# Patient Record
Sex: Male | Born: 1965
Health system: Southern US, Community
[De-identification: ages and names within clinical notes are randomized; demographics above are authoritative.]

## PROBLEM LIST (undated history)

## (undated) DIAGNOSIS — I10 Essential (primary) hypertension: Secondary | ICD-10-CM

## (undated) DIAGNOSIS — G8929 Other chronic pain: Secondary | ICD-10-CM

## (undated) DIAGNOSIS — N200 Calculus of kidney: Secondary | ICD-10-CM

## (undated) DIAGNOSIS — M109 Gout, unspecified: Secondary | ICD-10-CM

## (undated) HISTORY — DX: Essential (primary) hypertension: I10

## (undated) HISTORY — PX: TONSILLECTOMY AND ADENOIDECTOMY: SUR1326

## (undated) HISTORY — DX: Other chronic pain: G89.29

---

## 2010-06-15 ENCOUNTER — Emergency Department (HOSPITAL_COMMUNITY)
Admission: EM | Admit: 2010-06-15 | Discharge: 2010-06-15 | Disposition: A | Discharge status: Home / Self Care | Payer: Self-pay | Attending: Emergency Medicine | Admitting: Emergency Medicine

## 2010-06-15 DIAGNOSIS — J069 Acute upper respiratory infection, unspecified: Secondary | ICD-10-CM | POA: Insufficient documentation

## 2010-06-15 DIAGNOSIS — R059 Cough, unspecified: Secondary | ICD-10-CM | POA: Insufficient documentation

## 2010-06-15 DIAGNOSIS — R110 Nausea: Secondary | ICD-10-CM | POA: Insufficient documentation

## 2010-06-15 DIAGNOSIS — R05 Cough: Secondary | ICD-10-CM | POA: Insufficient documentation

## 2010-08-19 ENCOUNTER — Emergency Department (HOSPITAL_COMMUNITY): Payer: Self-pay

## 2010-08-19 ENCOUNTER — Emergency Department (HOSPITAL_COMMUNITY)
Admission: EM | Admit: 2010-08-19 | Discharge: 2010-08-19 | Disposition: A | Discharge status: Home / Self Care | Payer: Self-pay | Attending: Emergency Medicine | Admitting: Emergency Medicine

## 2010-08-19 DIAGNOSIS — R079 Chest pain, unspecified: Secondary | ICD-10-CM | POA: Insufficient documentation

## 2010-08-19 DIAGNOSIS — J40 Bronchitis, not specified as acute or chronic: Secondary | ICD-10-CM | POA: Insufficient documentation

## 2010-08-19 DIAGNOSIS — F411 Generalized anxiety disorder: Secondary | ICD-10-CM | POA: Insufficient documentation

## 2010-08-19 DIAGNOSIS — M109 Gout, unspecified: Secondary | ICD-10-CM | POA: Insufficient documentation

## 2010-08-19 DIAGNOSIS — M546 Pain in thoracic spine: Secondary | ICD-10-CM | POA: Insufficient documentation

## 2010-08-19 LAB — CBC
HCT: 45 % (ref 39.0–52.0)
Hemoglobin: 15.4 g/dL (ref 13.0–17.0)
MCH: 29.8 pg (ref 26.0–34.0)
RBC: 5.17 MIL/uL (ref 4.22–5.81)

## 2010-08-19 LAB — DIFFERENTIAL
Basophils Relative: 0 % (ref 0–1)
Lymphocytes Relative: 29 % (ref 12–46)
Lymphs Abs: 2.2 10*3/uL (ref 0.7–4.0)
Monocytes Relative: 6 % (ref 3–12)
Neutro Abs: 4.7 10*3/uL (ref 1.7–7.7)
Neutrophils Relative %: 63 % (ref 43–77)

## 2010-08-19 LAB — COMPREHENSIVE METABOLIC PANEL
ALT: 37 U/L (ref 0–53)
Alkaline Phosphatase: 74 U/L (ref 39–117)
BUN: 16 mg/dL (ref 6–23)
CO2: 28 mEq/L (ref 19–32)
Calcium: 9.5 mg/dL (ref 8.4–10.5)
GFR calc Af Amer: >60 mL/min (ref >60)
GFR calc non Af Amer: >60 mL/min (ref >60)
Glucose, Bld: 117 mg/dL — ABNORMAL HIGH (ref 70–99)
Sodium: 136 mEq/L (ref 135–145)

## 2010-08-19 LAB — CK TOTAL AND CKMB (NOT AT ARMC): Relative Index: INVALID (ref 0.0–2.5)

## 2013-10-09 ENCOUNTER — Ambulatory Visit (INDEPENDENT_AMBULATORY_CARE_PROVIDER_SITE_OTHER): Payer: BC Managed Care – PPO | Admitting: Licensed Clinical Social Worker

## 2013-10-09 DIAGNOSIS — F331 Major depressive disorder, recurrent, moderate: Secondary | ICD-10-CM

## 2013-10-09 DIAGNOSIS — F411 Generalized anxiety disorder: Secondary | ICD-10-CM

## 2013-10-16 ENCOUNTER — Ambulatory Visit (INDEPENDENT_AMBULATORY_CARE_PROVIDER_SITE_OTHER): Payer: BC Managed Care – PPO | Admitting: Licensed Clinical Social Worker

## 2013-10-16 DIAGNOSIS — F411 Generalized anxiety disorder: Secondary | ICD-10-CM

## 2013-10-16 DIAGNOSIS — F331 Major depressive disorder, recurrent, moderate: Secondary | ICD-10-CM

## 2013-11-06 ENCOUNTER — Ambulatory Visit (INDEPENDENT_AMBULATORY_CARE_PROVIDER_SITE_OTHER): Payer: BC Managed Care – PPO | Admitting: Licensed Clinical Social Worker

## 2013-11-06 DIAGNOSIS — F411 Generalized anxiety disorder: Secondary | ICD-10-CM

## 2013-11-06 DIAGNOSIS — F331 Major depressive disorder, recurrent, moderate: Secondary | ICD-10-CM

## 2013-11-17 ENCOUNTER — Ambulatory Visit: Payer: BC Managed Care – PPO | Admitting: Family

## 2013-11-20 ENCOUNTER — Ambulatory Visit: Payer: BC Managed Care – PPO | Admitting: Licensed Clinical Social Worker

## 2014-11-15 ENCOUNTER — Encounter: Payer: Self-pay | Admitting: Family Medicine

## 2014-11-15 NOTE — Progress Notes (Signed)
Office Note 11/15/2014  CC: No chief complaint on file.  HPI:  William Vincent is a 49 y.o.  male who is here to establish care. Patient's most recent primary MD:  Old records were not reviewed prior to or during today's visit.  No past medical history on file.  No past surgical history on file.  No family history on file.  Social History   Social History   Marital Status: Single    Spouse Name: N/A   Number of Children: N/A   Years of Education: N/A   Occupational History   Not on file.   Social History Main Topics   Smoking status: Not on file   Smokeless tobacco: Not on file   Alcohol Use: Not on file   Drug Use: Not on file   Sexual Activity: Not on file   Other Topics Concern   Not on file   Social History Narrative   No narrative on file    No outpatient encounter prescriptions on file as of 11/15/2014.   No facility-administered encounter medications on file as of 11/15/2014.    Allergies not on file  ROS  PE; There were no vitals taken for this visit.  Pertinent labs:    ASSESSMENT AND PLAN:   No problem-specific assessment & plan notes found for this encounter.   No Follow-up on file.

## 2015-04-15 ENCOUNTER — Encounter (HOSPITAL_COMMUNITY): Payer: Self-pay | Admitting: Emergency Medicine

## 2015-04-15 ENCOUNTER — Emergency Department (HOSPITAL_COMMUNITY)
Admission: EM | Admit: 2015-04-15 | Discharge: 2015-04-15 | Disposition: A | Discharge status: Home / Self Care | Payer: Worker's Compensation | Attending: Emergency Medicine | Admitting: Emergency Medicine

## 2015-04-15 DIAGNOSIS — M79604 Pain in right leg: Secondary | ICD-10-CM | POA: Diagnosis present

## 2015-04-15 DIAGNOSIS — M5416 Radiculopathy, lumbar region: Secondary | ICD-10-CM | POA: Insufficient documentation

## 2015-04-15 DIAGNOSIS — Z87442 Personal history of urinary calculi: Secondary | ICD-10-CM | POA: Diagnosis not present

## 2015-04-15 HISTORY — DX: Calculus of kidney: N20.0

## 2015-04-15 HISTORY — DX: Gout, unspecified: M10.9

## 2015-04-15 MED ORDER — KETOROLAC TROMETHAMINE 60 MG/2ML IM SOLN
60.0000 mg | Freq: Once | INTRAMUSCULAR | Status: AC
  Administered 2015-04-15: 60 mg via INTRAMUSCULAR
  Filled 2015-04-15: qty 2

## 2015-04-15 MED ORDER — MELOXICAM 7.5 MG PO TABS: 7.5000 mg | ORAL_TABLET | Freq: Every day | ORAL | Status: DC

## 2015-04-15 MED ORDER — PREDNISONE 10 MG (21) PO TBPK
10.0000 mg | ORAL_TABLET | Freq: Every day | ORAL | Status: DC
Start: 2015-04-15 — End: 2015-04-22

## 2015-04-15 MED ORDER — METHOCARBAMOL 500 MG PO TABS: 500.0000 mg | ORAL_TABLET | Freq: Three times a day (TID) | ORAL | Status: DC | PRN

## 2015-04-15 NOTE — ED Provider Notes (Signed)
CSN: [id number]     Arrival date & time 04/15/15  1452 History  By signing my name below, I, Octavia Heir, attest that this documentation has been prepared under the direction and in the presence of Linn Goetze, PA-C. Electronically Signed: Octavia Heir, ED Scribe. 04/15/2015. 4:45 PM.    Chief Complaint  Patient presents with  . Leg Pain      The history is provided by the patient. No language interpreter was used.   HPI Comments: William Vincent is a 50 y.o. male who presents to the Emergency Department complaining of a constant, gradual worsening, moderate, tingling right leg pain with associated right sided back soreness onset this afternoon. 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 The tingling is located around the right hip into the right anterior thigh only.  Pt ambulates with a limp and reports tingling when he walks. He has not taken any medication to alleviate the pain. Denies numbness, weakness, abdominal pain, bowel/bladder incontinence, fevers, chills, and hx of back problems.  Past Medical History  Diagnosis Date  . Gout   . Kidney stones    Past Surgical History  Procedure Laterality Date  . Tonsillectomy and adenoidectomy     No family history on file. Social History  Substance Use Topics  . Smoking status: Never Smoker   . Smokeless tobacco: Not on file  . Alcohol Use: No    Review of Systems  Constitutional: Negative for fever and chills.  Gastrointestinal: Negative for abdominal pain.  Musculoskeletal: Positive for back pain.  Neurological: Negative for weakness and numbness.  All other systems reviewed and are negative.     Allergies  Review of patient's allergies indicates no known allergies.  Home Medications   Prior to Admission medications   Not on File   Triage vitals: BP 134/104 mmHg  Pulse 90  Temp(Src) 98.1 F  (36.7 C) (Oral)  Resp 18  SpO2 99% Physical Exam  Constitutional: He appears well-developed and well-nourished. No distress.  HENT:  Head: Normocephalic and atraumatic.  Neck: Neck supple.  Pulmonary/Chest: Effort normal.  Abdominal: Soft. He exhibits no distension. There is no tenderness. There is no rebound and no guarding.  Musculoskeletal:       Back:  Spine nontender, no crepitus, or stepoffs. Lower extremities:  Strength 5/5, sensation intact, distal pulses intact. Tenderness of right lower lumbar area.  No skin changes.    Neurological: He is alert.  Skin: He is not diaphoretic.  Nursing note and vitals reviewed.   ED Course  Procedures  DIAGNOSTIC STUDIES: Oxygen Saturation is 99% on RA, normal by my interpretation.  COORDINATION OF CARE:  4:44 PM Discussed treatment plan with pt at bedside and pt agreed to plan.  Labs Review Labs Reviewed - No data to display  Imaging Review No results found. I have personally reviewed and evaluated these images and lab results as part of my medical decision-making.   EKG Interpretation None      MDM   Final diagnoses:  Lumbar radiculopathy, acute    Afebrile, nontoxic patient with right lower back pain and tingling around right hip into right anterior thigh.  No weakness.  No bony tenderness of spine.  Suspect muscle strain vs pinched nerve, possible disc bulge.  Doubt significant nerve root impingement, acute cord syndrome, fracture.   D/C home with mobic, robaxin, prednisone,  close PCP follow up.   Discussed result, findings, treatment, and follow up  with patient.  Pt given return precautions.  Pt verbalizes understanding and agrees with plan.        I personally performed the services described in this documentation, which was scribed in my presence. The recorded information has been reviewed and is accurate.   Trixie Dredge, PA-C 04/15/15 2000  Arby Barrette, MD 04/22/15 [phone]

## 2015-04-15 NOTE — ED Notes (Signed)
Pt states he was lifting something heavy at work and now has R sided upper leg pain. Alert and oriented.

## 2015-04-15 NOTE — Progress Notes (Signed)
EDCM consulted to assist with primary care resources.  EDCM spoke to patient at bedside.  Patient reports he has [employer] and presented [facility] with his insurance card.  EDCM provided patient with list of pcps who accept [employer] insurance within a ten mile radius of patient's zip code.  Patient thankful for services.  No further EDCM needs at this time.

## 2015-04-15 NOTE — Discharge Instructions (Signed)
Read the information below.  Use the prescribed medication as directed.  Please discuss all new medications with your pharmacist.  You may return to the Emergency Department at any time for worsening condition or any new symptoms that concern you.     If you develop fevers, loss of control of bowel or bladder, weakness or numbness in your legs, or are unable to walk, return to the ER for a recheck.   Please follow up with your primary care provider as soon as possible for a follow up appointment.      Lumbosacral Radiculopathy Lumbosacral radiculopathy is a condition that involves the spinal nerves and nerve roots in the low back and bottom of the spine. The condition develops when these nerves and nerve roots move out of place or become inflamed and cause symptoms. CAUSES This condition may be caused by:  Pressure from a disk that bulges out of place (herniated disk). A disk is a plate of cartilage that separates bones in the spine.  Disk degeneration.  A narrowing of the bones of the lower back (spinal stenosis).  A tumor.  An infection.  An injury that places sudden pressure on the disks that cushion the bones of your lower spine. RISK FACTORS This condition is more likely to develop in:  Males aged 30-50 years.  Females aged 50-60 years.  People who lift improperly.  People who are overweight or live a sedentary lifestyle.  People who smoke.  People who perform repetitive activities that strain the spine. SYMPTOMS Symptoms of this condition include:  Pain that goes down from the back into the legs (sciatica). This is the most common symptom. The pain may be worse with sitting, coughing, or sneezing.  Pain and numbness in the arms and legs.  Muscle weakness.  Tingling.  Loss of bladder control or bowel control. DIAGNOSIS This condition is diagnosed with a physical exam and medical history. If the pain is lasting, you may have tests, such as:  MRI  scan.  X-ray.  CT scan.  Myelogram.  Nerve conduction study. TREATMENT This condition is often treated with:  Hot packs and ice applied to affected areas.  Stretches to improve flexibility.  Exercises to strengthen back muscles.  Physical therapy.  Pain medicine.  A steroid injection in the spine. In some cases, no treatment is needed. If the condition is long-lasting (chronic), or if symptoms are severe, treatment may involve surgery or lifestyle changes, such as following a weight loss plan. HOME CARE INSTRUCTIONS Medicines  Take medicines only as directed by your health care provider.  Do not drive or operate heavy machinery while taking pain medicine. Injury Care  Apply a heat pack to the injured area as directed by your health care provider.  Apply ice to the affected area:  Put ice in a plastic bag.  Place a towel between your skin and the bag.  Leave the ice on for 20-30 minutes, every 2 hours while you are awake or as needed. Or, leave the ice on for as long as directed by your health care provider. Other Instructions  If you were shown how to do any exercises or stretches, do them as directed by your health care provider.  If your health care provider prescribed a diet or exercise program, follow it as directed.  Keep all follow-up visits as directed by your health care provider. This is important. SEEK MEDICAL CARE IF:  Your pain does not improve over time even when taking pain medicines.  SEEK IMMEDIATE MEDICAL CARE IF:  Your develop severe pain.  Your pain suddenly gets worse.  You develop increasing weakness in your legs.  You lose the ability to control your bladder or bowel.  You have difficulty walking or balancing.  You have a fever.   This information is not intended to replace advice given to you by your health care provider. Make sure you discuss any questions you have with your health care provider.   Document Released: 02/23/2005  Document Revised: 07/10/2014 Document Reviewed: 02/19/2014 Elsevier Interactive Patient Education [employer]

## 2015-04-16 ENCOUNTER — Ambulatory Visit (INDEPENDENT_AMBULATORY_CARE_PROVIDER_SITE_OTHER): Payer: BLUE CROSS/BLUE SHIELD | Admitting: Family Medicine

## 2015-04-16 ENCOUNTER — Encounter: Payer: Self-pay | Admitting: Family Medicine

## 2015-04-16 VITALS — BP 174/95 | HR 89 | Wt 210.0 lb

## 2015-04-16 DIAGNOSIS — M5416 Radiculopathy, lumbar region: Secondary | ICD-10-CM

## 2015-04-16 DIAGNOSIS — R030 Elevated blood-pressure reading, without diagnosis of hypertension: Secondary | ICD-10-CM | POA: Diagnosis not present

## 2015-04-16 DIAGNOSIS — IMO0001 Reserved for inherently not codable concepts without codable children: Secondary | ICD-10-CM

## 2015-04-16 MED ORDER — HYDROCODONE-ACETAMINOPHEN 5-325 MG PO TABS: 1.0000 | ORAL_TABLET | Freq: Four times a day (QID) | ORAL | Status: DC | PRN

## 2015-04-16 NOTE — Patient Instructions (Signed)
Thank you for coming in today. Come back or go to the emergency room if you notice new weakness new numbness problems walking or bowel or bladder problems. Return sooner if needed.  Return on Friday.   Get fasting labs.   Sciatica Sciatica is pain, weakness, numbness, or tingling along the path of the sciatic nerve. The nerve starts in the lower back and runs down the back of each leg. The nerve controls the muscles in the lower leg and in the back of the knee, while also providing sensation to the back of the thigh, lower leg, and the sole of your foot. Sciatica is a symptom of another medical condition. For instance, nerve damage or certain conditions, such as a herniated disk or bone spur on the spine, pinch or put pressure on the sciatic nerve. This causes the pain, weakness, or other sensations normally associated with sciatica. Generally, sciatica only affects one side of the body. CAUSES   Herniated or slipped disc.  Degenerative disk disease.  A pain disorder involving the narrow muscle in the buttocks (piriformis syndrome).  Pelvic injury or fracture.  Pregnancy.  Tumor (rare). SYMPTOMS  Symptoms can vary from mild to very severe. The symptoms usually travel from the low back to the buttocks and down the back of the leg. Symptoms can include:  Mild tingling or dull aches in the lower back, leg, or hip.  Numbness in the back of the calf or sole of the foot.  Burning sensations in the lower back, leg, or hip.  Sharp pains in the lower back, leg, or hip.  Leg weakness.  Severe back pain inhibiting movement. These symptoms may get worse with coughing, sneezing, laughing, or prolonged sitting or standing. Also, being overweight may worsen symptoms. DIAGNOSIS  Your caregiver will perform a physical exam to look for common symptoms of sciatica. He or she may ask you to do certain movements or activities that would trigger sciatic nerve pain. Other tests may be performed to  find the cause of the sciatica. These may include:  Blood tests.  X-rays.  Imaging tests, such as an MRI or CT scan. TREATMENT  Treatment is directed at the cause of the sciatic pain. Sometimes, treatment is not necessary and the pain and discomfort goes away on its own. If treatment is needed, your caregiver may suggest:  Over-the-counter medicines to relieve pain.  Prescription medicines, such as anti-inflammatory medicine, muscle relaxants, or narcotics.  Applying heat or ice to the painful area.  Steroid injections to lessen pain, irritation, and inflammation around the nerve.  Reducing activity during periods of pain.  Exercising and stretching to strengthen your abdomen and improve flexibility of your spine. Your caregiver may suggest losing weight if the extra weight makes the back pain worse.  Physical therapy.  Surgery to eliminate what is pressing or pinching the nerve, such as a bone spur or part of a herniated disk. HOME CARE INSTRUCTIONS   Only take over-the-counter or prescription medicines for pain or discomfort as directed by your caregiver.  Apply ice to the affected area for 20 minutes, 3-4 times a day for the first 48-72 hours. Then try heat in the same way.  Exercise, stretch, or perform your usual activities if these do not aggravate your pain.  Attend physical therapy sessions as directed by your caregiver.  Keep all follow-up appointments as directed by your caregiver.  Do not wear high heels or shoes that do not provide proper support.  Check your mattress  to see if it is too soft. A firm mattress may lessen your pain and discomfort. SEEK IMMEDIATE MEDICAL CARE IF:   You lose control of your bowel or bladder (incontinence).  You have increasing weakness in the lower back, pelvis, buttocks, or legs.  You have redness or swelling of your back.  You have a burning sensation when you urinate.  You have pain that gets worse when you lie down or  awakens you at night.  Your pain is worse than you have experienced in the past.  Your pain is lasting longer than 4 weeks.  You are suddenly losing weight without reason. MAKE SURE YOU:  Understand these instructions.  Will watch your condition.  Will get help right away if you are not doing well or get worse.   This information is not intended to replace advice given to you by your health care provider. Make sure you discuss any questions you have with your health care provider.   Document Released: 02/17/2001 Document Revised: 11/14/2014 Document Reviewed: 07/05/2011 Elsevier Interactive Patient Education [employer]

## 2015-04-17 NOTE — Assessment & Plan Note (Signed)
Patient has symptoms consistent with right-sided lumbar radiculopathy. His exam is abnormal slightly with a change in reflexes and some clonus. He may have a higher lumbar compression that would explain his symptoms. He seems to be doing well and his symptoms have not worsened acutely. Plan for careful discussion of red flag signs or symptoms, continued steroid management, and hydrocodone. Follow-up in 3 days. If not improving or if worsening would consider getting an MRI at that time.

## 2015-04-17 NOTE — Progress Notes (Signed)
       William Vincent is a 50 y.o. male who presents to [facility]: Primary Care today for establish care and discuss right-sided back pain and leg pain. Patient notes a 2 or 3 day history of back pain radiating to the right anterior thigh. He notes some paresthesias into the right foot. He denies significant weakness bowel bladder dysfunction fevers chills nausea vomiting or diarrhea. He was seen in the emergency department on February 6 where he was prescribed meloxicam, Robaxin, and prednisone. This has helped only a little. 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 He notes the pain started after he lifted a heavy object but denies any specific injury.   Past Medical History  Diagnosis Date  . Gout   . Kidney stones   . Hypertension    Past Surgical History  Procedure Laterality Date  . Tonsillectomy and adenoidectomy     Social History  Substance Use Topics  . Smoking status: Never Smoker   . Smokeless tobacco: Not on file  . Alcohol Use: No   family history is not on file.  ROS as above Medications: Current Outpatient Prescriptions  Medication Sig Dispense Refill  . meloxicam (MOBIC) 7.5 MG tablet Take 1 tablet (7.5 mg total) by mouth daily. 14 tablet 0  . methocarbamol (ROBAXIN) 500 MG tablet Take 1-2 tablets (500-1,000 mg total) by mouth every 8 (eight) hours as needed for muscle spasms (and pain). 20 tablet 0  . predniSONE (STERAPRED UNI-PAK 21 TAB) 10 MG (21) TBPK tablet Take 1 tablet (10 mg total) by mouth daily. Day 1: take 6 tabs.  Day 2: 5 tabs  Day 3: 4 tabs  Day 4: 3 tabs  Day 5: 2 tabs  Day 6: 1 tab 21 tablet 0  . HYDROcodone-acetaminophen (NORCO/VICODIN) 5-325 MG tablet Take 1 tablet by mouth every 6 (six) hours as needed. 15 tablet 0   No current facility-administered medications for this visit.   No Known Allergies   Exam:  BP 174/95 mmHg  Pulse 89  Wt 210 lb (95.255  kg) Gen: Well NAD HEENT: EOMI,  MMM Lungs: Normal work of breathing. CTABL Heart: RRR no MRG Abd: NABS, Soft. Nondistended, Nontender Exts: Brisk capillary refill, warm and well perfused.  Back is nontender to spinal midline. Mildly tender to palpation right SI joint. Lower extremity sensation is intact throughout. Knee reflexes are equal and normal bilaterally. Ankle reflexes are increased with a few beats of clonus on the right compared to the left. Strength is preserved and intact throughout. Patient can stand and his toes heels squat get on and off exam table and has a normal gait. Positive right-sided slumped test.  No results found for this or any previous visit (from the past 24 hour(s)). No results found.   Please see individual assessment and plan sections.

## 2015-04-17 NOTE — Assessment & Plan Note (Signed)
Blood pressure is elevated likely due to pain and meloxicam and prednisone. Plan to obtain routine fasting labs and recheck in a few days.

## 2015-04-18 ENCOUNTER — Encounter: Payer: Self-pay | Admitting: Family Medicine

## 2015-04-18 ENCOUNTER — Ambulatory Visit (INDEPENDENT_AMBULATORY_CARE_PROVIDER_SITE_OTHER): Payer: BLUE CROSS/BLUE SHIELD | Admitting: Family Medicine

## 2015-04-18 VITALS — BP 147/87 | HR 80 | Wt 216.0 lb

## 2015-04-18 DIAGNOSIS — M5416 Radiculopathy, lumbar region: Secondary | ICD-10-CM | POA: Diagnosis not present

## 2015-04-18 LAB — COMPREHENSIVE METABOLIC PANEL
ALBUMIN: 3.7 g/dL (ref 3.6–5.1)
ALT: 10 U/L (ref 9–46)
AST: 10 U/L (ref 10–40)
Alkaline Phosphatase: 53 U/L (ref 40–115)
BILIRUBIN TOTAL: 0.4 mg/dL (ref 0.2–1.2)
BUN: 16 mg/dL (ref 7–25)
CHLORIDE: 101 mmol/L (ref 98–110)
CO2: 24 mmol/L (ref 20–31)
CREATININE: 1.02 mg/dL (ref 0.60–1.35)
Calcium: 8.8 mg/dL (ref 8.6–10.3)
Glucose, Bld: 83 mg/dL (ref 65–99)
Potassium: 4.5 mmol/L (ref 3.5–5.3)
SODIUM: 138 mmol/L (ref 135–146)
Total Protein: 6.4 g/dL (ref 6.1–8.1)

## 2015-04-18 LAB — CBC
HCT: 44.4 % (ref 39.0–52.0)
HEMOGLOBIN: 14.5 g/dL (ref 13.0–17.0)
MCH: 28.8 pg (ref 26.0–34.0)
MCHC: 32.7 g/dL (ref 30.0–36.0)
MCV: 88.3 fL (ref 78.0–100.0)
MPV: 9.7 fL (ref 8.6–12.4)
PLATELETS: 293 10*3/uL (ref 150–400)
RBC: 5.03 MIL/uL (ref 4.22–5.81)
RDW: 14.9 % (ref 11.5–15.5)
WBC: 11.4 10*3/uL — AB (ref 4.0–10.5)

## 2015-04-18 LAB — TSH: TSH: 2.44 m[IU]/L (ref 0.40–4.50)

## 2015-04-18 LAB — LIPID PANEL
Cholesterol: 164 mg/dL (ref 125–200)
HDL: 57 mg/dL (ref >=40)
LDL CALC: 88 mg/dL (ref <130)
Total CHOL/HDL Ratio: 2.9 Ratio (ref <=5.0)
Triglycerides: 95 mg/dL (ref <150)
VLDL: 19 mg/dL (ref <30)

## 2015-04-18 MED ORDER — LORAZEPAM 0.5 MG PO TABS
ORAL_TABLET | ORAL | Status: DC
Start: 2015-04-18 — End: 2015-04-24

## 2015-04-18 NOTE — Progress Notes (Addendum)
       William Vincent is a 50 y.o. male who presents to [name] today for low back pain.  Patient seen 2 days ago for lumbago radiating to the R anterior thigh with R sided clonus thought to be due to R sided lumbar radiculopathy. Since then, patient has had worsening pain despite taking anti-inflammatories and pain medication. He noted bilateral cyanosis of his feet this AM which prompted coming in. He denies bowel and bladder issues. Denies numbness and paresthesias.    Past Medical History  Diagnosis Date  . Gout   . Kidney stones   . Hypertension    Past Surgical History  Procedure Laterality Date  . Tonsillectomy and adenoidectomy     Social History  Substance Use Topics  . Smoking status: Never Smoker   . Smokeless tobacco: Not on file  . Alcohol Use: No   family history is not on file.  ROS as above Medications: Current Outpatient Prescriptions  Medication Sig Dispense Refill  . HYDROcodone-acetaminophen (NORCO/VICODIN) 5-325 MG tablet Take 1 tablet by mouth every 6 (six) hours as needed. 15 tablet 0  . meloxicam (MOBIC) 7.5 MG tablet Take 1 tablet (7.5 mg total) by mouth daily. 14 tablet 0  . methocarbamol (ROBAXIN) 500 MG tablet Take 1-2 tablets (500-1,000 mg total) by mouth every 8 (eight) hours as needed for muscle spasms (and pain). 20 tablet 0  . predniSONE (STERAPRED UNI-PAK 21 TAB) 10 MG (21) TBPK tablet Take 1 tablet (10 mg total) by mouth daily. Day 1: take 6 tabs.  Day 2: 5 tabs  Day 3: 4 tabs  Day 4: 3 tabs  Day 5: 2 tabs  Day 6: 1 tab 21 tablet 0   No current facility-administered medications for this visit.   No Known Allergies   Exam:  BP 147/87 mmHg  Pulse 80  Wt 216 lb (97.977 kg) Gen: Uncomfortable appearing gentleman in NAD HEENT: EOMI,  MMM Lungs: Normal work of breathing. CTABL Heart: RRR no MRG Abd: NABS, Soft. Nondistended, Nontender Exts:  Brisk capillary refill, warm and well perfused. PT and DP pulses 2+ bilaterally.   MSK No visual or palpable deformity of back. Nontender.   Full ROM of LE Strength 5/5 bilaterally with thigh flexion and extension, abduction/adduction. Plantarflexion and standing on heels while standing 5/5 strength  Reflexes equal and normal bilateral knees and ankles. Negative for clonus bilaterally though some R foot twitching.  Antalgic gait favoring the left side.   No results found for this or any previous visit (from the past 24 hour(s)). No results found.   Please see individual assessment and plan sections.    LUMBAR MRI NORMAL

## 2015-04-18 NOTE — Patient Instructions (Addendum)
Thank you for coming in today. You were seen today for continued back pain and weakness. We will order an MRI to look at your lower back. Please come back after the MRI.  We will call with results.

## 2015-04-18 NOTE — Assessment & Plan Note (Addendum)
Acute, worsening. Patient is doing worse since being seen two days ago. Next step is lumbar MRI to evaluate for spinal cord or alternative pathology causing the current problems. Follow up after MRI.

## 2015-04-19 ENCOUNTER — Ambulatory Visit: Payer: Self-pay | Admitting: Family Medicine

## 2015-04-19 LAB — HEMOGLOBIN A1C
HEMOGLOBIN A1C: 5.4 % (ref <5.7)
MEAN PLASMA GLUCOSE: 108 mg/dL (ref <117)

## 2015-04-19 LAB — VITAMIN D 25 HYDROXY (VIT D DEFICIENCY, FRACTURES): VIT D 25 HYDROXY: 15 ng/mL — AB (ref 30–100)

## 2015-04-19 NOTE — Addendum Note (Signed)
Addended by: Rodolph Bong on: 04/19/2015 12:35 PM   Modules accepted: Kipp Brood

## 2015-04-19 NOTE — Progress Notes (Signed)
Quick Note:  Vitamin D deficiency noted. Take 2000 units of vitamin D daily over-the-counter.   ______ 

## 2015-04-22 ENCOUNTER — Ambulatory Visit (INDEPENDENT_AMBULATORY_CARE_PROVIDER_SITE_OTHER): Payer: BLUE CROSS/BLUE SHIELD

## 2015-04-22 ENCOUNTER — Ambulatory Visit (INDEPENDENT_AMBULATORY_CARE_PROVIDER_SITE_OTHER): Payer: Self-pay | Admitting: Family Medicine

## 2015-04-22 ENCOUNTER — Encounter: Payer: Self-pay | Admitting: Family Medicine

## 2015-04-22 VITALS — BP 141/96 | HR 77 | Wt 214.0 lb

## 2015-04-22 DIAGNOSIS — M5030 Other cervical disc degeneration, unspecified cervical region: Secondary | ICD-10-CM

## 2015-04-22 DIAGNOSIS — M5416 Radiculopathy, lumbar region: Secondary | ICD-10-CM

## 2015-04-22 MED ORDER — BACLOFEN 10 MG PO TABS: 10.0000 mg | ORAL_TABLET | Freq: Three times a day (TID) | ORAL | Status: DC

## 2015-04-22 NOTE — Assessment & Plan Note (Signed)
Unclear etiology. Possible cervical thoracic spine cause. Patient initially had some clonus which may be due to myelopathy in the cervical spine. We'll obtain a cervical spine MRI. Additionally will refer to physical therapy. Continue regimen. Follow-up in a few days. Red flag signs or symptoms reviewed with patient who expresses understanding and agreement. Switched baclofen.

## 2015-04-22 NOTE — Progress Notes (Signed)
       William Vincent is a 50 y.o. male who presents to [name] Care today for follow-up back pain. Patient was seen last week with severe back pain. He had pain radiating to his right leg with weakness. In the interim he's had a lumbar MRI that was completely normal. His symptoms persisted and have worsened. He notes continued right leg weakness. He uses a cane to ambulate. He denies any bowel or bladder dysfunction. He continues the below medication. He notes that the Robaxin has not been helpful and would like to try different muscle relaxer.   Past Medical History  Diagnosis Date  . Gout   . Kidney stones   . Hypertension    Past Surgical History  Procedure Laterality Date  . Tonsillectomy and adenoidectomy     Social History  Substance Use Topics  . Smoking status: Never Smoker   . Smokeless tobacco: Not on file  . Alcohol Use: No   family history is not on file.  ROS as above Medications: Current Outpatient Prescriptions  Medication Sig Dispense Refill  . baclofen (LIORESAL) 10 MG tablet Take 1 tablet (10 mg total) by mouth 3 (three) times daily. 30 each 0  . HYDROcodone-acetaminophen (NORCO/VICODIN) 5-325 MG tablet Take 1 tablet by mouth every 6 (six) hours as needed. 15 tablet 0  . LORazepam (ATIVAN) 0.5 MG tablet 1-2 tabs 30-60 mins prior to mri 2 tablet 0  . meloxicam (MOBIC) 7.5 MG tablet Take 1 tablet (7.5 mg total) by mouth daily. 14 tablet 0   No current facility-administered medications for this visit.   No Known Allergies   Exam:  BP 141/96 mmHg  Pulse 77  Wt 214 lb (97.07 kg) Gen: Well NAD Back nontender to midline. Tender to palpation bilateral lumbar paraspinal muscles. Significantly limited back range of motion due to pain. Positive right-sided slump test negative left. Lower extremity reflexes are equal and normal bilaterally knees and ankles. No  clonus. Lower extremity strength is intact. Patient is able to stand on toes heels andsquat. He walks with a cane. Sensation is intact throughout. Neck nontender. Normal flexion and extension. Impaired lateral flexion bilaterally. Negative Spurling's test. Back pain exacerbated by neck motion. Upper extremity strength sensation reflexes are equal and normal bilaterally.  No results found for this or any previous visit (from the past 24 hour(s)). No results found.   Please see individual assessment and plan sections.

## 2015-04-22 NOTE — Patient Instructions (Signed)
Thank you for coming in today. Return later this week following MRI.  Take baclofen for muscle spasm as needed.

## 2015-04-23 NOTE — Progress Notes (Signed)
Quick Note:  MRI cspine looks ok ______

## 2015-04-24 ENCOUNTER — Ambulatory Visit (INDEPENDENT_AMBULATORY_CARE_PROVIDER_SITE_OTHER): Payer: BLUE CROSS/BLUE SHIELD | Admitting: Rehabilitative and Restorative Service Providers"

## 2015-04-24 ENCOUNTER — Ambulatory Visit (INDEPENDENT_AMBULATORY_CARE_PROVIDER_SITE_OTHER): Payer: BLUE CROSS/BLUE SHIELD | Admitting: Family Medicine

## 2015-04-24 ENCOUNTER — Encounter: Payer: Self-pay | Admitting: Family Medicine

## 2015-04-24 ENCOUNTER — Ambulatory Visit (INDEPENDENT_AMBULATORY_CARE_PROVIDER_SITE_OTHER): Payer: BLUE CROSS/BLUE SHIELD

## 2015-04-24 ENCOUNTER — Encounter: Payer: Self-pay | Admitting: Rehabilitative and Restorative Service Providers"

## 2015-04-24 VITALS — BP 138/92 | HR 72 | Wt 214.0 lb

## 2015-04-24 DIAGNOSIS — M2560 Stiffness of unspecified joint, not elsewhere classified: Secondary | ICD-10-CM

## 2015-04-24 DIAGNOSIS — M79604 Pain in right leg: Secondary | ICD-10-CM

## 2015-04-24 DIAGNOSIS — Z7409 Other reduced mobility: Secondary | ICD-10-CM

## 2015-04-24 DIAGNOSIS — M25551 Pain in right hip: Secondary | ICD-10-CM | POA: Diagnosis not present

## 2015-04-24 DIAGNOSIS — R293 Abnormal posture: Secondary | ICD-10-CM

## 2015-04-24 DIAGNOSIS — M623 Immobility syndrome (paraplegic): Secondary | ICD-10-CM | POA: Diagnosis not present

## 2015-04-24 DIAGNOSIS — M5416 Radiculopathy, lumbar region: Secondary | ICD-10-CM | POA: Diagnosis not present

## 2015-04-24 DIAGNOSIS — M25559 Pain in unspecified hip: Secondary | ICD-10-CM | POA: Insufficient documentation

## 2015-04-24 DIAGNOSIS — R531 Weakness: Secondary | ICD-10-CM

## 2015-04-24 MED ORDER — HYDROCODONE-ACETAMINOPHEN 10-325 MG PO TABS: 1.0000 | ORAL_TABLET | Freq: Three times a day (TID) | ORAL | Status: DC | PRN

## 2015-04-24 MED ORDER — LORAZEPAM 0.5 MG PO TABS: ORAL_TABLET | ORAL | Status: DC

## 2015-04-24 NOTE — Patient Instructions (Signed)
Take norco for severe pain.  Get hip MRI.  Return following MRI with image CD.  Call or go to the ER if you develop a large red swollen joint with extreme pain or oozing puss.

## 2015-04-24 NOTE — Progress Notes (Signed)
William Vincent is a 50 y.o. male who presents to [name]: Primary Care today for follow-up leg pain. Patient has had back pain and right leg radiating pain for the last several days. He has been worked up so far with this with a lumbar MRI at [facility] that was normal and a cervical MRI that was also relatively normal. His symptoms persist and are severe. He notes severe right low back pain and right groin pain. The pain radiates down the anterior medial thigh down the anterior and medial lower leg and into the plantar foot. He notes numbness of both feet especially when he sits. He denies any new weakness or bowel bladder dysfunction. He has run out of Norco for which he takes for pain. No fevers or chills vomiting or diarrhea.   Past Medical History  Diagnosis Date  . Gout   . Kidney stones   . Hypertension    Past Surgical History  Procedure Laterality Date  . Tonsillectomy and adenoidectomy     Social History  Substance Use Topics  . Smoking status: Never Smoker   . Smokeless tobacco: Not on file  . Alcohol Use: No   family history is not on file.  ROS as above Medications: Current Outpatient Prescriptions  Medication Sig Dispense Refill  . baclofen (LIORESAL) 10 MG tablet Take 1 tablet (10 mg total) by mouth 3 (three) times daily. 30 each 0  . LORazepam (ATIVAN) 0.5 MG tablet 1-2 tabs 30-60 mins prior to mri 2 tablet 0  . HYDROcodone-acetaminophen (NORCO) 10-325 MG tablet Take 1 tablet by mouth every 8 (eight) hours as needed. 40 tablet 0   No current facility-administered medications for this visit.   No Known Allergies   Exam:  BP 138/92 mmHg  Pulse 72  Wt 214 lb (97.07 kg) Gen: Well NAD Back: Nontender to spinal midline. Tender palpation right SI joint and right lumbar paraspinals. Decreased motion due to pain especially in flexion and extension. Hip tender to palpation  anterior groin without any obvious herniation. Pain with motion of the hip including flexion and extension. Capillary refill and sensation intact lower extremities. Patient has an antalgic gait but can stand on his toes heels and get on and off exam table. He interlaced with a cane. No clonus on today's exam.      Procedure: Real-time Ultrasound Guided Attempted diagnostic and therapeutic injection of right femoral acetabular joint Device: GE Logiq E  Images permanently stored and available for review in the ultrasound unit. Verbal informed consent obtained. Discussed risks and benefits of procedure. Warned about infection bleeding damage to structures skin hypopigmentation and fat atrophy among others. Patient expresses understanding and agreement Time-out conducted.  Noted no overlying erythema, induration, or other signs of local infection.  Skin prepped in a sterile fashion.  Local anesthesia: Topical Ethyl chloride.  With sterile technique and under real time ultrasound guidance:A spinal needle was advanced to the femoral acetabular joint. Patient experienced a significant amount of pain with exam was unable to tolerate the injection. The procedure was discontinued at this time.    Advised to call if fevers/chills, erythema, induration, drainage, or persistent bleeding.  Images permanently stored and available for review in the ultrasound unit.  Impression: Discontinued ultrasound guided injection due to pain.    No results found for this or any previous visit (from the past 24 hour(s)). [name]  04/22/2015  CLINICAL DATA:  Neck pain and  bilateral shoulder pain and hand pain. EXAM: MRI CERVICAL SPINE WITHOUT CONTRAST TECHNIQUE: Multiplanar, multisequence MR imaging of the cervical spine was performed. No intravenous contrast was administered. COMPARISON:  None. FINDINGS: The visualized intracranial structures and paraspinal soft tissues are normal. The  cervical spinal cord is normal with no mass lesion or myelopathy or spinal cord compression. There is no spinal or foraminal stenosis.  No facet arthritis. Craniocervical junction through C2-3:  Normal. C3-4:  Minimal desiccation of the disc.  Otherwise normal. C4-5:  Normal. C5-6:  Tiny broad-based disc bulge with no neural impingement. C6-7:  Tiny central disc bulge with no neural impingement. C7-T1:  Tiny central disc bulge with no neural impingement. T1-2:  Normal. IMPRESSION: Minimal degenerative disc disease at several levels with no disc protrusions or neural impingement. Electronically Signed   By: Francene Boyers M.D.   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 Osseous mineralization normal. No acute fracture, dislocation or bone destruction. Pelvis unremarkable. IMPRESSION: Normal exam. Electronically Signed   By: Ulyses Southward M.D.   On: 04/24/2015 10:49     Please see individual assessment and plan sections.

## 2015-04-24 NOTE — Assessment & Plan Note (Signed)
X-ray normal. MRI pending. Unclear etiology of patient's hip groin and lower extremity pain. It does not seem to be lumbar radiculopathy because patient has normal lumbar MRI at [facility]. He clearly has pain in his pelvis and I suspect he may have a hip or hip muscle rotator problem to explain his pain. He may have compression of the sciatic nerve in the hip or pelvis. Follow-up after MRI. Prescribed Norco for pain control.

## 2015-04-24 NOTE — Therapy (Signed)
[facility] [place] [address] [place], [name], [address] Phone: [phone]   Fax:  [phone]  Physical Therapy Evaluation  Patient Details  Name: William Vincent MRN: [id number] Date of Birth: December 28, 1965 Referring Provider: Dr. [name]  Encounter Date: 04/24/2015      PT End of Session - 04/24/15 0809    Visit Number 1   Number of Visits 12   Date for PT Re-Evaluation 06/05/15   PT Start Time 0809   PT Stop Time 0902   PT Time Calculation (min) 53 min   Activity Tolerance Patient limited by pain      Past Medical History  Diagnosis Date  . Gout   . Kidney stones   . Hypertension     Past Surgical History  Procedure Laterality Date  . Tonsillectomy and adenoidectomy      There were no vitals filed for this visit.  Visit Diagnosis:  Lumbar radiculopathy, acute - Plan: PT plan of care cert/re-cert  Leg pain, right - Plan: PT plan of care cert/re-cert  Stiffness due to immobility - Plan: PT plan of care cert/re-cert  Abnormal posture - Plan: PT plan of care cert/re-cert  Decreased strength, endurance, and mobility - Plan: PT plan of care cert/re-cert      Subjective Assessment - 04/24/15 0811    Subjective Patient reports that he was picking up boxes weighing ~100 lb 04/15/15 when he felt pain back and into groin. Seen by MD in ED and was treated with medicine. He was then seen by Dr. [name] 04/16/15. He did MRI on back and neck which was negative. Pain continues and seems worse in the past few days with throbbing down the back of his leg.    Pertinent History Denies any medical problems or prior back injuries or problems   How long can you sit comfortably? not at all   How long can you stand comfortably? 5-10 min    How long can you walk comfortably? not at all   Diagnostic tests MRI   Patient Stated Goals Get back into shape - get rid of pain; get back and legs right   Currently in Pain? Yes   Pain Score 7     Pain Location Back   Pain Orientation Right   Pain Descriptors / Indicators Sharp   Pain Type Acute pain   Pain Radiating Towards posterior hip/buttocks; calf; knee    Pain Onset 1 to 4 weeks ago   Pain Frequency Constant   Aggravating Factors  prolonged sitting; standing; walking; leaning; propping in bed; reaching; lifting; bending   Pain Relieving Factors meds help "a little bit" clams it down for a while            [facility] PT Assessment - 04/24/15 0001    Assessment   Medical Diagnosis Rt lumbar radiculopathy    Referring Provider Dr. [name]   Onset Date/Surgical Date 04/15/15   Hand Dominance Right   Next MD Visit 04/24/15   Prior Therapy none   Precautions   Precautions None   Balance Screen   Has the patient fallen in the past 6 months No   Has the patient had a decrease in activity level because of a fear of falling?  No   Is the patient reluctant to leave their home because of a fear of falling?  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    Leisure yard work; Psychologist, forensic for kids; Catering manager in soft couch    Observation/Other Assessments   Focus on Therapeutic Outcomes (FOTO)  100% limitation    Sensation   Additional Comments tingling into the Rt foot and toes - sitting on toilet both feet will feel numb    Posture/Postural Control   Posture Comments stands with trunk forward flexed; weight shifted to the Lt; head forward; shoulders rounded; UE's in IR at sides   AROM   Overall AROM Comments pain with all movements of bilat LE's and trunk Rt LE > Lt LE    Right/Left Hip --   limited 90d flex bilat with c/o of increased LBP Rt>Lt   Lumbar Flexion 40%   Lumbar Extension 0%   Lumbar - Right Side Bend 45%   Lumbar - Left Side Bend 40%   Lumbar - Right Rotation 5%   Lumbar - Left Rotation 5%   Strength   Overall Strength Comments unable to fully assess strength due to pain in the LB and into the Rt hip/LE functional against gravity    Flexibility   Hamstrings tight Rt 42 deg; Lt 48 deg   Palpation   Spinal mobility hypersensitive to spring testing- unable to assess   Palpation comment hypersensitity to light pressure lumbar to bilat hips and thighs posteriorly   Special Tests    Special Tests --  SLR (+) Rt with pain in the Rt LB and into the Rt post thigh   Ambulation/Gait   Gait Comments antalgic gait with SPC Lt UE with weight shifted to the Lt during standing and with ambulation. Rt LE is externally rotated and hips flexed/trunk flexed forward in standing                   [facility] Adult PT Treatment/Exercise - 04/24/15 0001    Neuro Re-ed    Neuro Re-ed Details  working on upright posture and alignment; working on lying prone - flat    Lumbar Exercises: Stretches   Standing Extension --  3 reps 2-3 sec    Cryotherapy   Number Minutes Cryotherapy 15 Minutes   Cryotherapy Location Lumbar Spine;Hip  Rt   Type of Cryotherapy Ice pack   Electrical Stimulation   Electrical Stimulation Location bilat L5 area; Rt hip    Electrical Stimulation Action IFC   Electrical Stimulation Parameters to tolerance   Electrical Stimulation Goals Pain                PT Education - 04/24/15 0844    Education provided Yes   Education Details nature of mysculoskeletal injuries; improtance of good posture and alignment; HEP; TENS info   Person(s) Educated Patient   Methods Explanation;Demonstration;Tactile cues;Verbal cues;Handout   Comprehension Verbalized understanding;Returned demonstration;Verbal cues required;Tactile cues required           PT Short Term Goals - 04/24/15 0913    PT SHORT TERM GOAL #1   Title Decrease pain by 50-70% allowing pt to particiate in exercise and ADL's 05/15/15   Time 3   Period Weeks   Status New   PT SHORT TERM GOAL #2  Title Improve posture and alignment with pt to demonstrate good upright posture and alignment 05/15/15   Time 3   Period Weeks   PT SHORT TERM GOAL #3   Title Ambulate with improve gait pattern without assistive device 05/15/15   Time 3   Period Weeks   Status New           PT Long Term Goals - 04/24/15 0915    PT LONG TERM GOAL #1   Title Improve trunk and LE ROM/mobility to [facility] 06/05/15   Time 6   Period Weeks   Status New   PT LONG TERM GOAL #2   Title 5/5 strength with min to no pain bilat LE's 06/05/15   Time 6   Period Weeks   Status New   PT LONG TERM GOAL #3   Title Pt to demonstrate good body mechanics for changing positions and for lifting techniques 06/04/15   Time 6   Period Weeks   Status New   PT LONG TERM GOAL #4   Title Patient I in HEP 06/04/15   Time 6   Period Weeks   Status New   PT LONG TERM GOAL #5   Title Improve FOTO to </= 53% limitation 06/04/15   Time 6   Period Weeks   Status New               Plan - 04/24/15 0908    Clinical Impression Statement Patient presents with acute LBP with Rt LE radiculal pain following lifting boxes 04/15/15. He has poor posture and alignment; extremely limited trunk and LE mobility/ROM; pain with any palpation; limited functional activity level with inability to rest, work or engage in normal functional activites. Pt witll benefit form PT to address problems identified.    Pt will benefit from skilled therapeutic intervention in order to improve on the following deficits Postural dysfunction;Improper body mechanics;Decreased range of motion;Decreased mobility;Abnormal gait;Pain;Decreased endurance;Decreased activity tolerance   Rehab Potential Good   PT Frequency 2x / week   PT Duration 6 weeks    PT Treatment/Interventions Patient/family education;ADLs/Self Care Home Management;Neuromuscular re-education;Therapeutic exercise;Therapeutic activities;Cryotherapy;Electrical Stimulation;Ultrasound;Manual techniques;Dry needling   PT Next Visit Plan further assessment of strength and ROM as tolerated; progress wtih mobility and core stability exercises; education re spine care    PT Home Exercise Plan spine care ed initiated; provided info for TENS unit; HEP   Consulted and Agree with Plan of Care Patient         Problem List Patient Active Problem List   Diagnosis Date Noted  . Acute right lumbar radiculopathy 04/16/2015  . Elevated blood pressure 04/16/2015    Harleigh Civello Rober Minion PT, MPH  04/24/2015, 9:21 AM  [facility] [address] [place], [name], [address] Phone: [phone]   Fax:  [phone]  Name: Nephi Savage MRN: [id number] Date of Birth: 01/19/1966

## 2015-04-24 NOTE — Patient Instructions (Signed)
Avoid being bent forward Avoid twisting Avoid sitting When sitting try to stay straight and not bent forward or leaning towards one side  Trunk Extension    Standing, place back of open hands on low back. Straighten spine then arch the back and move shoulders back. Hold 2-3 seconds Repeat _3-4___ times per session. Do __several__ times/day   TENS UNIT: This is helpful for muscle pain and spasm.   Search and Purchase a TENS 7000 2nd edition at www.tenspros.com. It should be less than $30.     TENS unit instructions: Do not shower or bathe with the unit on Turn the unit off before removing electrodes or batteries If the electrodes lose stickiness add a drop of water to the electrodes after they are disconnected from the unit and place on plastic sheet. If you continued to have difficulty, call the TENS unit company to purchase more electrodes. Do not apply lotion on the skin area prior to use. Make sure the skin is clean and dry as this will help prolong the life of the electrodes. After use, always check skin for unusual red areas, rash or other skin difficulties. If there are any skin problems, does not apply electrodes to the same area. Never remove the electrodes from the unit by pulling the wires. Do not use the TENS unit or electrodes other than as directed. Do not change electrode placement without consultating your therapist or physician. Keep 2 fingers with between each electrode.      Sleeping on Back  Place pillow under knees. A pillow with cervical support and a roll around waist are also helpful. Copyright  VHI. All rights reserved.  Sleeping on Side Place pillow between knees. Use cervical support under neck and a roll around waist as needed. Copyright  VHI. All rights reserved.   Sleeping on Stomach   If this is the only desirable sleeping position, place pillow under lower legs, and under stomach or chest as needed.  Posture - Sitting   Sit upright,  head facing forward. Try using a roll to support lower back. Keep shoulders relaxed, and avoid rounded back. Keep hips level with knees. Avoid crossing legs for long periods. Stand to Sit / Sit to Stand   To sit: Bend knees to lower self onto front edge of chair, then scoot back on seat. To stand: Reverse sequence by placing one foot forward, and scoot to front of seat. Use rocking motion to stand up.   Work Height and Reach  Ideal work height is no more than 2 to 4 inches below elbow level when standing, and at elbow level when sitting. Reaching should be limited to arm's length, with elbows slightly bent.  Bending  Bend at hips and knees, not back. Keep feet shoulder-width apart.    Posture - Standing   Good posture is important. Avoid slouching and forward head thrust. Maintain curve in low back and align ears over shoul- ders, hips over ankles.  Alternating Positions   Alternate tasks and change positions frequently to reduce fatigue and muscle tension. Take rest breaks. Computer Work   Position work to Art gallery manager. Use proper work and seat height. Keep shoulders back and down, wrists straight, and elbows at right angles. Use chair that provides full back support. Add footrest and lumbar roll as needed.  Getting Into / Out of Car  Lower self onto seat, scoot back, then bring in one leg at a time. Reverse sequence to get out.  Dressing  Lorenz Coaster  on back to pull socks or slacks over feet, or sit and bend leg while keeping back straight.    Housework - Sink  Place one foot on ledge of cabinet under sink when standing at sink for prolonged periods.   Pushing / Pulling  Pushing is preferable to pulling. Keep back in proper alignment, and use leg muscles to do the work.  Deep Squat   Squat and lift with both arms held against upper trunk. Tighten stomach muscles without holding breath. Use smooth movements to avoid jerking.  Avoid Twisting   Avoid twisting or bending  back. Pivot around using foot movements, and bend at knees if needed when reaching for articles.  Carrying Luggage   Distribute weight evenly on both sides. Use a cart whenever possible. Do not twist trunk. Move body as a unit.   Lifting Principles .Maintain proper posture and head alignment. .Slide object as close as possible before lifting. .Move obstacles out of the way. .Test before lifting; ask for help if too heavy. .Tighten stomach muscles without holding breath. .Use smooth movements; do not jerk. .Use legs to do the work, and pivot with feet. .Distribute the work load symmetrically and close to the center of trunk. .Push instead of pull whenever possible.   Ask For Help   Ask for help and delegate to others when possible. Coordinate your movements when lifting together, and maintain the low back curve.  Log Roll   Lying on back, bend left knee and place left arm across chest. Roll all in one movement to the right. Reverse to roll to the left. Always move as one unit. Housework - Sweeping  Use long-handled equipment to avoid stooping.   Housework - Wiping  Position yourself as close as possible to reach work surface. Avoid straining your back.  Laundry - Unloading Wash   To unload small items at bottom of washer, lift leg opposite to arm being used to reach.  Gardening - Raking  Move close to area to be raked. Use arm movements to do the work. Keep back straight and avoid twisting.     Cart  When reaching into cart with one arm, lift opposite leg to keep back straight.   Getting Into / Out of Bed  Lower self to lie down on one side by raising legs and lowering head at the same time. Use arms to assist moving without twisting. Bend both knees to roll onto back if desired. To sit up, start from lying on side, and use same move-ments in reverse. Housework - Vacuuming  Hold the vacuum with arm held at side. Step back and forth to move it, keeping head up.  Avoid twisting.   Laundry - Armed forces training and education officer so that bending and twisting can be avoided.   Laundry - Unloading Dryer  Squat down to reach into clothes dryer or use a reacher.  Gardening - Weeding / Psychiatric nurse or Kneel. Knee pads may be helpful.

## 2015-04-25 NOTE — Progress Notes (Signed)
Quick Note:  Xray pelvis does not show a lot of arthritis ______

## 2015-04-26 ENCOUNTER — Encounter: Payer: Self-pay | Admitting: Rehabilitative and Restorative Service Providers"

## 2015-04-26 ENCOUNTER — Telehealth: Payer: Self-pay | Admitting: Family Medicine

## 2015-04-26 NOTE — Telephone Encounter (Signed)
Hip MRI showed inflammation of the muscles around the hip joint this is probably the cause of the pain and is likely due to muscle strain.

## 2015-04-26 NOTE — Telephone Encounter (Signed)
Left detailed with results and recommendations.

## 2015-04-29 ENCOUNTER — Ambulatory Visit (INDEPENDENT_AMBULATORY_CARE_PROVIDER_SITE_OTHER): Payer: BLUE CROSS/BLUE SHIELD | Admitting: Family Medicine

## 2015-04-29 ENCOUNTER — Encounter: Payer: Self-pay | Admitting: Family Medicine

## 2015-04-29 ENCOUNTER — Other Ambulatory Visit: Payer: Self-pay

## 2015-04-29 VITALS — BP 139/89 | HR 73 | Wt 215.0 lb

## 2015-04-29 DIAGNOSIS — M25551 Pain in right hip: Secondary | ICD-10-CM | POA: Diagnosis not present

## 2015-04-29 DIAGNOSIS — M76821 Posterior tibial tendinitis, right leg: Secondary | ICD-10-CM

## 2015-04-29 DIAGNOSIS — M76829 Posterior tibial tendinitis, unspecified leg: Secondary | ICD-10-CM | POA: Insufficient documentation

## 2015-04-29 MED ORDER — PREDNISONE 10 MG (48) PO TBPK: ORAL_TABLET | Freq: Every day | ORAL | Status: DC

## 2015-04-29 NOTE — Assessment & Plan Note (Addendum)
Due to muscle strain. Plan for physical therapy Recheck in 2 weeks. Retry prednisone Dosepak

## 2015-04-29 NOTE — Patient Instructions (Signed)
Thank you for coming in today. Continue PT.  Take prednisone.  Return in 1-2 weeks.  Get the paperwork to me.

## 2015-04-29 NOTE — Assessment & Plan Note (Signed)
Foot and ankle pain consistent with posterior tibialis tendinitis. Treatment with physical therapy. Return in 2 weeks.

## 2015-04-29 NOTE — Progress Notes (Signed)
       William Vincent is a 50 y.o. male who presents to [name]: Primary Care today for follow-up hip pain. Patient has been seen several times the last week or 2 for back and hip pain. He's had a lumbar cervical and now hip MRI. The only MRI that has had any significant abnormalities was the recent right hip MRI which did show some inflammation of the sartorius muscle consistent with muscle strain. He continues to have right hip pain worse with activity better with rest. He additionally notes now medial ankle pain into the foot. This is been worse recently. His pain continues to be severe.   Past Medical History  Diagnosis Date  . Gout   . Kidney stones   . Hypertension    Past Surgical History  Procedure Laterality Date  . Tonsillectomy and adenoidectomy     Social History  Substance Use Topics  . Smoking status: Never Smoker   . Smokeless tobacco: Not on file  . Alcohol Use: No   family history is not on file.  ROS as above Medications: Current Outpatient Prescriptions  Medication Sig Dispense Refill  . baclofen (LIORESAL) 10 MG tablet Take 1 tablet (10 mg total) by mouth 3 (three) times daily. 30 each 0  . HYDROcodone-acetaminophen (NORCO) 10-325 MG tablet Take 1 tablet by mouth every 8 (eight) hours as needed. 40 tablet 0  . LORazepam (ATIVAN) 0.5 MG tablet 1-2 tabs 30-60 mins prior to mri 2 tablet 0  . predniSONE (STERAPRED UNI-PAK 48 TAB) 10 MG (48) TBPK tablet Take by mouth daily. 12 day dosepack po 48 tablet 0   No current facility-administered medications for this visit.   No Known Allergies   Exam:  BP 139/89 mmHg  Pulse 73  Wt 215 lb (97.523 kg) Gen: Well NAD HEENT: EOMI,  MMM Lungs: Normal work of breathing. CTABL Heart: RRR no MRG Abd: NABS, Soft. Nondistended, Nontender Exts: Brisk capillary refill, warm and well perfused.  Pulses intact distally into the right  foot. Right hip normal-appearing. No palpitation anterior thigh. Pain with resisted hip flexion. Hip motion mildly painful with rotation. Back tender palpation right low back including SI joint. Pain with extension and flexion. Antalgic gait. Ankle: Tender palpation overlying the course of the posterior tibialis tendon. No significant swelling. Pain with resisted foot inversion.  MRI C-spine and L-spine and right hip reviewed  No results found for this or any previous visit (from the past 24 hour(s)). No results found.   Please see individual assessment and plan sections.

## 2015-05-01 ENCOUNTER — Ambulatory Visit (INDEPENDENT_AMBULATORY_CARE_PROVIDER_SITE_OTHER): Payer: BLUE CROSS/BLUE SHIELD | Admitting: Physical Therapy

## 2015-05-01 DIAGNOSIS — M2560 Stiffness of unspecified joint, not elsewhere classified: Secondary | ICD-10-CM

## 2015-05-01 DIAGNOSIS — M5416 Radiculopathy, lumbar region: Secondary | ICD-10-CM

## 2015-05-01 DIAGNOSIS — R6889 Other general symptoms and signs: Secondary | ICD-10-CM

## 2015-05-01 DIAGNOSIS — M79604 Pain in right leg: Secondary | ICD-10-CM

## 2015-05-01 DIAGNOSIS — M623 Immobility syndrome (paraplegic): Secondary | ICD-10-CM | POA: Diagnosis not present

## 2015-05-01 DIAGNOSIS — R293 Abnormal posture: Secondary | ICD-10-CM | POA: Diagnosis not present

## 2015-05-01 DIAGNOSIS — Z7409 Other reduced mobility: Secondary | ICD-10-CM

## 2015-05-01 DIAGNOSIS — R531 Weakness: Secondary | ICD-10-CM

## 2015-05-01 NOTE — Therapy (Signed)
[facility] [place] [place], [place], [address] Phone: [phone]   Fax:  [phone]  Physical Therapy Treatment  Patient Details  Name: William Vincent MRN: [id number] Date of Birth: 1965/06/01 Referring Provider: Dr. Georgina Snell  Encounter Date: 05/01/2015      PT End of Session - 05/01/15 0901    Visit Number 2   Number of Visits 12   Date for PT Re-Evaluation 06/05/15   PT Start Time 9417  pt arrived late   PT Stop Time 0947   PT Time Calculation (min) 56 min   Activity Tolerance Patient limited by pain      Past Medical History  Diagnosis Date  . Gout   . Kidney stones   . Hypertension     Past Surgical History  Procedure Laterality Date  . Tonsillectomy and adenoidectomy      There were no vitals filed for this visit.  Visit Diagnosis:  Lumbar radiculopathy, acute  Leg pain, right  Stiffness due to immobility  Abnormal posture  Decreased strength, endurance, and mobility      Subjective Assessment - 05/01/15 0901    Subjective Pt reports he has felt worse since he was here a week. Using heating pad and medicine for pain relief.  Has had trouble sleeping as well.     Currently in Pain? Yes   Pain Score 7    Pain Location Back   Pain Orientation Right   Pain Descriptors / Indicators Sharp   Pain Radiating Towards posterior hip/ buttocks, calf, knee    Aggravating Factors  standing, walking, bending    Pain Relieving Factors heating pad, laying on side, medicine.             [facility] PT Assessment - 05/01/15 0001    Assessment   Medical Diagnosis Rt lumbar radiculopathy    Referring Provider Dr. Georgina Snell   Onset Date/Surgical Date 04/15/15   Hand Dominance Right   Next MD Visit 05/13/15           [facility] Adult PT Treatment/Exercise - 05/01/15 0001    Self-Care   Self-Care Other Self-Care Comments   Other Self-Care Comments  SPC raised to correct height; pt instructed in proper  adjustment.  Pt taught to engage core with transition movements.    Lumbar Exercises: Stretches   Passive Hamstring Stretch 30 seconds;4 reps   Passive Hamstring Stretch Limitations leg assisted by PTA, too painful with strap in supine.    Prone on Elbows Stretch 10 seconds;3 reps   Press Ups 4 reps  2-3 sec; very limited range   Quad Stretch 30 seconds;3 reps  RLE.  PTA assist due to pain with use of strap   Lumbar Exercises: Aerobic   Stationary Bike NuStep L1: 5 min with LE, unable to tolerate arms.  (SPM 0-18)    Lumbar Exercises: Supine   Ab Set 5 reps;5 seconds   Modalities   Modalities Electrical Stimulation;Moist Heat                PT Education - 05/01/15 1146    Education provided Yes   Education Details HEP- added POE and quad/hamstring stretches  with assistance of girlfriend.    Person(s) Educated Other (comment);Patient   Methods Explanation;Demonstration;Tactile cues;Verbal cues   Comprehension Verbalized understanding;Returned demonstration          PT Short Term Goals - 04/24/15 0913    PT SHORT TERM GOAL #1   Title Decrease pain by  50-70% allowing pt to particiate in exercise and ADL's 05/15/15   Time 3   Period Weeks   Status New   PT SHORT TERM GOAL #2   Title Improve posture and alignment with pt to demonstrate good upright posture and alignment 05/15/15   Time 3   Period Weeks   PT SHORT TERM GOAL #3   Title Ambulate with improve gait pattern without assistive device 05/15/15   Time 3   Period Weeks   Status New           PT Long Term Goals - 04/24/15 0915    PT LONG TERM GOAL #1   Title Improve trunk and LE ROM/mobility to [facility] 06/05/15   Time 6   Period Weeks   Status New   PT LONG TERM GOAL #2   Title 5/5 strength with min to no pain bilat LE's 06/05/15   Time 6   Period Weeks   Status New   PT LONG TERM GOAL #3   Title Pt to demonstrate good body mechanics for changing positions and for lifting techniques 06/04/15   Time 6    Period Weeks   Status New   PT LONG TERM GOAL #4   Title Patient I in HEP 06/04/15   Time 6   Period Weeks   Status New   PT LONG TERM GOAL #5   Title Improve FOTO to </= 53% limitation 06/04/15   Time 6   Period Weeks   Status New               Plan - 05/01/15 1138    Clinical Impression Statement Pt with limited exercise tolerance due to pain; continued limitation of trunk and LE. Pt required increased time for position changes between exercises. Pt's girlfriend was instructed how to help pt stretch hamstrings and quads and returned demonstration.  Pt verbalized understanding of working on improved posture to decrease pain.  Pt reported decreased pain in LB with use of estim/heat in hooklying position.  No goals met yet, only second visit.    Pt will benefit from skilled therapeutic intervention in order to improve on the following deficits Postural dysfunction;Improper body mechanics;Decreased range of motion;Decreased mobility;Abnormal gait;Pain;Decreased endurance;Decreased activity tolerance   Rehab Potential Good   PT Frequency 2x / week   PT Duration 6 weeks   PT Treatment/Interventions Patient/family education;ADLs/Self Care Home Management;Neuromuscular re-education;Therapeutic exercise;Therapeutic activities;Cryotherapy;Electrical Stimulation;Ultrasound;Manual techniques;Dry needling   PT Next Visit Plan further assessment of strength and ROM as tolerated; progress wtih mobility and core stability exercises; education re spine care    Consulted and Agree with Plan of Care Patient        Problem List Patient Active Problem List   Diagnosis Date Noted  . Tibialis posterior tendinitis 04/29/2015  . Right hip pain 04/24/2015  . Acute right lumbar radiculopathy 04/16/2015  . Elevated blood pressure 04/16/2015   Kerin Perna, PTA 05/01/2015 11:58 AM  [facility] [place], [place],  [address] Phone: [phone]   Fax:  [phone]  Name: William Vincent MRN: [id number] Date of Birth: 1965/11/22

## 2015-05-03 ENCOUNTER — Ambulatory Visit (INDEPENDENT_AMBULATORY_CARE_PROVIDER_SITE_OTHER): Payer: BLUE CROSS/BLUE SHIELD | Admitting: Rehabilitative and Restorative Service Providers"

## 2015-05-03 ENCOUNTER — Encounter: Payer: Self-pay | Admitting: Rehabilitative and Restorative Service Providers"

## 2015-05-03 DIAGNOSIS — M2560 Stiffness of unspecified joint, not elsewhere classified: Secondary | ICD-10-CM

## 2015-05-03 DIAGNOSIS — M623 Immobility syndrome (paraplegic): Secondary | ICD-10-CM | POA: Diagnosis not present

## 2015-05-03 DIAGNOSIS — Z7409 Other reduced mobility: Secondary | ICD-10-CM

## 2015-05-03 DIAGNOSIS — M5416 Radiculopathy, lumbar region: Secondary | ICD-10-CM

## 2015-05-03 DIAGNOSIS — R531 Weakness: Secondary | ICD-10-CM

## 2015-05-03 DIAGNOSIS — R293 Abnormal posture: Secondary | ICD-10-CM

## 2015-05-03 DIAGNOSIS — M79604 Pain in right leg: Secondary | ICD-10-CM | POA: Diagnosis not present

## 2015-05-03 NOTE — Therapy (Signed)
[facility] [place] [address] [place], [name], [address] Phone: [phone]   Fax:  [phone]  Physical Therapy Treatment  Patient Details  Name: William Vincent MRN: [id number] Date of Birth: 1965-04-27 Referring Provider: Dr. [name]  Encounter Date: 05/03/2015      PT End of Session - 05/03/15 0854    Visit Number 3   Number of Visits 12   Date for PT Re-Evaluation 06/05/15   PT Start Time 0852   PT Stop Time 0946   PT Time Calculation (min) 54 min   Activity Tolerance Patient limited by pain;Patient tolerated treatment well      Past Medical History  Diagnosis Date  . Gout   . Kidney stones   . Hypertension     Past Surgical History  Procedure Laterality Date  . Tonsillectomy and adenoidectomy      There were no vitals filed for this visit.  Visit Diagnosis:  Lumbar radiculopathy, acute  Leg pain, right  Stiffness due to immobility  Abnormal posture  Decreased strength, endurance, and mobility      Subjective Assessment - 05/03/15 0855    Subjective Continued pain in back and legs especially the right leg. MRI showed no disc problems "just pulled muscles"    Currently in Pain? Yes   Pain Score 7    Pain Location Back   Pain Orientation Right   Pain Descriptors / Indicators Sore;Aching;Tightness   Pain Type Acute pain   Pain Radiating Towards into the Rt hip and leg                          [facility] Adult PT Treatment/Exercise - 05/03/15 0001    Neuro Re-ed    Neuro Re-ed Details  working on upright posture and alignment; working on lying prone - flat    Lumbar Exercises: Stretches   Passive Hamstring Stretch 3 reps;30 seconds  opposite knee bent to decrease LBP    Passive Hamstring Stretch Limitations VC/TC for form especially on Rt    Prone on Elbows Stretch 30 seconds;60 seconds;1 rep   Press Ups --  10 reps 2 sec hold very limited range   Quad Stretch 30 seconds;2 reps   with strap - assist into position for stretch    Lumbar Exercises: Aerobic   Stationary Bike Nustep L3 x 5 LE's only    Lumbar Exercises: Supine   Ab Set --  3 part core 10 sec x 10    AB Set Limitations difficulty with all parts of core stabilization    Moist Heat Therapy   Number Minutes Moist Heat 20 Minutes   Moist Heat Location Lumbar Spine   Electrical Stimulation   Electrical Stimulation Location bilat L5; bilat hips    Electrical Stimulation Action IFC   Electrical Stimulation Parameters to tolerance   Electrical Stimulation Goals Pain;Tone   Manual Therapy   Manual therapy comments pt prone ankles supported on bolster   Joint Mobilization spinal mobs Grade 1/11 lumbar spine poorly tolerated    Soft tissue mobilization bilat lumbar paraspinals QL; lats; hip abductors Rt > Lt    Myofascial Release lumbar spine                 PT Education - 05/03/15 0935    Education provided Yes   Education Details HEP working on form for 3 part core    Person(s) Educated Patient   Methods Explanation;Tactile cues;Verbal cues  Comprehension Verbalized understanding;Returned demonstration;Verbal cues required;Tactile cues required;Need further instruction          PT Short Term Goals - 05/03/15 0942    PT SHORT TERM GOAL #1   Title Decrease pain by 50-70% allowing pt to particiate in exercise and ADL's 05/15/15   Time 3   Period Weeks   Status On-going   PT SHORT TERM GOAL #2   Title Improve posture and alignment with pt to demonstrate good upright posture and alignment 05/15/15   Time 3   Period Weeks   Status On-going   PT SHORT TERM GOAL #3   Title Ambulate with improve gait pattern without assistive device 05/15/15   Time 3   Period Weeks   Status On-going           PT Long Term Goals - 05/03/15 0943    PT LONG TERM GOAL #1   Title Improve trunk and LE ROM/mobility to [facility] 06/05/15   Time 6   Period Weeks   Status On-going   PT LONG TERM GOAL #2   Title  5/5 strength with min to no pain bilat LE's 06/05/15   Time 6   Period Weeks   Status On-going   PT LONG TERM GOAL #3   Title Pt to demonstrate good body mechanics for changing positions and for lifting techniques 06/04/15   Time 6   Period Weeks   Status On-going   PT LONG TERM GOAL #4   Title Patient I in HEP 06/04/15   Time 6   Period Weeks   Status On-going   PT LONG TERM GOAL #5   Title Improve FOTO to </= 53% limitation 06/04/15   Time 6   Period Weeks   Status On-going               Plan - 05/03/15 1610    Clinical Impression Statement Continued pain with difficulty with movement and gait and transitional movements. Sensitive to pressure with manual work through lumbar spine. Tolerated treatment well with demo of improved ability to perform exercise with less assistance.    Pt will benefit from skilled therapeutic intervention in order to improve on the following deficits Postural dysfunction;Improper body mechanics;Decreased range of motion;Decreased mobility;Abnormal gait;Pain;Decreased endurance;Decreased activity tolerance   Rehab Potential Good   PT Frequency 2x / week   PT Duration 6 weeks   PT Treatment/Interventions Patient/family education;ADLs/Self Care Home Management;Neuromuscular re-education;Therapeutic exercise;Therapeutic activities;Cryotherapy;Electrical Stimulation;Ultrasound;Manual techniques;Dry needling   PT Next Visit Plan further assessment of strength and ROM as tolerated; progress wtih mobility and core stability exercises; education re spine care    PT Home Exercise Plan continued education; HEP   Consulted and Agree with Plan of Care Patient        Problem List Patient Active Problem List   Diagnosis Date Noted  . Tibialis posterior tendinitis 04/29/2015  . Right hip pain 04/24/2015  . Acute right lumbar radiculopathy 04/16/2015  . Elevated blood pressure 04/16/2015    Mosie Angus Rober Minion PT, MPH  05/03/2015, 9:44 AM  [facility] [address] [place], [name], [address] Phone: [phone]   Fax:  [phone]  Name: William Vincent MRN: [id number] Date of Birth: Feb 03, 1966

## 2015-05-08 ENCOUNTER — Ambulatory Visit (INDEPENDENT_AMBULATORY_CARE_PROVIDER_SITE_OTHER): Payer: BLUE CROSS/BLUE SHIELD | Admitting: Physical Therapy

## 2015-05-08 ENCOUNTER — Telehealth: Payer: Self-pay | Admitting: Family Medicine

## 2015-05-08 DIAGNOSIS — R6889 Other general symptoms and signs: Secondary | ICD-10-CM

## 2015-05-08 DIAGNOSIS — R293 Abnormal posture: Secondary | ICD-10-CM | POA: Diagnosis not present

## 2015-05-08 DIAGNOSIS — M623 Immobility syndrome (paraplegic): Secondary | ICD-10-CM | POA: Diagnosis not present

## 2015-05-08 DIAGNOSIS — R531 Weakness: Secondary | ICD-10-CM

## 2015-05-08 DIAGNOSIS — M79604 Pain in right leg: Secondary | ICD-10-CM

## 2015-05-08 DIAGNOSIS — Z7409 Other reduced mobility: Secondary | ICD-10-CM

## 2015-05-08 DIAGNOSIS — M5416 Radiculopathy, lumbar region: Secondary | ICD-10-CM | POA: Diagnosis not present

## 2015-05-08 DIAGNOSIS — M2560 Stiffness of unspecified joint, not elsewhere classified: Secondary | ICD-10-CM

## 2015-05-08 NOTE — Telephone Encounter (Signed)
Patient went to PT and the therapist suggested that a rolling walker would be more beneficial for him than the cane he has been using.  Can you write a script and they will be by Friday morning to get it?  Thanks!!

## 2015-05-08 NOTE — Therapy (Signed)
[place], [place], [address] Phone: [phone]   Fax:  [phone]  Physical Therapy Treatment  Patient Details  Name: William Vincent MRN: [id number] Date of Birth: 1965/04/22 Referring Provider: Dr. Georgina Snell  Encounter Date: 05/08/2015      PT End of Session - 05/08/15 1658    Visit Number 4   Number of Visits 12   Date for PT Re-Evaluation 06/05/15   PT Start Time 1432   PT Stop Time 1525   PT Time Calculation (min) 53 min   Activity Tolerance Patient limited by pain      Past Medical History  Diagnosis Date  . Gout   . Kidney stones   . Hypertension     Past Surgical History  Procedure Laterality Date  . Tonsillectomy and adenoidectomy      There were no vitals filed for this visit.  Visit Diagnosis:  Lumbar radiculopathy, acute  Leg pain, right  Stiffness due to immobility  Abnormal posture  Decreased strength, endurance, and mobility      Subjective Assessment - 05/08/15 1654    Subjective Pt reports he feels no relief since last visit even though he has been doing the stretches and exercises.  Pt's girlfriend remarks his legs have been tremulous, especially after stretches she helps with.     Patient Stated Goals Get back into shape - get rid of pain; get back and legs right   Currently in Pain? Yes   Pain Score 8    Pain Location Back   Pain Orientation Upper;Mid;Lower   Pain Descriptors / Indicators Aching;Sharp;Shooting   Pain Radiating Towards into Rt hip and leg   Aggravating Factors  standing, walking, bending   Pain Relieving Factors heating pad, laying on side, medicine            [facility] PT Assessment - 05/08/15 0001    Assessment   Medical Diagnosis Rt lumbar radiculopathy    Referring Provider Dr. Georgina Snell   Onset Date/Surgical Date 04/15/15   Hand Dominance Right   Next MD Visit 05/13/15           [facility] Adult PT Treatment/Exercise - 05/08/15 0001    Ambulation/Gait   Ambulation/Gait Yes   Ambulation/Gait Assistance --  CGA   Ambulation Distance (Feet) 160 Feet   Assistive device Rolling walker   Gait Pattern Trunk flexed;Shuffle;Poor foot clearance - left;Poor foot clearance - right   Gait Comments VC for upright posture and to walk closer to RW.    Self-Care   Self-Care Other Self-Care Comments   Other Self-Care Comments  Pt and his girllfriend educated on use of RW with stairs - girlfriend returned demo of proper support with walking up stairs backwards with RW.  Educated on safty with stairs and how girlfriend can best provide assistance and guard from pt falling, and use of RW. Pt and pt's girlfriend verbalized understanding.    Lumbar Exercises: Stretches   Sports administrator 60 seconds;1 rep   Moist Heat Therapy   Number Minutes Moist Heat 15 Minutes   Moist Heat Location Lumbar Spine   Electrical Stimulation   Electrical Stimulation Location bilat L5; bilat hips    Electrical Stimulation Action IFC   Electrical Stimulation Parameters to tolerance    Electrical Stimulation Goals Pain;Tone   Manual Therapy   Manual Therapy Taping;Myofascial release;Soft tissue mobilization   Manual therapy comments pt prone ankles supported on bolster   Soft tissue mobilization to thoracic /  lumbar paraspinals (cross fiber and TPR)    Myofascial Release thoracic / lumbar paraspinals (decreased tolerance despite lessening pressure.    Kinesiotex Inhibit Muscle   Kinesiotix   Inhibit Muscle  to lumbar paraspinals to decrease sensitivity and pain.                   PT Short Term Goals - 05/03/15 0942    PT SHORT TERM GOAL #1   Title Decrease pain by 50-70% allowing pt to particiate in exercise and ADL's 05/15/15   Time 3   Period Weeks   Status On-going   PT SHORT TERM GOAL #2   Title Improve posture and alignment with pt to demonstrate good upright posture and alignment 05/15/15   Time 3   Period Weeks   Status On-going   PT SHORT  TERM GOAL #3   Title Ambulate with improve gait pattern without assistive device 05/15/15   Time 3   Period Weeks   Status On-going           PT Long Term Goals - 05/03/15 0943    PT LONG TERM GOAL #1   Title Improve trunk and LE ROM/mobility to [facility] 06/05/15   Time 6   Period Weeks   Status On-going   PT LONG TERM GOAL #2   Title 5/5 strength with min to no pain bilat LE's 06/05/15   Time 6   Period Weeks   Status On-going   PT LONG TERM GOAL #3   Title Pt to demonstrate good body mechanics for changing positions and for lifting techniques 06/04/15   Time 6   Period Weeks   Status On-going   PT LONG TERM GOAL #4   Title Patient I in HEP 06/04/15   Time 6   Period Weeks   Status On-going   PT LONG TERM GOAL #5   Title Improve FOTO to </= 53% limitation 06/04/15   Time 6   Period Weeks   Status On-going               Plan - 05/08/15 1659    Clinical Impression Statement Pt very point tender along entire length of spine with attempts at gentle manual therapy with pt in prone position.  Trial of rock tape applied to LB. No goals met yet. Pt demonstrated improved and safer gait pattern with use of RW.     Pt will benefit from skilled therapeutic intervention in order to improve on the following deficits Postural dysfunction;Improper body mechanics;Decreased range of motion;Decreased mobility;Abnormal gait;Pain;Decreased endurance;Decreased activity tolerance   Rehab Potential Good   PT Frequency 2x / week   PT Duration 6 weeks   PT Treatment/Interventions Patient/family education;ADLs/Self Care Home Management;Neuromuscular re-education;Therapeutic exercise;Therapeutic activities;Cryotherapy;Electrical Stimulation;Ultrasound;Manual techniques;Dry needling   PT Next Visit Plan Progress mobility and core stability strengthening as tolerated.  Assess response to rock tape.    Consulted and Agree with Plan of Care Patient        Problem List Patient Active Problem List    Diagnosis Date Noted  . Tibialis posterior tendinitis 04/29/2015  . Right hip pain 04/24/2015  . Acute right lumbar radiculopathy 04/16/2015  . Elevated blood pressure 04/16/2015    Kerin Perna, PTA 05/08/2015 5:11 PM  [place], [place], [address] Phone: [phone]   Fax:  [phone]  Name: Hazael Olveda MRN: [id number] Date of Birth: [birth year]

## 2015-05-09 NOTE — Telephone Encounter (Signed)
Done

## 2015-05-10 ENCOUNTER — Ambulatory Visit (INDEPENDENT_AMBULATORY_CARE_PROVIDER_SITE_OTHER): Payer: BLUE CROSS/BLUE SHIELD | Admitting: Physical Therapy

## 2015-05-10 DIAGNOSIS — M623 Immobility syndrome (paraplegic): Secondary | ICD-10-CM | POA: Diagnosis not present

## 2015-05-10 DIAGNOSIS — M79604 Pain in right leg: Secondary | ICD-10-CM

## 2015-05-10 DIAGNOSIS — M5416 Radiculopathy, lumbar region: Secondary | ICD-10-CM | POA: Diagnosis not present

## 2015-05-10 DIAGNOSIS — M2560 Stiffness of unspecified joint, not elsewhere classified: Secondary | ICD-10-CM

## 2015-05-10 DIAGNOSIS — Z7409 Other reduced mobility: Secondary | ICD-10-CM

## 2015-05-10 NOTE — Therapy (Signed)
[facility] [place] [address] [place], [place], [address] Phone: [phone]   Fax:  [phone]  Physical Therapy Treatment  Patient Details  Name: William MouldDannie Vincent MRN: [id number] Date of Birth: [birth year] Referring Provider: Dr. Denyse Amassorey  Encounter Date: 05/10/2015      PT End of Session - 05/10/15 1206    Visit Number 5   Number of Visits 12   Date for PT Re-Evaluation 06/05/15   PT Start Time 1104   PT Stop Time 1204   PT Time Calculation (min) 60 min   Activity Tolerance Patient limited by pain      Past Medical History  Diagnosis Date  . Gout   . Kidney stones   . Hypertension     Past Surgical History  Procedure Laterality Date  . Tonsillectomy and adenoidectomy      There were no vitals filed for this visit.  Visit Diagnosis:  Lumbar radiculopathy, acute  Stiffness due to immobility  Leg pain, right          [facility] PT Assessment - 05/10/15 0001    Assessment   Medical Diagnosis Rt lumbar radiculopathy    Referring Provider Dr. Denyse Amassorey   Onset Date/Surgical Date 04/15/15   Hand Dominance Right   Next MD Visit 05/13/15    AROM   Lumbar Flexion 40%   Lumbar Extension 0%   Lumbar - Right Side Bend 45%   Lumbar - Left Side Bend 40%   Lumbar - Right Rotation 5%   Lumbar - Left Rotation 5%                     [facility] Adult PT Treatment/Exercise - 05/10/15 0001    Ambulation/Gait   Ambulation/Gait Assistance --  CGA   Ambulation Distance (Feet) 100 Feet   Assistive device Rolling walker   Gait Pattern Decreased step length - right;Decreased step length - left  forward flexed head, elevated shoulders    Gait velocity decreased speed    Gait Comments VC for relaxed shoulders, upright posture and stepping within RW (not behind) much improved after cues.    Self-Care   Other Self-Care Comments  Educated pt and pt's girlfriend regarding safe use of RW with transfers; pt returned demo 2 x.    Lumbar Exercises: Aerobic   Stationary Bike NuStep L3: legs and arms x 5 min, (0-18 SPM)   Lumbar Exercises: Standing   Other Standing Lumbar Exercises Gastroc stretch standing x 20 sec x 2 reps each leg- tactile cues for improved form.    Other Standing Lumbar Exercises standing with upright posture in RW (challenging) x 4 reps    Lumbar Exercises: Supine   Other Supine Lumbar Exercises prolonged snow angel position - up to 110 deg, then 10 snow angels   Other Supine Lumbar Exercises chin tucks x 3 sec hold x 10 reps- with VC and tactile cues; shoulder press x 10 reps with multiple cues to contract muscle to tolerance, tactile cues.   SAQ with each leg x 8 reps (to decrease stiffness)                   PT Short Term Goals - 05/03/15 0942    PT SHORT TERM GOAL #1   Title Decrease pain by 50-70% allowing pt to particiate in exercise and ADL's 05/15/15   Time 3   Period Weeks   Status On-going   PT SHORT TERM GOAL #2   Title Improve  posture and alignment with pt to demonstrate good upright posture and alignment 05/15/15   Time 3   Period Weeks   Status On-going   PT SHORT TERM GOAL #3   Title Ambulate with improve gait pattern without assistive device 05/15/15   Time 3   Period Weeks   Status On-going           PT Long Term Goals - 05/03/15 0943    PT LONG TERM GOAL #1   Title Improve trunk and LE ROM/mobility to [facility] 06/05/15   Time 6   Period Weeks   Status On-going   PT LONG TERM GOAL #2   Title 5/5 strength with min to no pain bilat LE's 06/05/15   Time 6   Period Weeks   Status On-going   PT LONG TERM GOAL #3   Title Pt to demonstrate good body mechanics for changing positions and for lifting techniques 06/04/15   Time 6   Period Weeks   Status On-going   PT LONG TERM GOAL #4   Title Patient I in HEP 06/04/15   Time 6   Period Weeks   Status On-going   PT LONG TERM GOAL #5   Title Improve FOTO to </= 53% limitation 06/04/15   Time 6   Period Weeks   Status  On-going               Plan - 05/10/15 1307    Clinical Impression Statement Pt demonstrated improved posture with use of RW and VC.  Pt reported decrease in pain level in decompression position (hooklying) and further decrease with use and estim in same position.  Pt's lumbar mobility unchanged from eval.  Pt tolerated new exercises with minimal /no increase in pain. Pt didn't like the way Rock tape felt and removed it the same day as application.  Making very gradual progress.     Pt will benefit from skilled therapeutic intervention in order to improve on the following deficits Postural dysfunction;Improper body mechanics;Decreased range of motion;Decreased mobility;Abnormal gait;Pain;Decreased endurance;Decreased activity tolerance   Rehab Potential Good   PT Frequency 2x / week   PT Duration 6 weeks   PT Treatment/Interventions Patient/family education;ADLs/Self Care Home Management;Neuromuscular re-education;Therapeutic exercise;Therapeutic activities;Cryotherapy;Electrical Stimulation;Ultrasound;Manual techniques;Dry needling   PT Next Visit Plan Progress mobility and core stability strengthening as tolerated.  Await further instruction from MD.    Consulted and Agree with Plan of Care Patient        Problem List Patient Active Problem List   Diagnosis Date Noted  . Tibialis posterior tendinitis 04/29/2015  . Right hip pain 04/24/2015  . Acute right lumbar radiculopathy 04/16/2015  . Elevated blood pressure 04/16/2015   Mayer Camel, PTA 05/10/2015 1:20 PM  [facility] [address] [place], [name], [address] Phone: [phone]   Fax:  [phone]  Name: William Vincent MRN: [id number] Date of Birth: [birth year]

## 2015-05-13 ENCOUNTER — Other Ambulatory Visit: Payer: Self-pay | Admitting: Family Medicine

## 2015-05-13 ENCOUNTER — Ambulatory Visit (INDEPENDENT_AMBULATORY_CARE_PROVIDER_SITE_OTHER): Payer: BLUE CROSS/BLUE SHIELD | Admitting: Family Medicine

## 2015-05-13 VITALS — BP 154/97 | HR 74 | Wt 222.0 lb

## 2015-05-13 DIAGNOSIS — M25551 Pain in right hip: Secondary | ICD-10-CM

## 2015-05-13 DIAGNOSIS — Z113 Encounter for screening for infections with a predominantly sexual mode of transmission: Secondary | ICD-10-CM

## 2015-05-13 DIAGNOSIS — M5416 Radiculopathy, lumbar region: Secondary | ICD-10-CM | POA: Diagnosis not present

## 2015-05-13 MED ORDER — LORAZEPAM 0.5 MG PO TABS: ORAL_TABLET | ORAL | Status: DC

## 2015-05-13 MED ORDER — PREDNISONE 10 MG (48) PO TBPK: ORAL_TABLET | Freq: Every day | ORAL | Status: DC

## 2015-05-13 MED ORDER — GABAPENTIN 300 MG PO CAPS: ORAL_CAPSULE | ORAL | Status: DC

## 2015-05-13 MED ORDER — HYDROCODONE-ACETAMINOPHEN 10-325 MG PO TABS: 1.0000 | ORAL_TABLET | Freq: Three times a day (TID) | ORAL | Status: DC | PRN

## 2015-05-13 NOTE — Progress Notes (Signed)
       William Vincent is a 50 y.o. male who [name] today for follow-up back pain. Patient has had continuous low back pain radiating to both legs now for about a month. He's had an extensive workup including MRIs to his lumbar spine and cervical spine and right hip without any obvious cause. He's had a trial of steroid Dosepak which did not help much. He's had physical therapy which also helps only a little. He continues to be quite debilitated by the severe pain.   Past Medical History  Diagnosis Date  . Gout   . Kidney stones   . Hypertension    Past Surgical History  Procedure Laterality Date  . Tonsillectomy and adenoidectomy     Social History  Substance Use Topics  . Smoking status: Never Smoker   . Smokeless tobacco: Not on file  . Alcohol Use: No   family history is not on file.  ROS as above Medications: Current Outpatient Prescriptions  Medication Sig Dispense Refill  . HYDROcodone-acetaminophen (NORCO) 10-325 MG tablet Take 1 tablet by mouth every 8 (eight) hours as needed. 40 tablet 0  . gabapentin (NEURONTIN) 300 MG capsule One tab PO qHS for a week, then BID for a week, then TID. May double weekly to a max of 3,600mg /day 180 capsule 3  . LORazepam (ATIVAN) 0.5 MG tablet 1-2 tabs 30-60 mins prior to mri 2 tablet 0  . predniSONE (STERAPRED UNI-PAK 48 TAB) 10 MG (48) TBPK tablet Take by mouth daily. 12 day dosepack po 48 tablet 0   No current facility-administered medications for this visit.   No Known Allergies   Exam:  BP 154/97 mmHg  Pulse 74  Wt 222 lb (100.699 kg) Gen: Well NAD Back is nontender to spinal midline. Tender palpation bilateral lumbar paraspinals. Positive slump test bilaterally. Hips are diffusely tender to the anterior aspect the hips bilaterally with pain with rotation bilaterally. Patient has pain with hip flexion. Additionally  he has an antalgic gait. Sensation is intact bilateral lower extremities.  No results found for this or any previous visit (from the past 24 hour(s)). No results found.   50 year old male with continued hip back pain and some radicular component. The etiology at this time is very unclear to me. I'm not sure what is causing his continued pain. He does not have a discrete anatomical explanation for his pain with visualization of his lumbar spine cervical spine and right hip. At this point I think is reasonable to pursue a rheumatologic workup. Additionally we'll obtain MRI of the thoracic spine. I think is reasonable to repeat a prednisone course as well as trial of gabapentin for his radicular pain. If not better in 2 weeks at that point I think it be reasonable to proceed with a nerve conduction study. Recheck in 1-2 weeks.

## 2015-05-13 NOTE — Patient Instructions (Signed)
Thank you for coming in today. Get labs.  Get MRI.  Take gabapentin three times daily.  Start taking just at night and then advance to three times daily.  Return in 1-2 weeks.   Take prednsione again.

## 2015-05-14 LAB — COMPREHENSIVE METABOLIC PANEL
ALT: 18 U/L (ref 9–46)
AST: 11 U/L (ref 10–40)
Albumin: 4 g/dL (ref 3.6–5.1)
Alkaline Phosphatase: 62 U/L (ref 40–115)
BUN: 20 mg/dL (ref 7–25)
CHLORIDE: 100 mmol/L (ref 98–110)
CO2: 27 mmol/L (ref 20–31)
CREATININE: 0.94 mg/dL (ref 0.60–1.35)
Calcium: 8.9 mg/dL (ref 8.6–10.3)
Glucose, Bld: 96 mg/dL (ref 65–99)
POTASSIUM: 4.4 mmol/L (ref 3.5–5.3)
SODIUM: 139 mmol/L (ref 135–146)
TOTAL PROTEIN: 6.8 g/dL (ref 6.1–8.1)
Total Bilirubin: 0.5 mg/dL (ref 0.2–1.2)

## 2015-05-14 LAB — HIV ANTIBODY (ROUTINE TESTING W REFLEX): HIV: NONREACTIVE

## 2015-05-14 LAB — ANA: Anti Nuclear Antibody(ANA): NEGATIVE

## 2015-05-14 LAB — SEDIMENTATION RATE: SED RATE: 1 mm/h (ref 0–15)

## 2015-05-14 LAB — SJOGRENS SYNDROME-B EXTRACTABLE NUCLEAR ANTIBODY: SSB (La) (ENA) Antibody, IgG: <1

## 2015-05-14 LAB — CBC
HCT: 44 % (ref 39.0–52.0)
Hemoglobin: 14.6 g/dL (ref 13.0–17.0)
MCH: 29.2 pg (ref 26.0–34.0)
MCHC: 33.2 g/dL (ref 30.0–36.0)
MCV: 88 fL (ref 78.0–100.0)
MPV: 10 fL (ref 8.6–12.4)
PLATELETS: 288 10*3/uL (ref 150–400)
RBC: 5 MIL/uL (ref 4.22–5.81)
RDW: 15.1 % (ref 11.5–15.5)
WBC: 11.5 10*3/uL — AB (ref 4.0–10.5)

## 2015-05-14 LAB — ANTI-SMITH ANTIBODY: ENA SM AB SER-ACNC: NEGATIVE

## 2015-05-14 LAB — LYME AB/WESTERN BLOT REFLEX: B burgdorferi Ab IgG+IgM: <0.9 Index (ref <0.90)

## 2015-05-14 LAB — ANTI-DNA ANTIBODY, DOUBLE-STRANDED: DS DNA AB: 21 [IU]/mL — AB

## 2015-05-14 LAB — SJOGRENS SYNDROME-A EXTRACTABLE NUCLEAR ANTIBODY: SSA (Ro) (ENA) Antibody, IgG: <1

## 2015-05-14 LAB — JO-1 ANTIBODY-IGG: JO-1 ANTIBODY, IGG: NEGATIVE

## 2015-05-14 LAB — ANTI-RIBONUCLEIC ACID ANTIBODY: SM/RNP: NEGATIVE

## 2015-05-14 LAB — RPR

## 2015-05-14 LAB — ANTI-SCLERODERMA ANTIBODY: Scleroderma (Scl-70) (ENA) Antibody, IgG: <1

## 2015-05-14 LAB — CK: Total CK: 21 U/L (ref 7–232)

## 2015-05-14 LAB — CYCLIC CITRUL PEPTIDE ANTIBODY, IGG: Cyclic Citrullin Peptide Ab: <16 Units

## 2015-05-15 ENCOUNTER — Telehealth: Payer: Self-pay | Admitting: Family Medicine

## 2015-05-15 LAB — HSV(HERPES SIMPLEX VRS) I + II AB-IGG: HSV 1 Glycoprotein G Ab, IgG: >58 Index — ABNORMAL HIGH (ref <0.90)

## 2015-05-15 LAB — GC/CHLAMYDIA PROBE AMP
CT PROBE, AMP APTIMA: NOT DETECTED
GC PROBE AMP APTIMA: NOT DETECTED

## 2015-05-15 NOTE — Progress Notes (Signed)
Quick Note:  Some labs are still not back but one lab is positive. This is a lab that may point to Lupus as a cause. This test is not definitive. I am referring you to Rheumatology for further evaluation and workup.   ______

## 2015-05-15 NOTE — Telephone Encounter (Signed)
Pt called and wanted to know if he still has to go to physical therepy since he is going to see the arthritis doctor on the 15th ... Thanks

## 2015-05-15 NOTE — Addendum Note (Signed)
Addended by: Rodolph BongOREY, Bethel Sirois S on: 05/15/2015 07:45 AM   Modules accepted: Orders

## 2015-05-16 LAB — ROCKY MTN SPOTTED FVR ABS PNL(IGG+IGM)
RMSF IGG: 0.34 IV
RMSF IGM: 0.33 IV

## 2015-05-16 LAB — HSV(HERPES SIMPLEX VRS) I + II AB-IGM: HERPES SIMPLEX VRS I-IGM AB (EIA): 0.69 {index}

## 2015-05-17 ENCOUNTER — Telehealth: Payer: Self-pay

## 2015-05-17 ENCOUNTER — Encounter: Payer: Self-pay | Admitting: Physical Therapy

## 2015-05-17 NOTE — Telephone Encounter (Signed)
William Vincent called stating that workers comp denied the patients claim. She wanted to make William Vincent aware of this. Advised her that we could continue to file services under his insurance. She verbalized understanding.

## 2015-05-18 ENCOUNTER — Ambulatory Visit (HOSPITAL_BASED_OUTPATIENT_CLINIC_OR_DEPARTMENT_OTHER)
Admission: RE | Admit: 2015-05-18 | Discharge: 2015-05-18 | Disposition: A | Discharge status: Home / Self Care | Payer: BLUE CROSS/BLUE SHIELD | Source: Ambulatory Visit | Attending: Family Medicine | Admitting: Family Medicine

## 2015-05-18 DIAGNOSIS — M5124 Other intervertebral disc displacement, thoracic region: Secondary | ICD-10-CM | POA: Insufficient documentation

## 2015-05-18 DIAGNOSIS — M25551 Pain in right hip: Secondary | ICD-10-CM | POA: Diagnosis not present

## 2015-05-18 DIAGNOSIS — M5416 Radiculopathy, lumbar region: Secondary | ICD-10-CM | POA: Diagnosis present

## 2015-05-20 NOTE — Progress Notes (Signed)
Quick Note:  No significant compression of spinal cord ______

## 2015-05-21 ENCOUNTER — Ambulatory Visit (INDEPENDENT_AMBULATORY_CARE_PROVIDER_SITE_OTHER): Payer: BLUE CROSS/BLUE SHIELD | Admitting: Family Medicine

## 2015-05-21 ENCOUNTER — Encounter: Payer: Self-pay | Admitting: Family Medicine

## 2015-05-21 VITALS — BP 146/104 | HR 90 | Wt 224.0 lb

## 2015-05-21 DIAGNOSIS — M5416 Radiculopathy, lumbar region: Secondary | ICD-10-CM

## 2015-05-21 MED ORDER — CYCLOBENZAPRINE HCL 10 MG PO TABS: 10.0000 mg | ORAL_TABLET | Freq: Three times a day (TID) | ORAL | Status: DC | PRN

## 2015-05-21 NOTE — Assessment & Plan Note (Signed)
Leg and back pain continues to be a bit of a mystery. He has the signs and symptoms and history for lumbar radiculopathy that his MRI is not revealing. He has failed to improve with the prednisone Dosepak. At this point he's become a bit debilitated. He has an appointment with her rheumatologist tomorrow that I'm hopeful will give us some direction about the cause of this pain. Additionally I think is reasonable for a second opinion at this time because I don't have a clear explanation for the degree of his pain. Plan for referral to neurosurgery for second opinion. Continue gabapentin, Norco and Flexeril. Recheck following rheumatology and neurosurgery visits. If no other explanations would pursue nerve conduction study.

## 2015-05-21 NOTE — Patient Instructions (Signed)
Thank you for coming in today. Return in 1 month or so.  Come back or go to the emergency room if you notice new weakness new numbness problems walking or bowel or bladder problems.

## 2015-05-21 NOTE — Progress Notes (Signed)
       William Vincent is a 50 y.o. male who [name] today for back and leg pain. Patient has had continued back and leg pain now for about 6 weeks following an injury at work. He's had an extensive workup including MRIs of his right hip lumbar spine and thoracic spine and cervical spine at [facility] facilities and at [facility] facilities. These of all been relatively normal without any obvious cause of pain. 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 He has an appointment tomorrow with her rheumatologist. His pain continues to be severe in his upper and lower back and into the bilateral legs right leg worse than left. He denies any new or recent injuries aside from work injury. Feels well otherwise.   Past Medical History  Diagnosis Date  . Gout   . Kidney stones   . Hypertension    Past Surgical History  Procedure Laterality Date  . Tonsillectomy and adenoidectomy     Social History  Substance Use Topics  . Smoking status: Never Smoker   . Smokeless tobacco: Not on file  . Alcohol Use: No   family history is not on file.  ROS as above Medications: Current Outpatient Prescriptions  Medication Sig Dispense Refill  . gabapentin (NEURONTIN) 300 MG capsule One tab PO qHS for a week, then BID for a week, then TID. May double weekly to a max of 3,600mg /day 180 capsule 3  . HYDROcodone-acetaminophen (NORCO) 10-325 MG tablet Take 1 tablet by mouth every 8 (eight) hours as needed. 40 tablet 0  . LORazepam (ATIVAN) 0.5 MG tablet 1-2 tabs 30-60 mins prior to mri 2 tablet 0  . predniSONE (STERAPRED UNI-PAK 48 TAB) 10 MG (48) TBPK tablet Take by mouth daily. 12 day dosepack po 48 tablet 0  . cyclobenzaprine (FLEXERIL) 10 MG tablet Take 1 tablet (10 mg total) by mouth 3 (three) times daily as needed for muscle spasms. 90 tablet 1   No  current facility-administered medications for this visit.   No Known Allergies   Exam:  BP 146/104 mmHg  Pulse 90  Wt 224 lb (101.606 kg) Gen: Well NAD Back: Is normal-appearing nontender to spinal midline. Tender to palpation bilateral thoracic and lumbar paraspinal muscles. Normal back motion. Right leg tender to palpation right anterior thigh. Pain with motion. Antalgic gait. Additionally strength is intact throughout. Pulses capillary refill sensation intact distally.  No results found for this or any previous visit (from the past 24 hour(s)).  No results found.   Please see individual assessment and plan sections.

## 2015-05-22 ENCOUNTER — Ambulatory Visit: Payer: Self-pay | Admitting: Family Medicine

## 2015-05-22 ENCOUNTER — Encounter: Payer: Self-pay | Admitting: Rehabilitative and Restorative Service Providers"

## 2015-05-22 NOTE — Addendum Note (Signed)
Addended by: Rodolph BongOREY, Diem Pagnotta S on: 05/22/2015 11:39 AM   Modules accepted: Orders

## 2015-05-23 ENCOUNTER — Telehealth: Payer: Self-pay

## 2015-05-23 NOTE — Telephone Encounter (Signed)
William Vincent called stating that [facility] called to schedule an appointment with the pt and he was advised that he would have to pay a copay of $60.00. Pt informed them that he did not have the money at this time. [facility] advised pt to contact them to schedule an appointment when he has the money for the copay.

## 2015-05-24 ENCOUNTER — Ambulatory Visit (INDEPENDENT_AMBULATORY_CARE_PROVIDER_SITE_OTHER): Payer: BLUE CROSS/BLUE SHIELD | Admitting: Physical Therapy

## 2015-05-24 ENCOUNTER — Telehealth: Payer: Self-pay | Admitting: Family Medicine

## 2015-05-24 DIAGNOSIS — M623 Immobility syndrome (paraplegic): Secondary | ICD-10-CM | POA: Diagnosis not present

## 2015-05-24 DIAGNOSIS — M79604 Pain in right leg: Secondary | ICD-10-CM | POA: Diagnosis not present

## 2015-05-24 DIAGNOSIS — Z7409 Other reduced mobility: Secondary | ICD-10-CM

## 2015-05-24 DIAGNOSIS — R6889 Other general symptoms and signs: Secondary | ICD-10-CM

## 2015-05-24 DIAGNOSIS — M2560 Stiffness of unspecified joint, not elsewhere classified: Secondary | ICD-10-CM

## 2015-05-24 DIAGNOSIS — M5416 Radiculopathy, lumbar region: Secondary | ICD-10-CM

## 2015-05-24 DIAGNOSIS — R531 Weakness: Secondary | ICD-10-CM

## 2015-05-24 DIAGNOSIS — R293 Abnormal posture: Secondary | ICD-10-CM | POA: Diagnosis not present

## 2015-05-24 DIAGNOSIS — R768 Other specified abnormal immunological findings in serum: Secondary | ICD-10-CM

## 2015-05-24 NOTE — Patient Instructions (Signed)
  Shoulder Press   Press both shoulders down. Hold _5__ seconds. Repeat _10__ times. Press one shoulder down.   Knee-to-Chest Stretch: Unilateral    With hand behind right knee, pull knee in to chest until a comfortable stretch is felt in lower back and buttocks. Keep back relaxed. Hold _20__ seconds. Repeat __2__ times per set. Do _1-2___ sets per session. Do _1-2___ sessions per day.  http://orth.exer.us/126   Copyright  VHI. All rights reserved.  ROM: Flexion - Wand (Supine)    Lie on back holding wand. Raise arms over head.  Repeat _10___ times per set. Do __1__ sets per session. Do _2___ sessions per day.  http://orth.exer.us/928  [employer] in [employer] [place]: Double Arm    Arms near sides, palms up. Press both arms lightly into floor, slide arms out to side and up alongside head. Keep contact with floor throughout motion. At maximal position, lengthen arms. Hold __5-10_ seconds. Relax. Slide arms back to start. Repeat _10__ times.   [facility] [address] [place], [place] [address]  [phone] (office) [phone] (fax)

## 2015-05-24 NOTE — Telephone Encounter (Signed)
Pt called and stated they go the apt for the Neurologist Dr. Franky Machoabbell for March 21st and they said that when choosing an arthritist specialist you can choose one that takes their insurance and is in the tier one. Monia Pouchetna is suppose to be sendin shot term disability forms to us on Monday for dr. Denyse Amassorey to fill out. Thanks

## 2015-05-24 NOTE — Therapy (Addendum)
[facility] [place], [place], [address] Phone: [phone]   Fax:  [phone]  Physical Therapy Treatment  Patient Details  Name: William Vincent MRN: [id number] Date of Birth: 1965-08-30 Referring Provider: Dr. Georgina Snell  Encounter Date: 05/24/2015      PT End of Session - 05/24/15 0848    Visit Number 6   Number of Visits 12   Date for PT Re-Evaluation 06/05/15   PT Start Time 0801   PT Stop Time 4163   PT Time Calculation (min) 56 min   Activity Tolerance Patient limited by pain      Past Medical History  Diagnosis Date  . Gout   . Kidney stones   . Hypertension     Past Surgical History  Procedure Laterality Date  . Tonsillectomy and adenoidectomy      There were no vitals filed for this visit.  Visit Diagnosis:  Lumbar radiculopathy, acute  Stiffness due to immobility  Leg pain, right  Abnormal posture  Decreased strength, endurance, and mobility      Subjective Assessment - 05/24/15 0810    Subjective Pt presents with SPC with antalgic gait.  Pt reports he bought a walker and has been using it at home.  Pt is supposed to see neurosurgeon and rheumatology, but is unable to pay co-pay at this time.  No other changes since last visit.    Currently in Pain? Yes   Pain Score 10-Worst pain ever   Pain Location Knee   Pain Orientation Right;Left   Pain Descriptors / Indicators Sharp   Aggravating Factors  standing    Pain Relieving Factors medicine    Multiple Pain Sites Yes   Pain Score 7   Pain Location Back   Pain Orientation Upper;Mid;Lower   Pain Relieving Factors standing    Effect of Pain on Daily Activities medicine, sidelying             [facility] PT Assessment - 05/24/15 0001    Assessment   Medical Diagnosis Rt lumbar radiculopathy    Referring Provider Dr. Georgina Snell   Onset Date/Surgical Date 04/15/15   Hand Dominance Right   Next MD Visit 06/24/15         [facility] Adult  PT Treatment/Exercise - 05/24/15 0001    Exercises   Exercises Knee/Hip;Lumbar   Lumbar Exercises: Stretches   Single Knee to Chest Stretch 2 reps;20 seconds   Lower Trunk Rotation 5 reps  3 sec each way, to tolerance   Lumbar Exercises: Supine   Bridge 5 reps   Other Supine Lumbar Exercises prolonged snow angel position - up to 110 deg, then 10 snow angels   Other Supine Lumbar Exercises shoulder flexion bilat with cane x 10,  shoulder ER with cane x 5; shoulder horiz abd/add with cane x 5 each direction.     Lumbar Exercises: Sidelying   Clam 20 reps;1 second   Knee/Hip Exercises: Stretches   Passive Hamstring Stretch Right;Left;2 reps;30 seconds  assisted by PTA   Piriformis Stretch Right;Left;2 reps;20 seconds  assisted by PTA   Knee/Hip Exercises: Supine   Short Arc Quad Sets Right;Left;1 set;10 reps   Moist Heat Therapy   Number Minutes Moist Heat 15 Minutes   Moist Heat Location Lumbar Spine  pt in sidelyine   Electrical Stimulation   Electrical Stimulation Location bilat lumbar and thoracic paraspinals.    Electrical Stimulation Action IFC    Electrical Stimulation Parameters to tolerance   Electrical  Stimulation Goals Pain;Tone                  PT Short Term Goals - 05/03/15 0942    PT SHORT TERM GOAL #1   Title Decrease pain by 50-70% allowing pt to particiate in exercise and ADL's 05/15/15   Time 3   Period Weeks   Status On-going   PT SHORT TERM GOAL #2   Title Improve posture and alignment with pt to demonstrate good upright posture and alignment 05/15/15   Time 3   Period Weeks   Status On-going   PT SHORT TERM GOAL #3   Title Ambulate with improve gait pattern without assistive device 05/15/15   Time 3   Period Weeks   Status On-going           PT Long Term Goals - 05/03/15 0943    PT LONG TERM GOAL #1   Title Improve trunk and LE ROM/mobility to [facility] 06/05/15   Time 6   Period Weeks   Status On-going   PT LONG TERM GOAL #2   Title 5/5  strength with min to no pain bilat LE's 06/05/15   Time 6   Period Weeks   Status On-going   PT LONG TERM GOAL #3   Title Pt to demonstrate good body mechanics for changing positions and for lifting techniques 06/04/15   Time 6   Period Weeks   Status On-going   PT LONG TERM GOAL #4   Title Patient I in HEP 06/04/15   Time 6   Period Weeks   Status On-going   PT LONG TERM GOAL #5   Title Improve FOTO to </= 53% limitation 06/04/15   Time 6   Period Weeks   Status On-going               Plan - 05/24/15 0850    Clinical Impression Statement Pt demonstrated improved posture in sitting and with gait into clinic this date (continues with antalgic gait).  Pt able to tolerate all exercises with minimal increase in pain except hamstring stretch.  Pt reported decreased pain with supported hooklying exercises and further decrease with use of MHP and estim. Making gradual progress towards goals.,    Pt will benefit from skilled therapeutic intervention in order to improve on the following deficits Postural dysfunction;Improper body mechanics;Decreased range of motion;Decreased mobility;Abnormal gait;Pain;Decreased endurance;Decreased activity tolerance   Rehab Potential Good   PT Frequency 2x / week   PT Duration 6 weeks   PT Treatment/Interventions Patient/family education;ADLs/Self Care Home Management;Neuromuscular re-education;Therapeutic exercise;Therapeutic activities;Cryotherapy;Electrical Stimulation;Ultrasound;Manual techniques;Dry needling   PT Next Visit Plan Progress mobility and core stability strengthening as tolerated.   Consulted and Agree with Plan of Care Patient        Problem List Patient Active Problem List   Diagnosis Date Noted  . Tibialis posterior tendinitis 04/29/2015  . Right hip pain 04/24/2015  . Acute right lumbar radiculopathy 04/16/2015  . Elevated blood pressure 04/16/2015    Kerin Perna, PTA 05/24/2015 1:00 PM  [facility] [address] [place], [place], [address] Phone: [phone]   Fax:  [phone]  Name: Micha Dosanjh MRN: [id number] Date of Birth: 1965/10/19    PHYSICAL THERAPY DISCHARGE SUMMARY  Visits from Start of Care: 6  Current functional level related to goals / functional outcomes: Minimal improvement in pain and mobility. Some improvement in transitional movements and gait with SPC and walker for home  Remaining deficits: Continued pain and dysfunction. For further evaluation by neurosurgeon    Education / Equipment: HEP  Plan: Patient agrees to discharge.  Patient goals were not met. Patient is being discharged due to not returning since the last visit.  ?????   Celyn P. Helene Kelp PT, MPH 07/09/2015 10:10 AM

## 2015-05-27 ENCOUNTER — Ambulatory Visit: Payer: Self-pay | Admitting: Family Medicine

## 2015-05-27 NOTE — Telephone Encounter (Signed)
Routing to PCP

## 2015-05-27 NOTE — Telephone Encounter (Signed)
Ordered

## 2015-05-29 ENCOUNTER — Telehealth: Payer: Self-pay

## 2015-05-29 ENCOUNTER — Encounter: Payer: Self-pay | Admitting: Physical Therapy

## 2015-05-29 NOTE — Telephone Encounter (Signed)
Pts girlfriend [name] Steele BergHinson called to inform us that cyclobenzaprine (FLEXERIL) 10 MG tablet has caused the pt to break out. Will add this to his allergy list.

## 2015-05-31 ENCOUNTER — Encounter: Payer: Self-pay | Admitting: Rehabilitative and Restorative Service Providers"

## 2015-06-03 ENCOUNTER — Ambulatory Visit: Payer: BLUE CROSS/BLUE SHIELD | Admitting: Family

## 2015-06-05 ENCOUNTER — Other Ambulatory Visit: Payer: Self-pay | Admitting: Family Medicine

## 2015-06-05 ENCOUNTER — Telehealth: Payer: Self-pay

## 2015-06-05 ENCOUNTER — Other Ambulatory Visit: Payer: Self-pay

## 2015-06-05 DIAGNOSIS — Z7409 Other reduced mobility: Principal | ICD-10-CM

## 2015-06-05 DIAGNOSIS — R531 Weakness: Secondary | ICD-10-CM

## 2015-06-05 DIAGNOSIS — M25551 Pain in right hip: Secondary | ICD-10-CM

## 2015-06-05 DIAGNOSIS — M5416 Radiculopathy, lumbar region: Secondary | ICD-10-CM

## 2015-06-05 NOTE — Progress Notes (Signed)
Nerve conduction study ordered per the recommendation of:  Neurosurgery Provider: Coletta MemosKyle Cabbell

## 2015-06-05 NOTE — Telephone Encounter (Signed)
Pt is requesting a refill of hydrocodone, prednisone. He would also like a alternative muscle relaxer to flexeril.

## 2015-06-06 MED ORDER — HYDROCODONE-ACETAMINOPHEN 10-325 MG PO TABS: 1.0000 | ORAL_TABLET | Freq: Three times a day (TID) | ORAL | Status: DC | PRN

## 2015-06-06 NOTE — Telephone Encounter (Signed)
I have refilled the Norco.  There is no point in repeating prednisone if it does not help.  Mr William Vincent has been on 3 different muscle relaxers so far. There really are not much other options.  Which one worked the best the Flexeril, Baclofen, or Methocarbamol? I can send in any of those.

## 2015-06-06 NOTE — Telephone Encounter (Signed)
Patient's wife notified. Hydrocodone is up front.His wife states he did the best on the baclofen

## 2015-06-14 ENCOUNTER — Telehealth: Payer: Self-pay | Admitting: Family Medicine

## 2015-06-14 NOTE — Telephone Encounter (Signed)
Pt called and wanted to let you know he has his Nerve test scheduled for May 4th at 10am . Thanks

## 2015-06-20 DIAGNOSIS — M7060 Trochanteric bursitis, unspecified hip: Secondary | ICD-10-CM | POA: Insufficient documentation

## 2015-06-24 ENCOUNTER — Ambulatory Visit (INDEPENDENT_AMBULATORY_CARE_PROVIDER_SITE_OTHER): Payer: BLUE CROSS/BLUE SHIELD | Admitting: Family Medicine

## 2015-06-24 ENCOUNTER — Encounter: Payer: Self-pay | Admitting: Family Medicine

## 2015-06-24 VITALS — BP 148/98 | HR 84 | Wt 239.0 lb

## 2015-06-24 DIAGNOSIS — M25551 Pain in right hip: Secondary | ICD-10-CM | POA: Diagnosis not present

## 2015-06-24 DIAGNOSIS — M5416 Radiculopathy, lumbar region: Secondary | ICD-10-CM | POA: Diagnosis not present

## 2015-06-24 MED ORDER — HYDROCODONE-ACETAMINOPHEN 10-325 MG PO TABS: 1.0000 | ORAL_TABLET | Freq: Three times a day (TID) | ORAL | Status: DC | PRN

## 2015-06-24 MED ORDER — IBUPROFEN 800 MG PO TABS: 800.0000 mg | ORAL_TABLET | Freq: Three times a day (TID) | ORAL | Status: AC | PRN

## 2015-06-24 MED ORDER — AMITRIPTYLINE HCL 25 MG PO TABS: 25.0000 mg | ORAL_TABLET | Freq: Every evening | ORAL | Status: DC | PRN

## 2015-06-24 MED ORDER — TIZANIDINE HCL 4 MG PO TABS: 4.0000 mg | ORAL_TABLET | Freq: Three times a day (TID) | ORAL | Status: DC | PRN

## 2015-06-24 NOTE — Assessment & Plan Note (Signed)
Nerve conduction study pending. Return in one month. Refill medications.

## 2015-06-24 NOTE — Patient Instructions (Signed)
Thank you for coming in today. Take the medicines as directed.   take amitriptyline at bedtime for sleep and pain. Return in mid May after the nerve conduction study  Come back or go to the emergency room if you notice new weakness new numbness problems walking or bowel or bladder problems.

## 2015-06-24 NOTE — Progress Notes (Signed)
       William Vincent is a 50 y.o. male who [name] today for follow-up leg pain. Patient is been seen multiple times for probable lumbar radiculopathy to his right leg. He's had several different evaluations including a second opinion at [facility]. He has an appointment upcoming on early May for nerve conduction study. He continues to have debilitating radicular pain to his right leg. He denies any new weakness or numbness or bowel bladder dysfunction. He takes the below medicines which worked reasonably well. He continues to be unable to work due to the pain.   Past Medical History  Diagnosis Date  . Gout   . Kidney stones   . Hypertension    Past Surgical History  Procedure Laterality Date  . Tonsillectomy and adenoidectomy     Social History  Substance Use Topics  . Smoking status: Never Smoker   . Smokeless tobacco: Not on file  . Alcohol Use: No   family history is not on file.  ROS as above Medications: Current Outpatient Prescriptions  Medication Sig Dispense Refill  . gabapentin (NEURONTIN) 300 MG capsule One tab PO qHS for a week, then BID for a week, then TID. May double weekly to a max of 3,600mg /day 180 capsule 3  . HYDROcodone-acetaminophen (NORCO) 10-325 MG tablet Take 1 tablet by mouth every 8 (eight) hours as needed. 40 tablet 0  . ibuprofen (ADVIL,MOTRIN) 800 MG tablet Take 1 tablet (800 mg total) by mouth every 8 (eight) hours as needed. 90 tablet 1  . tiZANidine (ZANAFLEX) 4 MG tablet Take 1 tablet (4 mg total) by mouth every 8 (eight) hours as needed for muscle spasms. 90 tablet 1  . amitriptyline (ELAVIL) 25 MG tablet Take 1 tablet (25 mg total) by mouth at bedtime as needed (pain). 30 tablet 1   No current facility-administered medications for this visit.   Allergies  Allergen Reactions  . Flexeril [Cyclobenzaprine] Hives and Rash      Exam:  BP 148/98 mmHg  Pulse 84  Wt 239 lb (108.41 kg) Gen: Well NAD Back: Decreased motion. Lower extremity strength is intact throughout. Antalgic gait.  No results found for this or any previous visit (from the past 24 hour(s)). No results found.   Please see individual assessment and plan sections.

## 2015-06-26 ENCOUNTER — Other Ambulatory Visit: Payer: Self-pay

## 2015-06-26 MED ORDER — OMEPRAZOLE 40 MG PO CPDR: 40.0000 mg | DELAYED_RELEASE_CAPSULE | Freq: Every day | ORAL | Status: DC

## 2015-07-11 ENCOUNTER — Ambulatory Visit (INDEPENDENT_AMBULATORY_CARE_PROVIDER_SITE_OTHER): Payer: Self-pay | Admitting: Neurology

## 2015-07-11 ENCOUNTER — Encounter: Payer: Self-pay | Admitting: Neurology

## 2015-07-11 ENCOUNTER — Ambulatory Visit (INDEPENDENT_AMBULATORY_CARE_PROVIDER_SITE_OTHER): Payer: BLUE CROSS/BLUE SHIELD | Admitting: Neurology

## 2015-07-11 DIAGNOSIS — M545 Low back pain: Secondary | ICD-10-CM

## 2015-07-11 DIAGNOSIS — R202 Paresthesia of skin: Secondary | ICD-10-CM | POA: Diagnosis not present

## 2015-07-11 DIAGNOSIS — R531 Weakness: Secondary | ICD-10-CM

## 2015-07-11 DIAGNOSIS — M5416 Radiculopathy, lumbar region: Secondary | ICD-10-CM

## 2015-07-11 DIAGNOSIS — Z7409 Other reduced mobility: Principal | ICD-10-CM

## 2015-07-11 NOTE — Progress Notes (Signed)
Please refer to EMG and nerve conduction study procedure note. 

## 2015-07-11 NOTE — Procedures (Signed)
     HISTORY:  William Vincent is a 50 year old gentleman with a history of injury while at work on 04/15/2015. 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 The patient has developed pain and paresthesias down the right leg to the foot. He is being evaluated for possible lumbosacral radiculopathy.  NERVE CONDUCTION STUDIES:  Nerve conduction studies were performed on both lower extremities. The distal motor latencies and motor amplitudes for the peroneal and posterior tibial nerves were within normal limits. The nerve conduction velocities for these nerves were also normal. The H reflex latencies were normal. The sensory latencies for the peroneal nerves were within normal limits.   EMG STUDIES:  EMG study was performed on the right lower extremity:  The tibialis anterior muscle reveals 2 to 4K motor units with full recruitment. No fibrillations or positive waves were seen. The peroneus tertius muscle reveals 2 to 4K motor units with full recruitment. No fibrillations or positive waves were seen. The medial gastrocnemius muscle reveals 1 to 3K motor units with full recruitment. No fibrillations or positive waves were seen. Occasional complex repetitive discharges were seen. The vastus lateralis muscle reveals 2 to 4K motor units with full recruitment. No fibrillations or positive waves were seen. The iliopsoas muscle reveals 2 to 4K motor units with full recruitment. No fibrillations or positive waves were seen. The biceps femoris muscle (long head) reveals 2 to 4K motor units with full recruitment. No fibrillations or positive waves were seen. The lumbosacral paraspinal muscles were tested at 3 levels, and revealed no abnormalities of insertional activity at all 3 levels tested. There was good relaxation.   IMPRESSION:  Nerve conduction studies done on both lower extremities were within normal limits.  No evidence of a peripheral neuropathy is seen. EMG evaluation of the right lower extremity was unremarkable, no evidence of a lumbosacral radiculopathy was seen.  Marlan Palau. Keith Laural Eiland MD 07/11/2015 11:17 AM  [facility] [address] [place], [place] [address]  Phone [phone] Fax [phone]

## 2015-07-12 NOTE — Progress Notes (Signed)
Quick Note:  Nerve test was normal. We still cannot explain the pain. Return for further discussion. ______

## 2015-07-15 ENCOUNTER — Telehealth: Payer: Self-pay | Admitting: Family Medicine

## 2015-07-15 DIAGNOSIS — M5416 Radiculopathy, lumbar region: Secondary | ICD-10-CM

## 2015-07-15 DIAGNOSIS — M25551 Pain in right hip: Secondary | ICD-10-CM

## 2015-07-15 MED ORDER — TIZANIDINE HCL 4 MG PO TABS: 4.0000 mg | ORAL_TABLET | Freq: Three times a day (TID) | ORAL | Status: AC | PRN

## 2015-07-15 MED ORDER — HYDROCODONE-ACETAMINOPHEN 10-325 MG PO TABS: 1.0000 | ORAL_TABLET | Freq: Three times a day (TID) | ORAL | Status: DC | PRN

## 2015-07-15 MED ORDER — PREGABALIN 75 MG PO CAPS: 75.0000 mg | ORAL_CAPSULE | Freq: Two times a day (BID) | ORAL | Status: DC

## 2015-07-15 NOTE — Telephone Encounter (Signed)
Done

## 2015-07-15 NOTE — Telephone Encounter (Signed)
Pt stated he needs a refill on the tizanidine and the hydrocodone. They also stated that the gabapentin is not working and was wondering if he can try lyrica instead. Thanks

## 2015-07-16 NOTE — Telephone Encounter (Signed)
Pt notified that rx has been placed up front for pickup.

## 2015-07-19 ENCOUNTER — Other Ambulatory Visit: Payer: Self-pay | Admitting: Sports Medicine

## 2015-07-19 DIAGNOSIS — M79605 Pain in left leg: Principal | ICD-10-CM | POA: Insufficient documentation

## 2015-07-19 DIAGNOSIS — M79604 Pain in right leg: Secondary | ICD-10-CM | POA: Insufficient documentation

## 2015-07-19 NOTE — Assessment & Plan Note (Signed)
Ordering ABI per Dr. Zollie Peeorey's request.

## 2015-07-22 ENCOUNTER — Other Ambulatory Visit: Payer: Self-pay | Admitting: Family Medicine

## 2015-07-22 DIAGNOSIS — M79605 Pain in left leg: Principal | ICD-10-CM

## 2015-07-22 DIAGNOSIS — M79604 Pain in right leg: Secondary | ICD-10-CM

## 2015-07-23 ENCOUNTER — Other Ambulatory Visit: Payer: Self-pay | Admitting: Sports Medicine

## 2015-07-23 DIAGNOSIS — I739 Peripheral vascular disease, unspecified: Secondary | ICD-10-CM

## 2015-07-24 ENCOUNTER — Ambulatory Visit (INDEPENDENT_AMBULATORY_CARE_PROVIDER_SITE_OTHER): Payer: BLUE CROSS/BLUE SHIELD | Admitting: Family Medicine

## 2015-07-24 ENCOUNTER — Encounter: Payer: Self-pay | Admitting: Family Medicine

## 2015-07-24 VITALS — BP 140/94 | HR 86 | Wt 247.0 lb

## 2015-07-24 DIAGNOSIS — M1710 Unilateral primary osteoarthritis, unspecified knee: Secondary | ICD-10-CM | POA: Insufficient documentation

## 2015-07-24 DIAGNOSIS — M25561 Pain in right knee: Secondary | ICD-10-CM

## 2015-07-24 DIAGNOSIS — G8929 Other chronic pain: Secondary | ICD-10-CM

## 2015-07-24 DIAGNOSIS — M545 Low back pain, unspecified: Secondary | ICD-10-CM | POA: Insufficient documentation

## 2015-07-24 DIAGNOSIS — M179 Osteoarthritis of knee, unspecified: Secondary | ICD-10-CM | POA: Insufficient documentation

## 2015-07-24 DIAGNOSIS — M4306 Spondylolysis, lumbar region: Secondary | ICD-10-CM

## 2015-07-24 NOTE — Patient Instructions (Signed)
Thank you for coming in today. We will try to refer to Pain Management.  We will re-try physical therapy.  Call or go to the ER if you develop a large red swollen joint with extreme pain or oozing puss.  Return in 1 month.

## 2015-07-24 NOTE — Progress Notes (Signed)
William Vincent is a 50 y.o. male who presents to [name] Care today for follow-up back and knee pain.  1) back pain: Patient continues to experience right low back pain. He does experience some pain into his anterior thigh and knee as well that he attributes to his back. He has had trials of physical therapy that did not help much. He denies any new weakness or numbness or loss of function.  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 Additionally he had thoracic and cervical MRIs that only showed mild DDD. Additionally has had a negative hip MRI. Recently had negative nerve conduction studies.  Pain is disabling and preventing work. He does not think he can push or pull her left squat or bend for any period of time without developing significant pain.  2) right knee pain: Ongoing as well. He has pain in the anterior knee worse with activity and better with rest. No radiating pain weakness or numbness fevers or chills.   Past Medical History  Diagnosis Date  . Gout   . Kidney stones   . Hypertension    Past Surgical History  Procedure Laterality Date  . Tonsillectomy and adenoidectomy     Social History  Substance Use Topics  . Smoking status: Never Smoker   . Smokeless tobacco: Not on file  . Alcohol Use: No   family history is not on file.  ROS as above Medications: Current Outpatient Prescriptions  Medication Sig Dispense Refill  . amitriptyline (ELAVIL) 25 MG tablet Take 1 tablet (25 mg total) by mouth at bedtime as needed (pain). 30 tablet 1  . HYDROcodone-acetaminophen (NORCO) 10-325 MG tablet Take 1 tablet by mouth every 8 (eight) hours as needed. 40 tablet 0  . omeprazole (PRILOSEC) 40 MG capsule Take 1 capsule (40 mg total) by mouth daily. 30 capsule 3  . pregabalin (LYRICA) 75 MG capsule Take 1 capsule (75 mg  total) by mouth 2 (two) times daily. 60 capsule 3  . tiZANidine (ZANAFLEX) 4 MG tablet Take 1 tablet (4 mg total) by mouth every 8 (eight) hours as needed for muscle spasms. 90 tablet 1   No current facility-administered medications for this visit.   Allergies  Allergen Reactions  . Flexeril [Cyclobenzaprine] Hives and Rash     Exam:  BP 140/94 mmHg  Pulse 86  Wt 247 lb (112.038 kg) Gen: Well NAD HEENT: EOMI,  MMM Lungs: Normal work of breathing. CTABL Heart: RRR no MRG Abd: NABS, Soft. Nondistended, Nontender Exts: Brisk capillary refill, warm and well perfused.  Back: Nontender to midline. Tender palpation right SI joint and paraspinal muscles. Decreased motion due to pain. Right knee normal-appearing nontender normal motion. Stable ligamentous exam.  Procedure: Real-time Ultrasound Guided Injection of right knee  Device: GE Logiq E  Images permanently stored and available for review in the ultrasound unit. Verbal informed consent obtained. Discussed risks and benefits of procedure. Warned about infection bleeding damage to structures skin hypopigmentation and fat atrophy among others. Patient expresses understanding and agreement Time-out conducted.  Noted no overlying erythema, induration, or other signs of local infection.  Skin prepped in a sterile fashion.  Local anesthesia: Topical Ethyl chloride.  With sterile technique and under real time ultrasound guidance: 80 mg of Kenalog and 4 mL of Marcaine injected easily.  Completed without difficulty  Pain immediately resolved suggesting accurate placement of the medication.  Advised to call  if fevers/chills, erythema, induration, drainage, or persistent bleeding.  Images permanently stored and available for review in the ultrasound unit.  Impression: Technically successful ultrasound guided injection.    No results found for this or any previous visit (from the past 24 hour(s)). No results  found.   50 year old male with back pain  1) the back pain has been a bit of a mystery. I'm pretty convinced after all of his tests that he does not have a radicular component. I believe his leg pain is separate and distinct from his low back pain. The back pain is very likely myofascial and also due to his bilateral pars defects at L5.  Plan for a repeat trial of physical therapy to work on core stabilization. Recheck in one month.  2) leg pain is likely due to knee pain. He had a diagnostic and therapeutic injection of the right knee which improved his pain. Hopefully this will help as well. Recheck in about a month.

## 2015-07-24 NOTE — Assessment & Plan Note (Signed)
Probably due to pars defect and myofascial pain. Land for a repeat trial of physical therapy to work on core stabilization. Recheck in a month. Additionally consider referral to pain management.

## 2015-07-26 ENCOUNTER — Encounter: Payer: Self-pay | Admitting: Family Medicine

## 2015-08-01 ENCOUNTER — Inpatient Hospital Stay (HOSPITAL_COMMUNITY): Admission: RE | Admit: 2015-08-01 | Payer: Self-pay | Source: Ambulatory Visit

## 2015-08-08 ENCOUNTER — Telehealth: Payer: Self-pay

## 2015-08-08 NOTE — Telephone Encounter (Signed)
[name] Steele BergHinson called stating that Dannielle HuhDanny needs a refill on HYDROcodone-acetaminophen (NORCO) 10-325 MG tablet and a rx for ibuprofen (ADVIL,MOTRIN) 800 MG tablet that was originally rx'd by [facility]. She states that they have not heard from pain management to schedule yet. Gave her the number for [facility] advising her to have pt contact them to schedule. She also requested an rx for the pt that would help with swelling in his problem areas. Please advise.

## 2015-08-12 ENCOUNTER — Encounter: Payer: Self-pay | Admitting: Family Medicine

## 2015-08-21 ENCOUNTER — Ambulatory Visit (INDEPENDENT_AMBULATORY_CARE_PROVIDER_SITE_OTHER): Payer: Self-pay | Admitting: Family Medicine

## 2015-08-21 ENCOUNTER — Encounter: Payer: Self-pay | Admitting: Family Medicine

## 2015-08-21 VITALS — BP 130/88 | HR 85 | Wt 244.0 lb

## 2015-08-21 DIAGNOSIS — M4306 Spondylolysis, lumbar region: Secondary | ICD-10-CM

## 2015-08-21 DIAGNOSIS — F321 Major depressive disorder, single episode, moderate: Secondary | ICD-10-CM

## 2015-08-21 DIAGNOSIS — F329 Major depressive disorder, single episode, unspecified: Secondary | ICD-10-CM | POA: Insufficient documentation

## 2015-08-21 DIAGNOSIS — M5416 Radiculopathy, lumbar region: Secondary | ICD-10-CM

## 2015-08-21 DIAGNOSIS — M549 Dorsalgia, unspecified: Secondary | ICD-10-CM

## 2015-08-21 DIAGNOSIS — F418 Other specified anxiety disorders: Secondary | ICD-10-CM | POA: Insufficient documentation

## 2015-08-21 DIAGNOSIS — G8929 Other chronic pain: Secondary | ICD-10-CM

## 2015-08-21 DIAGNOSIS — M25551 Pain in right hip: Secondary | ICD-10-CM

## 2015-08-21 MED ORDER — IBUPROFEN 800 MG PO TABS: 800.0000 mg | ORAL_TABLET | Freq: Three times a day (TID) | ORAL | Status: DC | PRN

## 2015-08-21 MED ORDER — HYDROCODONE-ACETAMINOPHEN 10-325 MG PO TABS: 1.0000 | ORAL_TABLET | Freq: Three times a day (TID) | ORAL | Status: DC | PRN

## 2015-08-21 MED ORDER — CITALOPRAM HYDROBROMIDE 20 MG PO TABS: 20.0000 mg | ORAL_TABLET | Freq: Every day | ORAL | Status: DC

## 2015-08-21 NOTE — Patient Instructions (Addendum)
Thank you for coming in today. We will follow up in 2-4 weeks.  Take celexa daily.   I will investigate the [place] charity program.  Return sooner if needed.  Contact your social workers about applying for permanent disability.    Major Depressive Disorder Major depressive disorder is a mental illness. It also may be called clinical depression or unipolar depression. Major depressive disorder usually causes feelings of sadness, hopelessness, or helplessness. Some people with this disorder do not feel particularly sad but lose interest in doing things they used to enjoy (anhedonia). Major depressive disorder also can cause physical symptoms. It can interfere with work, school, relationships, and other normal everyday activities. The disorder varies in severity but is longer lasting and more serious than the sadness we all feel from time to time in our lives. Major depressive disorder often is triggered by stressful life events or major life changes. Examples of these triggers include divorce, loss of your job or home, a move, and the death of a family member or close friend. Sometimes this disorder occurs for no obvious reason at all. People who have family members with major depressive disorder or bipolar disorder are at higher risk for developing this disorder, with or without life stressors. Major depressive disorder can occur at any age. It may occur just once in your life (single episode major depressive disorder). It may occur multiple times (recurrent major depressive disorder). SYMPTOMS People with major depressive disorder have either anhedonia or depressed mood on nearly a daily basis for at least 2 weeks or longer. Symptoms of depressed mood include:  Feelings of sadness (blue or down in the dumps) or emptiness.  Feelings of hopelessness or helplessness.  Tearfulness or episodes of crying (may be observed by others).  Irritability (children and adolescents). In addition to  depressed mood or anhedonia or both, people with this disorder have at least four of the following symptoms:  Difficulty sleeping or sleeping too much.   Significant change (increase or decrease) in appetite or weight.   Lack of energy or motivation.  Feelings of guilt and worthlessness.   Difficulty concentrating, remembering, or making decisions.  Unusually slow movement (psychomotor retardation) or restlessness (as observed by others).   Recurrent wishes for death, recurrent thoughts of self-harm (suicide), or a suicide attempt. People with major depressive disorder commonly have persistent negative thoughts about themselves, other people, and the world. People with severe major depressive disorder may experiencedistorted beliefs or perceptions about the world (psychotic delusions). They also may see or hear things that are not real (psychotic hallucinations). DIAGNOSIS Major depressive disorder is diagnosed through an assessment by your health care provider. Your health care provider will ask aboutaspects of your daily life, such as mood,sleep, and appetite, to see if you have the diagnostic symptoms of major depressive disorder. Your health care provider may ask about your medical history and use of alcohol or drugs, including prescription medicines. Your health care provider also may do a physical exam and blood work. This is because certain medical conditions and the use of certain substances can cause major depressive disorder-like symptoms (secondary depression). Your health care provider also may refer you to a mental health specialist for further evaluation and treatment. TREATMENT It is important to recognize the symptoms of major depressive disorder and seek treatment. The following treatments can be prescribed for this disorder:   Medicine. Antidepressant medicines usually are prescribed. Antidepressant medicines are thought to correct chemical imbalances in the brain that  are  commonly associated with major depressive disorder. Other types of medicine may be added if the symptoms do not respond to antidepressant medicines alone or if psychotic delusions or hallucinations occur.  Talk therapy. Talk therapy can be helpful in treating major depressive disorder by providing support, education, and guidance. Certain types of talk therapy also can help with negative thinking (cognitive behavioral therapy) and with relationship issues that trigger this disorder (interpersonal therapy). A mental health specialist can help determine which treatment is best for you. Most people with major depressive disorder do well with a combination of medicine and talk therapy. Treatments involving electrical stimulation of the brain can be used in situations with extremely severe symptoms or when medicine and talk therapy do not work over time. These treatments include electroconvulsive therapy, transcranial magnetic stimulation, and vagal nerve stimulation.   This information is not intended to replace advice given to you by your health care provider. Make sure you discuss any questions you have with your health care provider.   Document Released: 06/20/2012 Document Revised: 03/16/2014 Document Reviewed: 06/20/2012 Elsevier Interactive Patient Education 2016 Elsevier Inc.   Chronic Back Pain  When back pain lasts longer than 3 months, it is called chronic back pain.People with chronic back pain often go through certain periods that are more intense (flare-ups).  CAUSES Chronic back pain can be caused by wear and tear (degeneration) on different structures in your back. These structures include:  The bones of your spine (vertebrae) and the joints surrounding your spinal cord and nerve roots (facets).  The strong, fibrous tissues that connect your vertebrae (ligaments). Degeneration of these structures may result in pressure on your nerves. This can lead to constant pain. HOME CARE  INSTRUCTIONS  Avoid bending, heavy lifting, prolonged sitting, and activities which make the problem worse.  Take brief periods of rest throughout the day to reduce your pain. Lying down or standing usually is better than sitting while you are resting.  Take over-the-counter or prescription medicines only as directed by your caregiver. SEEK IMMEDIATE MEDICAL CARE IF:   You have weakness or numbness in one of your legs or feet.  You have trouble controlling your bladder or bowels.  You have nausea, vomiting, abdominal pain, shortness of breath, or fainting.   This information is not intended to replace advice given to you by your health care provider. Make sure you discuss any questions you have with your health care provider.   Document Released: 04/02/2004 Document Revised: 05/18/2011 Document Reviewed: 08/13/2014 Elsevier Interactive Patient Education [employer]

## 2015-08-21 NOTE — Progress Notes (Signed)
       William Vincent is a 50 y.o. male who [facility] Medicine today for   1) Chronic Back pain: Patient has developed chronic back pain since February 2017. He was injured at work. The extensive workup revealed bilateral pars defects. He is failed trial of conservative measures and is unable to return to work. He uses Norco 10 mg 40 tablets per month and amitriptyline. This reasonably controls his pain. He denies any worsening or new pain. He feels stable. He notes that he has essentially been dismissed from his job and has lost his health insurance.  2) depression: Patient notes worsening irritability and mood. This is been worsening since he has been unable to work. He notes little interest and pleasure in doing things feeling down and depressed and hopeless and feeling tired and having little energy. Additionally he notes anxiety symptoms such as feeling nervous and anxious or on edge and difficulty with worrying.   Past Medical History  Diagnosis Date  . Gout   . Kidney stones   . Hypertension    Past Surgical History  Procedure Laterality Date  . Tonsillectomy and adenoidectomy     Social History  Substance Use Topics  . Smoking status: Never Smoker   . Smokeless tobacco: Not on file  . Alcohol Use: No   family history is not on file.  ROS as above:  Medications: Current Outpatient Prescriptions  Medication Sig Dispense Refill  . amitriptyline (ELAVIL) 25 MG tablet Take 1 tablet (25 mg total) by mouth at bedtime as needed (pain). 30 tablet 1  . HYDROcodone-acetaminophen (NORCO) 10-325 MG tablet Take 1 tablet by mouth every 8 (eight) hours as needed. 40 tablet 0  . omeprazole (PRILOSEC) 40 MG capsule Take 1 capsule (40 mg total) by mouth daily. 30 capsule 3  . citalopram (CELEXA) 20 MG tablet Take 1 tablet (20 mg total) by mouth daily. 30 tablet 0  .  HYDROcodone-acetaminophen (NORCO) 10-325 MG tablet Take 1 tablet by mouth every 8 (eight) hours as needed. Fill in 30 days 40 tablet 0  . HYDROcodone-acetaminophen (NORCO) 10-325 MG tablet Take 1 tablet by mouth every 8 (eight) hours as needed. Fill in 60 days 40 tablet 0  . ibuprofen (ADVIL,MOTRIN) 800 MG tablet Take 1 tablet (800 mg total) by mouth every 8 (eight) hours as needed. 90 tablet 3   No current facility-administered medications for this visit.   Allergies  Allergen Reactions  . Flexeril [Cyclobenzaprine] Hives and Rash     Exam:  BP 130/88 mmHg  Pulse 85  Wt 244 lb (110.678 kg) Gen: Well NAD HEENT: EOMI,  MMM  Exts: Brisk capillary refill, warm and well perfused.  Back nontender normal motion normal gait. Pain with extension. Psych: Alert and oriented normal speech thought process and affect. PHQ9 is 18 GAD 7 is 19  No results found for this or any previous visit (from the past 24 hour(s)). No results found.    Assessment and Plan: 50 y.o. male with  1) chronic back pain: Stable. Continue Norco. Three-month supply written. 2) anxiety and depression: Start Celexa. Recheck in 2-4 weeks.    Discussed warning signs or symptoms. Please see discharge instructions. Patient expresses understanding.

## 2015-09-17 ENCOUNTER — Encounter: Payer: Self-pay | Admitting: Family Medicine

## 2015-09-17 ENCOUNTER — Ambulatory Visit (INDEPENDENT_AMBULATORY_CARE_PROVIDER_SITE_OTHER): Payer: Self-pay | Admitting: Family Medicine

## 2015-09-17 VITALS — BP 165/112 | HR 88 | Wt 242.0 lb

## 2015-09-17 DIAGNOSIS — M549 Dorsalgia, unspecified: Secondary | ICD-10-CM

## 2015-09-17 DIAGNOSIS — M4306 Spondylolysis, lumbar region: Secondary | ICD-10-CM

## 2015-09-17 DIAGNOSIS — G8929 Other chronic pain: Secondary | ICD-10-CM

## 2015-09-17 DIAGNOSIS — R030 Elevated blood-pressure reading, without diagnosis of hypertension: Secondary | ICD-10-CM

## 2015-09-17 DIAGNOSIS — IMO0001 Reserved for inherently not codable concepts without codable children: Secondary | ICD-10-CM

## 2015-09-17 MED ORDER — METHOCARBAMOL 500 MG PO TABS: 500.0000 mg | ORAL_TABLET | Freq: Three times a day (TID) | ORAL | Status: DC

## 2015-09-17 NOTE — Patient Instructions (Signed)
Thank you for coming in today. Apply for [facility]ity.  Return in 1-3 months.

## 2015-09-17 NOTE — Progress Notes (Addendum)
Gershon CraneDannie Lafayette DragonCarr is a 50 y.o. male who presents [name] today for follow-up back pain.  He notes the back pain is persistent. He is unable to work because of persistent low back pain sometimes radiating to his bilateral calves. He notes that he is unable to afford physical therapy or the hydrocodone pain medication. The pain is debilitating. No new weakness or numbness.   Past Medical History  Diagnosis Date  . Gout   . Kidney stones   . Hypertension    Past Surgical History  Procedure Laterality Date  . Tonsillectomy and adenoidectomy     Social History  Substance Use Topics  . Smoking status: Never Smoker   . Smokeless tobacco: Not on file  . Alcohol Use: No   family history is not on file.  ROS as above:  Medications: Current Outpatient Prescriptions  Medication Sig Dispense Refill  . amitriptyline (ELAVIL) 25 MG tablet Take 1 tablet (25 mg total) by mouth at bedtime as needed (pain). 30 tablet 1  . citalopram (CELEXA) 20 MG tablet Take 1 tablet (20 mg total) by mouth daily. 30 tablet 0  . HYDROcodone-acetaminophen (NORCO) 10-325 MG tablet Take 1 tablet by mouth every 8 (eight) hours as needed. 40 tablet 0  . HYDROcodone-acetaminophen (NORCO) 10-325 MG tablet Take 1 tablet by mouth every 8 (eight) hours as needed. Fill in 30 days 40 tablet 0  . HYDROcodone-acetaminophen (NORCO) 10-325 MG tablet Take 1 tablet by mouth every 8 (eight) hours as needed. Fill in 60 days 40 tablet 0  . ibuprofen (ADVIL,MOTRIN) 800 MG tablet Take 1 tablet (800 mg total) by mouth every 8 (eight) hours as needed. 90 tablet 3  . methocarbamol (ROBAXIN) 500 MG tablet Take 1 tablet (500 mg total) by mouth 3 (three) times daily. 90 tablet 12  . omeprazole (PRILOSEC) 40 MG capsule Take 1 capsule (40 mg total) by mouth daily. 30 capsule 3   No current facility-administered  medications for this visit.   Allergies  Allergen Reactions  . Flexeril [Cyclobenzaprine] Hives and Rash     Exam:  BP 165/112 mmHg  Pulse 88  Wt 242 lb (109.77 kg) Gen: Well NAD Obese HEENT: EOMI,  MMM Lungs: Normal work of breathing. CTABL Heart: RRR no MRG Abd: NABS, Soft. Nondistended, Nontender Exts: Brisk capillary refill, warm and well perfused.  Back: Nontender to midline. Tender bilateral lumbar paraspinals. Pain with extension normal flexion. Antalgic gait using a cane present.  No results found for this or any previous visit (from the past 24 hour(s)). No results found.    Assessment and Plan: 50 y.o. male with   Chronic low back pain. I'm not optimistic about the patient making significant recovery. I would like to try physical therapy again as core strengthening can help his chronic back pain that is likely due to his pars defects on both sides. Certainly I think he should be applying for permanent disability. Additionally I recommend he apply for [place] charity program as he'll be able to get his medicines and doctor visits covered for free essentially.  Elevated blood pressure. This is the first time he had significantly elevated blood pressure. I think it's probably due to pain and ibuprofen. Recheck in one month.  Discussed warning signs or symptoms. Please see discharge instructions. Patient expresses understanding.     Summary of previous diagnosis and treatment:   Mr. Lafayette DragonCarr was able to work symptom-free his  entire life until February 2017 when he suffered significant pain while at work lifting a heavy object.  He was seen in the emergency department on the sixth and in my office on February 7. As part of the workup he had extensive imaging of his entire spinal cord from his lumbar to his thoracic to his cervical spine as well as imaging of his hip and knees. The only significant finding was a pars defect in his lumbar spine. As he failed to improve  with usual conservative measures he was referred to neurosurgery who did not see a surgical issue to fix and to rheumatology who did not see any rheumatologic condition to treat.   Since the initial injury in February Mr. Blahut has been in a significant amount of pain but has been compliant with treatment. He has attended physical therapy which has not helped. He has attended consultations with other doctors which have not helped and even undergone a significantly painful procedure (nerve conduction study) to evaluate the cause of his pain.   All the imaging tests aside from the lumbar spine showing a pars defect have been unremarkable and not shown a clear cause of his pain.  However as is often the case central back pain often does not have a clearly identifiable cause on imaging studies.  I think that the pain is due to his pars defect in the lumbar spine. This pars defect will allow the positioning of the vertebrae to move more more than it should with normal back motion. This will often cause significant back pain and often pinch lumbar nerve roots causing pain radiating down the legs. Because the problem is with motion, often static imaging tests and studies like MRIs and nerve conduction study simply will not show these issues well.    To my knowledge there is not a good imaging or diagnostic tests to evaluate objectively Mr. Pienta pain.   I think that it's more likely than not within a reasonable degree of medical certainty that the events of February 6 caused or significantly contributed to, or exacerbated the symptoms that I am currently treating him for.  I believe the pain is almost certainly caused by an injury to, or an exacerbation of a previously asymptomatic pre-existing condition located in his lumbar spine. He is currently disabled from the performance of his preinjury job duties. I do not anticipate Mr. Rademaker returning to work.

## 2015-10-07 ENCOUNTER — Telehealth: Payer: Self-pay

## 2015-10-07 NOTE — Telephone Encounter (Signed)
William Vincent called stating that she found out that danny has been denied long term disability benefits based on office notes and MRI results. Advised William that Dannielle Huh has the right appeal this decision and to contact us if needed. William verbalized understanding.

## 2015-10-15 ENCOUNTER — Ambulatory Visit (INDEPENDENT_AMBULATORY_CARE_PROVIDER_SITE_OTHER): Payer: Self-pay | Admitting: Family Medicine

## 2015-10-15 ENCOUNTER — Encounter: Payer: Self-pay | Admitting: Family Medicine

## 2015-10-15 VITALS — BP 141/95 | HR 86 | Wt 242.0 lb

## 2015-10-15 DIAGNOSIS — F321 Major depressive disorder, single episode, moderate: Secondary | ICD-10-CM

## 2015-10-15 DIAGNOSIS — M4306 Spondylolysis, lumbar region: Secondary | ICD-10-CM

## 2015-10-15 DIAGNOSIS — M5416 Radiculopathy, lumbar region: Secondary | ICD-10-CM

## 2015-10-15 DIAGNOSIS — G8929 Other chronic pain: Secondary | ICD-10-CM

## 2015-10-15 DIAGNOSIS — M549 Dorsalgia, unspecified: Secondary | ICD-10-CM

## 2015-10-15 DIAGNOSIS — M25551 Pain in right hip: Secondary | ICD-10-CM

## 2015-10-15 MED ORDER — OMEPRAZOLE 40 MG PO CPDR: 40.0000 mg | DELAYED_RELEASE_CAPSULE | Freq: Every day | ORAL | 3 refills | Status: DC

## 2015-10-15 MED ORDER — HYDROCODONE-ACETAMINOPHEN 10-325 MG PO TABS: 1.0000 | ORAL_TABLET | Freq: Three times a day (TID) | ORAL | 0 refills | Status: DC | PRN

## 2015-10-15 MED ORDER — CITALOPRAM HYDROBROMIDE 20 MG PO TABS: 20.0000 mg | ORAL_TABLET | Freq: Every day | ORAL | 2 refills | Status: DC

## 2015-10-15 MED ORDER — METHOCARBAMOL 500 MG PO TABS: 500.0000 mg | ORAL_TABLET | Freq: Three times a day (TID) | ORAL | 12 refills | Status: DC
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
## 2015-10-15 NOTE — Patient Instructions (Signed)
Thank you for coming in today. Return in 1 month.  We will try to get you back into PT.

## 2015-11-15 ENCOUNTER — Ambulatory Visit: Payer: Self-pay | Admitting: Family Medicine

## 2015-11-15 ENCOUNTER — Ambulatory Visit (INDEPENDENT_AMBULATORY_CARE_PROVIDER_SITE_OTHER): Payer: Self-pay | Admitting: Family Medicine

## 2015-11-15 VITALS — BP 147/103 | HR 75 | Wt 249.0 lb

## 2015-11-15 DIAGNOSIS — M65342 Trigger finger, left ring finger: Secondary | ICD-10-CM

## 2015-11-15 DIAGNOSIS — I10 Essential (primary) hypertension: Secondary | ICD-10-CM

## 2015-11-15 DIAGNOSIS — M549 Dorsalgia, unspecified: Secondary | ICD-10-CM

## 2015-11-15 DIAGNOSIS — K047 Periapical abscess without sinus: Secondary | ICD-10-CM | POA: Insufficient documentation

## 2015-11-15 DIAGNOSIS — G8929 Other chronic pain: Secondary | ICD-10-CM

## 2015-11-15 MED ORDER — AMLODIPINE BESYLATE 10 MG PO TABS: 10.0000 mg | ORAL_TABLET | Freq: Every day | ORAL | 6 refills | Status: DC

## 2015-11-15 MED ORDER — METHOCARBAMOL 500 MG PO TABS: 500.0000 mg | ORAL_TABLET | Freq: Three times a day (TID) | ORAL | 12 refills | Status: DC

## 2015-11-15 MED ORDER — GABAPENTIN 300 MG PO CAPS
300.0000 mg | ORAL_CAPSULE | Freq: Three times a day (TID) | ORAL | 3 refills | Status: DC
Start: 2015-11-15 — End: 2016-02-03

## 2015-11-15 MED ORDER — PENICILLIN V POTASSIUM 500 MG PO TABS: 500.0000 mg | ORAL_TABLET | Freq: Four times a day (QID) | ORAL | 1 refills | Status: DC

## 2015-11-15 NOTE — Progress Notes (Signed)
William Vincent is a 50 y.o. male who presents to [facility]: [facility] today for multiple medical problems.  Patient notes new pain in his right hand. Over the past few weeks he's had triggering of the right fourth PIP. He feels pain and popping in the right fourth palmar MCP. His finger gets stuck down and requires forceful extension results in a painful pop. He denies any radiating pain weakness or numbness.  Hypertension: Patient has had elevated blood pressures recently. No chest pains palpitations shortness of breath.  Chronic back pain: Patient notes his continued chronic back pain. He requests refills of gabapentin and Robaxin which work a little.  New tooth pain: Patient notes new left lower tooth pain. He notes he has poor dentition and multiple broken teeth and cavities. He's had pain and swelling of the last few days consistent with prior episodes of dental infection. He requests penicillin prescription which has worked well in the past.   Past Medical History:  Diagnosis Date  . Gout   . Hypertension   . Kidney stones    Past Surgical History:  Procedure Laterality Date  . TONSILLECTOMY AND ADENOIDECTOMY     Social History  Substance Use Topics  . Smoking status: Never Smoker  . Smokeless tobacco: Not on file  . Alcohol use No   family history is not on file.  ROS as above:  Medications: Current Outpatient Prescriptions  Medication Sig Dispense Refill  . amitriptyline (ELAVIL) 25 MG tablet Take 1 tablet (25 mg total) by mouth at bedtime as needed (pain). 30 tablet 1  . citalopram (CELEXA) 20 MG tablet Take 1 tablet (20 mg total) by mouth daily. 30 tablet 2  . HYDROcodone-acetaminophen (NORCO) 10-325 MG tablet Take 1 tablet by mouth every 8 (eight) hours as needed. 40 tablet 0  . HYDROcodone-acetaminophen (NORCO) 10-325 MG tablet Take 1 tablet by mouth every  8 (eight) hours as needed. Fill in 30 days 40 tablet 0  . HYDROcodone-acetaminophen (NORCO) 10-325 MG tablet Take 1 tablet by mouth every 8 (eight) hours as needed. Fill in 60 days 40 tablet 0  . ibuprofen (ADVIL,MOTRIN) 800 MG tablet Take 1 tablet (800 mg total) by mouth every 8 (eight) hours as needed. 90 tablet 3  . methocarbamol (ROBAXIN) 500 MG tablet Take 1 tablet (500 mg total) by mouth 3 (three) times daily. 90 tablet 12  . omeprazole (PRILOSEC) 40 MG capsule Take 1 capsule (40 mg total) by mouth daily. 30 capsule 3  . amLODipine (NORVASC) 10 MG tablet Take 1 tablet (10 mg total) by mouth daily. 30 tablet 6  . gabapentin (NEURONTIN) 300 MG capsule Take 1 capsule (300 mg total) by mouth 3 (three) times daily. One tab PO qHS for a week, then BID for a week, then TID. May double weekly to a max of 3,600mg /day 90 capsule 3  . penicillin v potassium (VEETID) 500 MG tablet Take 1 tablet (500 mg total) by mouth 4 (four) times daily. 120 tablet 1   No current facility-administered medications for this visit.    Allergies  Allergen Reactions  . Flexeril [Cyclobenzaprine] Hives and Rash     Exam:  BP (!) 147/103   Pulse 75   Wt 249 lb (112.9 kg)  Gen: Well NAD HEENT: EOMI,  MMM Poor dentition throughout with gum erythema and tenderness with broken teeth left lower.  No visible abscess. Jaw is minimally tender with no palpable fluctuance or  induration. Lungs: Normal work of breathing. CTABL Heart: RRR no MRG Abd: NABS, Soft. Nondistended, Nontender Exts: Brisk capillary refill, warm and well perfused.  Neck: Nontender to midline. Decreased motion. Diffusely tender to lateral lumbar paraspinal muscles. Antalgic gait present. Right hand: Triggering visible at fourth agent. Nontender. Pulses Refill and sensation and strength are intact  No results found for this or any previous visit (from the past 24 hour(s)). No results found.    Assessment and Plan: 50 y.o. male with  1)  hypertension: Not well controlled. Start amlodipine recheck in one month 2) trigger finger: Discussed options. Plan for double Band-Aid splint. Recheck in 1 month's 3) dental infection: Start penicillin. Recommend [facility]. 4) chronic pain: Continue medications listed above.   No orders of the defined types were placed in this encounter.   Discussed warning signs or symptoms. Please see discharge instructions. Patient expresses understanding.

## 2015-11-15 NOTE — Patient Instructions (Signed)
Thank you for coming in today. Continue current medications. Start amlodipine daily for blood pressure. Use splint for trigger finger. Take penicillin for dental infection.  Return in 1 month.  Trigger Finger Trigger finger (digital tendinitis and stenosing tenosynovitis) is a common disorder that causes an often painful catching of the fingers or thumb. It occurs as a clicking, snapping, or locking of a finger in the palm of the hand. This is caused by a problem with the tendons that flex or bend the fingers sliding smoothly through their sheaths. The condition may occur in any finger or a couple fingers at the same time.  The finger may lock with the finger curled or suddenly straighten out with a snap. This is more common in patients with rheumatoid arthritis and diabetes. Left untreated, the condition may get worse to the point where the finger becomes locked in flexion, like making a fist, or less commonly locked with the finger straightened out. CAUSES   Inflammation and scarring that lead to swelling around the tendon sheath.  Repeated or forceful movements.  Rheumatoid arthritis, an autoimmune disease that affects joints.  Gout.  Diabetes mellitus. SIGNS AND SYMPTOMS  Soreness and swelling of your finger.  A painful clicking or snapping as you bend and straighten your finger. DIAGNOSIS  Your health care provider will do a physical exam of your finger to diagnose trigger finger. TREATMENT   Splinting for 6-8 weeks may be helpful.  Nonsteroidal anti-inflammatory medicines (NSAIDs) can help to relieve the pain and inflammation.  Cortisone injections, along with splinting, may speed up recovery. Several injections may be required. Cortisone may give relief after one injection.  Surgery is another treatment that may be used if conservative treatments do not work. Surgery can be minor, without incisions (a cut does not have to be made), and can be done with a needle through the  skin.  Other surgical choices involve an open procedure in which the surgeon opens the hand through a small incision and cuts the pulley so the tendon can again slide smoothly. Your hand will still work fine. HOME CARE INSTRUCTIONS  Apply ice to the injured area, twice per day:  Put ice in a plastic bag.  Place a towel between your skin and the bag.  Leave the ice on for 20 minutes, 3-4 times a day.  Rest your hand often. MAKE SURE YOU:   Understand these instructions.  Will watch your condition.  Will get help right away if you are not doing well or get worse.   This information is not intended to replace advice given to you by your health care provider. Make sure you discuss any questions you have with your health care provider.   Document Released: 12/14/2003 Document Revised: 10/26/2012 Document Reviewed: 07/26/2012 Elsevier Interactive Patient Education [employer]2016 Elsevier Inc.

## 2015-12-13 ENCOUNTER — Ambulatory Visit: Payer: Self-pay | Admitting: Family Medicine

## 2015-12-16 ENCOUNTER — Ambulatory Visit (INDEPENDENT_AMBULATORY_CARE_PROVIDER_SITE_OTHER): Payer: Self-pay | Admitting: Family Medicine

## 2015-12-16 ENCOUNTER — Encounter: Payer: Self-pay | Admitting: Family Medicine

## 2015-12-16 VITALS — BP 155/101 | HR 105 | Ht 70.5 in | Wt 252.0 lb

## 2015-12-16 DIAGNOSIS — M5416 Radiculopathy, lumbar region: Secondary | ICD-10-CM

## 2015-12-16 DIAGNOSIS — G8929 Other chronic pain: Secondary | ICD-10-CM

## 2015-12-16 DIAGNOSIS — M25551 Pain in right hip: Secondary | ICD-10-CM

## 2015-12-16 DIAGNOSIS — M5441 Lumbago with sciatica, right side: Secondary | ICD-10-CM

## 2015-12-16 DIAGNOSIS — M5442 Lumbago with sciatica, left side: Secondary | ICD-10-CM

## 2015-12-16 DIAGNOSIS — I10 Essential (primary) hypertension: Secondary | ICD-10-CM

## 2015-12-16 MED ORDER — HYDROCODONE-ACETAMINOPHEN 10-325 MG PO TABS: 1.0000 | ORAL_TABLET | Freq: Three times a day (TID) | ORAL | 0 refills | Status: DC | PRN

## 2015-12-16 MED ORDER — LISINOPRIL-HYDROCHLOROTHIAZIDE 10-12.5 MG PO TABS: 1.0000 | ORAL_TABLET | Freq: Every day | ORAL | 3 refills | Status: DC

## 2015-12-16 MED ORDER — AMLODIPINE BESYLATE 10 MG PO TABS: 10.0000 mg | ORAL_TABLET | Freq: Every day | ORAL | 6 refills | Status: DC

## 2015-12-16 MED ORDER — IBUPROFEN 800 MG PO TABS: 800.0000 mg | ORAL_TABLET | Freq: Three times a day (TID) | ORAL | 3 refills | Status: DC | PRN

## 2015-12-16 NOTE — Progress Notes (Signed)
William Vincent is a 50 y.o. male who presents to [name]: Primary Care [facility] Medicine today for follow-up pain and hypertension.  Back pain: Patient has chronic low back pain radiating to both legs. This is been well evaluated in the past. He currently takes the below medications which helped mildly to control his pain. Pain can be severe at times. No new weakness or numbness.  Hypertension: Patient takes amlodipine 10 mg daily. No chest pain palpitations or shortness of breath.   Past Medical History:  Diagnosis Date  . Gout   . Hypertension   . Kidney stones    Past Surgical History:  Procedure Laterality Date  . TONSILLECTOMY AND ADENOIDECTOMY     Social History  Substance Use Topics  . Smoking status: Never Smoker  . Smokeless tobacco: Not on file  . Alcohol use No   family history is not on file.  ROS as above:  Medications: Current Outpatient Prescriptions  Medication Sig Dispense Refill  . amitriptyline (ELAVIL) 25 MG tablet Take 1 tablet (25 mg total) by mouth at bedtime as needed (pain). 30 tablet 1  . amLODipine (NORVASC) 10 MG tablet Take 1 tablet (10 mg total) by mouth daily. 30 tablet 6  . citalopram (CELEXA) 20 MG tablet Take 1 tablet (20 mg total) by mouth daily. 30 tablet 2  . gabapentin (NEURONTIN) 300 MG capsule Take 1 capsule (300 mg total) by mouth 3 (three) times daily. One tab PO qHS for a week, then BID for a week, then TID. May double weekly to a max of 3,600mg /day 90 capsule 3  . HYDROcodone-acetaminophen (NORCO) 10-325 MG tablet Take 1 tablet by mouth every 8 (eight) hours as needed. 40 tablet 0  . HYDROcodone-acetaminophen (NORCO) 10-325 MG tablet Take 1 tablet by mouth every 8 (eight) hours as needed. Fill in 30 days 40 tablet 0  . HYDROcodone-acetaminophen (NORCO) 10-325 MG tablet Take 1 tablet by mouth every 8 (eight) hours as needed. Fill in 60 days 40  tablet 0  . ibuprofen (ADVIL,MOTRIN) 800 MG tablet Take 1 tablet (800 mg total) by mouth every 8 (eight) hours as needed. 90 tablet 3  . methocarbamol (ROBAXIN) 500 MG tablet Take 1 tablet (500 mg total) by mouth 3 (three) times daily. 90 tablet 12  . omeprazole (PRILOSEC) 40 MG capsule Take 1 capsule (40 mg total) by mouth daily. 30 capsule 3  . penicillin v potassium (VEETID) 500 MG tablet Take 1 tablet (500 mg total) by mouth 4 (four) times daily. 120 tablet 1  . lisinopril-hydrochlorothiazide (PRINZIDE,ZESTORETIC) 10-12.5 MG tablet Take 1 tablet by mouth daily. 30 tablet 3   No current facility-administered medications for this visit.    Allergies  Allergen Reactions  . Flexeril [Cyclobenzaprine] Hives and Rash     Exam:  BP (!) 155/101   Pulse (!) 105   Ht 5' 10.5" (1.791 m)   Wt 252 lb (114.3 kg)   BMI 35.65 kg/m  Gen: Well NAD HEENT: EOMI,  MMM Lungs: Normal work of breathing. CTABL Heart: Heart rate normal per my check at 86 bpm no MRG Abd: NABS, Soft. Nondistended, Nontender Exts: Brisk capillary refill, warm and well perfused.  Back: Antalgic gait. Lower extremity strength is equal and normal throughout.  No results found for this or any previous visit (from the past 24 hour(s)). No results found.    Assessment and Plan: 50 y.o. male with  Back pain: Chronic: Refill current medications. Continue  current management.  Hypertension worsening due to weight gain likely. Pain may be a contributor here. Previously had a has tried to avoid using medications that require laboratory management due to a desire to avoid increasing healthcare cost as he does not have health insurance. However at this point plan to use hydrochlorothiazide and lisinopril and recheck labs in 1-3 months.   No orders of the defined types were placed in this encounter.   Discussed warning signs or symptoms. Please see discharge instructions. Patient expresses understanding.

## 2015-12-16 NOTE — Patient Instructions (Signed)
Thank you for coming in today. Start New blood pressure medicine.  Recheck blood pressure and labs in 1-3 months.  Call or go to the emergency room if you get worse, have trouble breathing, have chest pains, or palpitations.   Hydrochlorothiazide, HCTZ; Lisinopril tablets What is this medicine? HYDROCHLOROTHIAZIDE; LISINOPRIL (hye droe klor oh THYE a zide; lyse IN oh pril) is a combination of a diuretic and an ACE inhibitor. It is used to treat high blood pressure. This medicine may be used for other purposes; ask your health care provider or pharmacist if you have questions. What should I tell my health care provider before I take this medicine? They need to know if you have any of these conditions: -bone marrow disease -decreased urine -heart or blood vessel disease -if you are on a special diet like a low salt diet -immune system problems, like lupus -kidney disease -liver disease -previous swelling of the tongue, face, or lips with difficulty breathing, difficulty swallowing, hoarseness, or tightening of the throat -recent heart attack or stroke -an unusual or allergic reaction to lisinopril, hydrochlorothiazide, sulfa drugs, other medicines, insect venom, foods, dyes, or preservatives -pregnant or trying to get pregnant -breast-feeding How should I use this medicine? Take this medicine by mouth with a glass of water. Follow the directions on the prescription label. You can take it with or without food. If it upsets your stomach, take it with food. Take your medicine at regular intervals. Do not take it more often than directed. Do not stop taking except on your doctor's advice. Talk to your pediatrician regarding the use of this medicine in children. Special care may be needed. Overdosage: If you think you have taken too much of this medicine contact a poison control center or emergency room at once. NOTE: This medicine is only for you. Do not share this medicine with others. What if  I miss a dose? If you miss a dose, take it as soon as you can. If it is almost time for your next dose, take only that dose. Do not take double or extra doses. What may interact with this medicine? -barbiturates like phenobarbital -blood pressure medicines -corticosteroids like prednisone -diabetic medications -diuretics, especially triamterene, spironolactone or amiloride -lithium -NSAIDs like ibuprofen -potassium salts or potassium supplements -prescription pain medicines -skeletal muscle relaxants like tubocurarine -some cholesterol lowering medications like cholestyramine or colestipol This list may not describe all possible interactions. Give your health care provider a list of all the medicines, herbs, non-prescription drugs, or dietary supplements you use. Also tell them if you smoke, drink alcohol, or use illegal drugs. Some items may interact with your medicine. What should I watch for while using this medicine? Visit your doctor or health care professional for regular checks on your progress. Check your blood pressure as directed. Ask your doctor or health care professional what your blood pressure should be and when you should contact him or her. Call your doctor or health care professional if you notice an irregular or fast heart beat. You must not get dehydrated. Ask your doctor or health care professional how much fluid you need to drink a day. Check with him or her if you get an attack of severe diarrhea, nausea and vomiting, or if you sweat a lot. The loss of too much body fluid can make it dangerous for you to take this medicine. Women should inform their doctor if they wish to become pregnant or think they might be pregnant. There is a potential for  serious side effects to an unborn child. Talk to your health care professional or pharmacist for more information. You may get drowsy or dizzy. Do not drive, use machinery, or do anything that needs mental alertness until you know how  this drug affects you. Do not stand or sit up quickly, especially if you are an older patient. This reduces the risk of dizzy or fainting spells. Alcohol can make you more drowsy and dizzy. Avoid alcoholic drinks. This medicine may affect your blood sugar level. If you have diabetes, check with your doctor or health care professional before changing the dose of your diabetic medicine. Avoid salt substitutes unless you are told otherwise by your doctor or health care professional. This medicine can make you more sensitive to the sun. Keep out of the sun. If you cannot avoid being in the sun, wear protective clothing and use sunscreen. Do not use sun lamps or tanning beds/booths. Do not treat yourself for coughs, colds, or pain while you are taking this medicine without asking your doctor or health care professional for advice. Some ingredients may increase your blood pressure. What side effects may I notice from receiving this medicine? Side effects that you should report to your doctor or health care professional as soon as possible: -changes in vision -confusion, dizziness, light headedness or fainting spells -decreased amount of urine passed -difficulty breathing or swallowing, hoarseness, or tightening of the throat -eye pain -fast or irregular heart beat, palpitations, or chest pain -muscle cramps -nausea and vomiting -persistent dry cough -redness, blistering, peeling or loosening of the skin, including inside the mouth -stomach pain -swelling of your face, lips, tongue, hands, or feet -unusual rash, bleeding or bruising, or pinpoint red spots on the skin -worsened gout pain -yellowing of the eyes or skin Side effects that usually do not require medical attention (report to your doctor or health care professional if they continue or are bothersome): -change in sex drive or performance -cough -headache This list may not describe all possible side effects. Call your doctor for medical  advice about side effects. You may report side effects to FDA at 1-800-FDA-1088. Where should I keep my medicine? Keep out of the reach of children. Store at room temperature between 20 and 25 degrees C (68 and 77 degrees F). Protect from moisture and excessive light. Keep container tightly closed. Throw away any unused medicine after the expiration date. NOTE: This sheet is a summary. It may not cover all possible information. If you have questions about this medicine, talk to your doctor, pharmacist, or health care provider.    2016, Elsevier/Gold Standard. (2009-11-13 13:33:52)

## 2016-01-22 ENCOUNTER — Encounter: Payer: Self-pay | Admitting: Family Medicine

## 2016-01-29 ENCOUNTER — Ambulatory Visit: Payer: Self-pay | Admitting: Family Medicine

## 2016-02-03 ENCOUNTER — Encounter: Payer: Self-pay | Admitting: Family Medicine

## 2016-02-03 ENCOUNTER — Ambulatory Visit (INDEPENDENT_AMBULATORY_CARE_PROVIDER_SITE_OTHER): Payer: Self-pay | Admitting: Family Medicine

## 2016-02-03 VITALS — BP 155/80 | HR 99 | Wt 257.0 lb

## 2016-02-03 DIAGNOSIS — M5442 Lumbago with sciatica, left side: Secondary | ICD-10-CM

## 2016-02-03 DIAGNOSIS — M5441 Lumbago with sciatica, right side: Secondary | ICD-10-CM

## 2016-02-03 DIAGNOSIS — G8929 Other chronic pain: Secondary | ICD-10-CM

## 2016-02-03 DIAGNOSIS — I10 Essential (primary) hypertension: Secondary | ICD-10-CM

## 2016-02-03 MED ORDER — HYDROCODONE-ACETAMINOPHEN 10-325 MG PO TABS: 1.0000 | ORAL_TABLET | Freq: Three times a day (TID) | ORAL | 0 refills | Status: DC | PRN

## 2016-02-03 MED ORDER — GABAPENTIN 300 MG PO CAPS: 600.0000 mg | ORAL_CAPSULE | Freq: Three times a day (TID) | ORAL | 3 refills | Status: DC

## 2016-02-03 MED ORDER — LISINOPRIL-HYDROCHLOROTHIAZIDE 20-25 MG PO TABS: 1.0000 | ORAL_TABLET | Freq: Every day | ORAL | 2 refills | Status: DC

## 2016-02-03 NOTE — Patient Instructions (Signed)
Thank you for coming in today. Increase blood pressure medicine to next dose.  Get labs.  Increase gabapentin to 2 pills three times daily.  Recheck in 2 months.

## 2016-02-04 NOTE — Progress Notes (Signed)
William Vincent is a 50 y.o. male who [name] today for hypertension, back pain, lumbar radiculopathy.  Hypertension: Doing well with lisinopril/hydrochlorothiazide and amlodipine. No chest pains palpitations shortness of breath. No lightheadedness or dizziness.  Back pain: Patient continues to experience lumbar pain. He does have some radiating pain which is discussed below. Pain is worse with standing. He denies any weakness or numbness. He continues to use hydrocodone for pain as needed.  Lumbar radiculopathy: Patient continues to express pain radiating to legs bilaterally. He uses gabapentin 300 mg up to 3 times daily which does not help to control his pain sufficiently.   Past Medical History:  Diagnosis Date  . Gout   . Hypertension   . Kidney stones    Past Surgical History:  Procedure Laterality Date  . TONSILLECTOMY AND ADENOIDECTOMY     Social History  Substance Use Topics  . Smoking status: Never Smoker  . Smokeless tobacco: Not on file  . Alcohol use No   family history is not on file.  ROS as above:  Medications: Current Outpatient Prescriptions  Medication Sig Dispense Refill  . amitriptyline (ELAVIL) 25 MG tablet Take 1 tablet (25 mg total) by mouth at bedtime as needed (pain). 30 tablet 1  . amLODipine (NORVASC) 10 MG tablet Take 1 tablet (10 mg total) by mouth daily. 30 tablet 6  . citalopram (CELEXA) 20 MG tablet Take 1 tablet (20 mg total) by mouth daily. 30 tablet 2  . gabapentin (NEURONTIN) 300 MG capsule Take 2 capsules (600 mg total) by mouth 3 (three) times daily. 180 capsule 3  . HYDROcodone-acetaminophen (NORCO) 10-325 MG tablet Take 1 tablet by mouth every 8 (eight) hours as needed. 40 tablet 0  . HYDROcodone-acetaminophen (NORCO) 10-325 MG tablet Take 1 tablet by mouth every 8 (eight) hours as needed. Fill in 30 days  40 tablet 0  . HYDROcodone-acetaminophen (NORCO) 10-325 MG tablet Take 1 tablet by mouth every 8 (eight) hours as needed. Fill in 60 days 40 tablet 0  . ibuprofen (ADVIL,MOTRIN) 800 MG tablet Take 1 tablet (800 mg total) by mouth every 8 (eight) hours as needed. 90 tablet 3  . methocarbamol (ROBAXIN) 500 MG tablet Take 1 tablet (500 mg total) by mouth 3 (three) times daily. 90 tablet 12  . omeprazole (PRILOSEC) 40 MG capsule Take 1 capsule (40 mg total) by mouth daily. 30 capsule 3  . penicillin v potassium (VEETID) 500 MG tablet Take 1 tablet (500 mg total) by mouth 4 (four) times daily. 120 tablet 1  . lisinopril-hydrochlorothiazide (PRINZIDE,ZESTORETIC) 20-25 MG tablet Take 1 tablet by mouth daily. 30 tablet 2   No current facility-administered medications for this visit.    Allergies  Allergen Reactions  . Flexeril [Cyclobenzaprine] Hives and Rash    Health Maintenance Health Maintenance  Topic Date Due  . INFLUENZA VACCINE  04/15/2016 (Originally 10/08/2015)  . TETANUS/TDAP  04/15/2016 (Originally 08/25/1986)  . HIV Screening  Completed     Exam:  BP (!) 155/80   Pulse 99   Wt 257 lb (116.6 kg)   BMI 36.35 kg/m  Gen: Well NAD HEENT: EOMI,  MMM Lungs: Normal work of breathing. CTABL Heart: RRR no MRG Abd: NABS, Soft. Nondistended, Nontender Exts: Brisk capillary refill, warm and well perfused.  Back: Nontender midline. Normal motion pain with extension. Sensation intact distal bilateral lower extremities   No results found for this or any previous  visit (from the past 72 hour(s)). No results found.    Assessment and Plan: 50 y.o. male with  Hypertension: Not well controlled. Increase lisinopril/hydrochlorothiazide and continue amlodipine. Check metabolic panel. Recheck in 2 months.  Chronic back pain: Continue hydrocodone. Recheck in 2 months.  Lumbar radiculopathy: Not well controlled. Increase gabapentin to 600 mg 3 times daily.   Orders Placed This Encounter    Procedures  . BASIC METABOLIC PANEL WITH GFR    Discussed warning signs or symptoms. Please see discharge instructions. Patient expresses understanding.

## 2016-02-07 ENCOUNTER — Telehealth: Payer: Self-pay

## 2016-02-07 NOTE — Telephone Encounter (Signed)
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   Phone - [phone]

## 2016-02-11 ENCOUNTER — Telehealth: Payer: Self-pay

## 2016-02-11 NOTE — Telephone Encounter (Signed)
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 Denyse Amassorey. I asked Dr. Dareen PianoAnderson what the allotted time frame was for making the report and he advised " [employer] typically allows two weeks to make the report. I have 2 phone calls over 2 business days to reach the provider". He states that their is no need to return his call as the report has already been submitted. He will call back [facility] and when [facility] addend report with the information from Dr. Denyse Amassorey.

## 2016-02-11 NOTE — Telephone Encounter (Signed)
Dr. Dareen PianoAnderson left a VM on 02/10/2016 requesting a call back at [phone] to discuss concerns related to the pts workers comp or insurance.

## 2016-02-15 ENCOUNTER — Other Ambulatory Visit: Payer: Self-pay | Admitting: Family Medicine

## 2016-02-17 ENCOUNTER — Telehealth: Payer: Self-pay | Admitting: Family Medicine

## 2016-02-17 MED ORDER — CITALOPRAM HYDROBROMIDE 20 MG PO TABS: 20.0000 mg | ORAL_TABLET | Freq: Every day | ORAL | 2 refills | Status: DC

## 2016-02-17 NOTE — Telephone Encounter (Signed)
Pt called and stated he is in need of a refill for his celexa. The pharmacy told him to call his dr. Lynford Humphreyhanks

## 2016-03-17 ENCOUNTER — Ambulatory Visit: Payer: Self-pay | Admitting: Family Medicine

## 2016-03-19 ENCOUNTER — Ambulatory Visit: Payer: Self-pay | Admitting: Family Medicine

## 2016-03-20 ENCOUNTER — Encounter: Payer: Self-pay | Admitting: Family Medicine

## 2016-03-20 ENCOUNTER — Ambulatory Visit (INDEPENDENT_AMBULATORY_CARE_PROVIDER_SITE_OTHER): Payer: Self-pay | Admitting: Family Medicine

## 2016-03-20 VITALS — BP 110/80 | Wt 257.0 lb

## 2016-03-20 DIAGNOSIS — M5442 Lumbago with sciatica, left side: Secondary | ICD-10-CM

## 2016-03-20 DIAGNOSIS — M5441 Lumbago with sciatica, right side: Secondary | ICD-10-CM

## 2016-03-20 DIAGNOSIS — I10 Essential (primary) hypertension: Secondary | ICD-10-CM

## 2016-03-20 DIAGNOSIS — M5416 Radiculopathy, lumbar region: Secondary | ICD-10-CM

## 2016-03-20 DIAGNOSIS — G8929 Other chronic pain: Secondary | ICD-10-CM

## 2016-03-20 DIAGNOSIS — M25551 Pain in right hip: Secondary | ICD-10-CM

## 2016-03-20 DIAGNOSIS — F321 Major depressive disorder, single episode, moderate: Secondary | ICD-10-CM

## 2016-03-20 DIAGNOSIS — M4306 Spondylolysis, lumbar region: Secondary | ICD-10-CM

## 2016-03-20 MED ORDER — HYDROCODONE-ACETAMINOPHEN 10-325 MG PO TABS: 1.0000 | ORAL_TABLET | Freq: Three times a day (TID) | ORAL | 0 refills | Status: DC | PRN

## 2016-03-20 MED ORDER — IBUPROFEN 800 MG PO TABS: 800.0000 mg | ORAL_TABLET | Freq: Three times a day (TID) | ORAL | 11 refills | Status: DC | PRN

## 2016-03-20 MED ORDER — HYDROCHLOROTHIAZIDE 25 MG PO TABS: 25.0000 mg | ORAL_TABLET | Freq: Every day | ORAL | 11 refills | Status: DC

## 2016-03-20 MED ORDER — CITALOPRAM HYDROBROMIDE 20 MG PO TABS: 20.0000 mg | ORAL_TABLET | Freq: Every day | ORAL | 11 refills | Status: DC

## 2016-03-20 MED ORDER — LISINOPRIL 20 MG PO TABS: 20.0000 mg | ORAL_TABLET | Freq: Every day | ORAL | 11 refills | Status: DC

## 2016-03-20 MED ORDER — AMLODIPINE BESYLATE 10 MG PO TABS: 10.0000 mg | ORAL_TABLET | Freq: Every day | ORAL | 11 refills | Status: DC

## 2016-03-20 MED ORDER — GABAPENTIN 300 MG PO CAPS: 600.0000 mg | ORAL_CAPSULE | Freq: Three times a day (TID) | ORAL | 11 refills | Status: DC

## 2016-03-20 NOTE — Patient Instructions (Signed)
Thank you for coming in today. Continue current medicines.  Recheck in 3 months.   Call or go to the emergency room if you get worse, have trouble breathing, have chest pains, or palpitations.

## 2016-03-20 NOTE — Progress Notes (Signed)
William Vincent is a 51 y.o. male who [name] Primary Care Sports Medicine today for hypertension, radiculopathy, chronic pain, depression.  Hypertension: Doing well with the below medications. No chest pain palpitations shortness of breath.  Lumbar radiculopathy: Still painful associated with chronic back pain. Patient seems to be reasonably well controlled with gabapentin, ibuprofen and hydrocodone. He is still unable to work due to chronic pain.  Depression: Patient is doing reasonably well with Celexa. He denies any SI or HI.   Past Medical History:  Diagnosis Date  . Gout   . Hypertension   . Kidney stones    Past Surgical History:  Procedure Laterality Date  . TONSILLECTOMY AND ADENOIDECTOMY     Social History  Substance Use Topics  . Smoking status: Never Smoker  . Smokeless tobacco: Not on file  . Alcohol use No   family history is not on file.  ROS as above:  Medications: Current Outpatient Prescriptions  Medication Sig Dispense Refill  . amitriptyline (ELAVIL) 25 MG tablet Take 1 tablet (25 mg total) by mouth at bedtime as needed (pain). 30 tablet 1  . amLODipine (NORVASC) 10 MG tablet Take 1 tablet (10 mg total) by mouth daily. 30 tablet 11  . citalopram (CELEXA) 20 MG tablet Take 1 tablet (20 mg total) by mouth daily. 30 tablet 11  . gabapentin (NEURONTIN) 300 MG capsule Take 2 capsules (600 mg total) by mouth 3 (three) times daily. 180 capsule 11  . hydrochlorothiazide (HYDRODIURIL) 25 MG tablet Take 1 tablet (25 mg total) by mouth daily. 30 tablet 11  . HYDROcodone-acetaminophen (NORCO) 10-325 MG tablet Take 1 tablet by mouth every 8 (eight) hours as needed. 40 tablet 0  . HYDROcodone-acetaminophen (NORCO) 10-325 MG tablet Take 1 tablet by mouth every 8 (eight) hours as needed. 40 tablet 0  . HYDROcodone-acetaminophen (NORCO) 10-325 MG tablet Take 1 tablet  by mouth every 8 (eight) hours as needed. 40 tablet 0  . ibuprofen (ADVIL,MOTRIN) 800 MG tablet Take 1 tablet (800 mg total) by mouth every 8 (eight) hours as needed. 90 tablet 11  . lisinopril (PRINIVIL,ZESTRIL) 20 MG tablet Take 1 tablet (20 mg total) by mouth daily. 30 tablet 11   No current facility-administered medications for this visit.    Allergies  Allergen Reactions  . Flexeril [Cyclobenzaprine] Hives and Rash    Health Maintenance Health Maintenance  Topic Date Due  . INFLUENZA VACCINE  04/15/2016 (Originally 10/08/2015)  . TETANUS/TDAP  04/15/2016 (Originally 08/25/1986)  . HIV Screening  Completed     Exam:  BP 110/80   Wt 257 lb (116.6 kg)   BMI 36.35 kg/m  Gen: Well NAD HEENT: EOMI,  MMM Lungs: Normal work of breathing. CTABL Heart: RRR no MRG Abd: NABS, Soft. Nondistended, Nontender Exts: Brisk capillary refill, warm and well perfused.  Back: Nontender to spinal midline. Tender palpation bilateral lumbar paraspinals. Patient ambulatory cane. Pain is worse with extension Psych: Alert and oriented normal speech separate process and affect.   No results found for this or any previous visit (from the past 72 hour(s)). No results found.    Assessment and Plan: 51 y.o. male with  Hypertension: At goal. Continue current regimen.  Chronic pain: Doing reasonably well. Pain contract signed today. Recheck in 3 months. Refill Norco.  Depression: Doing reasonably well with Celexa. Continue current regimen.   No orders of the defined types were placed in this encounter.   Discussed warning  signs or symptoms. Please see discharge instructions. Patient expresses understanding.

## 2016-04-23 DIAGNOSIS — Z0271 Encounter for disability determination: Secondary | ICD-10-CM

## 2016-05-12 ENCOUNTER — Other Ambulatory Visit: Payer: Self-pay

## 2016-05-12 ENCOUNTER — Other Ambulatory Visit: Payer: Self-pay | Admitting: Family Medicine

## 2016-05-12 MED ORDER — LISINOPRIL 20 MG PO TABS: 20.0000 mg | ORAL_TABLET | Freq: Every day | ORAL | 11 refills | Status: DC

## 2016-05-12 MED ORDER — CITALOPRAM HYDROBROMIDE 20 MG PO TABS: 20.0000 mg | ORAL_TABLET | Freq: Every day | ORAL | 11 refills | Status: DC

## 2016-05-28 ENCOUNTER — Telehealth: Payer: Self-pay | Admitting: Family Medicine

## 2016-05-28 ENCOUNTER — Other Ambulatory Visit: Payer: Self-pay | Admitting: Family Medicine

## 2016-05-28 MED ORDER — PENICILLIN V POTASSIUM 500 MG PO TABS: 500.0000 mg | ORAL_TABLET | Freq: Four times a day (QID) | ORAL | 1 refills | Status: DC

## 2016-05-28 NOTE — Telephone Encounter (Signed)
Pt called and needs refill on his penicillin.

## 2016-05-28 NOTE — Telephone Encounter (Signed)
rx refilled per verbal from Clementeen GrahamEvan Corey, MD.

## 2016-06-18 ENCOUNTER — Encounter: Payer: Self-pay | Admitting: Family Medicine

## 2016-06-18 ENCOUNTER — Ambulatory Visit (INDEPENDENT_AMBULATORY_CARE_PROVIDER_SITE_OTHER): Payer: Self-pay | Admitting: Family Medicine

## 2016-06-18 VITALS — BP 122/87 | HR 90 | Wt 259.0 lb

## 2016-06-18 DIAGNOSIS — M25551 Pain in right hip: Secondary | ICD-10-CM

## 2016-06-18 DIAGNOSIS — I10 Essential (primary) hypertension: Secondary | ICD-10-CM

## 2016-06-18 DIAGNOSIS — G8929 Other chronic pain: Secondary | ICD-10-CM

## 2016-06-18 DIAGNOSIS — M5416 Radiculopathy, lumbar region: Secondary | ICD-10-CM

## 2016-06-18 DIAGNOSIS — F321 Major depressive disorder, single episode, moderate: Secondary | ICD-10-CM

## 2016-06-18 DIAGNOSIS — M5442 Lumbago with sciatica, left side: Secondary | ICD-10-CM

## 2016-06-18 DIAGNOSIS — M5441 Lumbago with sciatica, right side: Secondary | ICD-10-CM

## 2016-06-18 MED ORDER — AMLODIPINE BESYLATE 10 MG PO TABS: 10.0000 mg | ORAL_TABLET | Freq: Every day | ORAL | 11 refills | Status: DC

## 2016-06-18 MED ORDER — GABAPENTIN 300 MG PO CAPS: 600.0000 mg | ORAL_CAPSULE | Freq: Three times a day (TID) | ORAL | 11 refills | Status: DC

## 2016-06-18 MED ORDER — HYDROCHLOROTHIAZIDE 25 MG PO TABS: 25.0000 mg | ORAL_TABLET | Freq: Every day | ORAL | 11 refills | Status: DC

## 2016-06-18 MED ORDER — AMITRIPTYLINE HCL 25 MG PO TABS: 25.0000 mg | ORAL_TABLET | Freq: Every evening | ORAL | 6 refills | Status: DC | PRN

## 2016-06-18 MED ORDER — LISINOPRIL 20 MG PO TABS: 20.0000 mg | ORAL_TABLET | Freq: Every day | ORAL | 11 refills | Status: DC

## 2016-06-18 MED ORDER — HYDROCODONE-ACETAMINOPHEN 10-325 MG PO TABS: 1.0000 | ORAL_TABLET | Freq: Three times a day (TID) | ORAL | 0 refills | Status: DC | PRN

## 2016-06-18 MED ORDER — VENLAFAXINE HCL ER 75 MG PO CP24: 75.0000 mg | ORAL_CAPSULE | Freq: Every day | ORAL | 5 refills | Status: DC

## 2016-06-18 MED ORDER — MELOXICAM 15 MG PO TABS: 15.0000 mg | ORAL_TABLET | Freq: Every day | ORAL | 6 refills | Status: DC

## 2016-06-18 NOTE — Progress Notes (Signed)
William Vincent is a 52 y.o. male who presents to [name]: Primary Care Sports Medicine today for follow up HTN, chronic pain, and depression.  HTN: Doing well with HCTZ, lisinopril and Amlodipine. He denies chest pain palpitations or shortness of breath.  Chronic pain: Patient has chronic pain with some radicular symptoms. This is being managed with amitriptyline Norco and gabapentin. He would like to stop gabapentin have possible in the near future. He continues to have chronic daily pain but is able to manage with the below management. He is now working due to chronic pain.  Depression: Patient is worsening depression symptoms. He does not think that Celexa is very effective and will like to switch possible. He denies any SI or HI.   Past Medical History:  Diagnosis Date  . Gout   . Hypertension   . Kidney stones    Past Surgical History:  Procedure Laterality Date  . TONSILLECTOMY AND ADENOIDECTOMY     Social History  Substance Use Topics  . Smoking status: Never Smoker  . Smokeless tobacco: Never Used  . Alcohol use No   family history is not on file.  ROS as above:  Medications: Current Outpatient Prescriptions  Medication Sig Dispense Refill  . amitriptyline (ELAVIL) 25 MG tablet Take 1 tablet (25 mg total) by mouth at bedtime as needed (pain). 30 tablet 6  . amLODipine (NORVASC) 10 MG tablet Take 1 tablet (10 mg total) by mouth daily. 30 tablet 11  . gabapentin (NEURONTIN) 300 MG capsule Take 2 capsules (600 mg total) by mouth 3 (three) times daily. 180 capsule 11  . hydrochlorothiazide (HYDRODIURIL) 25 MG tablet Take 1 tablet (25 mg total) by mouth daily. 30 tablet 11  . HYDROcodone-acetaminophen (NORCO) 10-325 MG tablet Take 1 tablet by mouth every 8 (eight) hours as needed. 40 tablet 0  . HYDROcodone-acetaminophen (NORCO) 10-325 MG tablet Take 1 tablet by mouth every 8 (eight)  hours as needed. 40 tablet 0  . HYDROcodone-acetaminophen (NORCO) 10-325 MG tablet Take 1 tablet by mouth every 8 (eight) hours as needed. 40 tablet 0  . lisinopril (PRINIVIL,ZESTRIL) 20 MG tablet Take 1 tablet (20 mg total) by mouth daily. 30 tablet 11  . penicillin v potassium (VEETID) 500 MG tablet Take 1 tablet (500 mg total) by mouth 4 (four) times daily. 120 tablet 1  . meloxicam (MOBIC) 15 MG tablet Take 1 tablet (15 mg total) by mouth daily. 30 tablet 6  . venlafaxine XR (EFFEXOR XR) 75 MG 24 hr capsule Take 1 capsule (75 mg total) by mouth daily with breakfast. 30 capsule 5   No current facility-administered medications for this visit.    Allergies  Allergen Reactions  . Flexeril [Cyclobenzaprine] Hives and Rash    Health Maintenance Health Maintenance  Topic Date Due  . TETANUS/TDAP  08/25/1986  . INFLUENZA VACCINE  10/07/2016  . HIV Screening  Completed     Exam:  BP 122/87   Pulse 90   Wt 259 lb (117.5 kg)   BMI 36.64 kg/m  Gen: Well NAD HEENT: EOMI,  MMM Lungs: Normal work of breathing. CTABL Heart: RRR no MRG Abd: NABS, Soft. Nondistended, Nontender Exts: Brisk capillary refill, warm and well perfused.  MSK: Nontender to midline normal back motion flexion strength is intact antalgic gait is present. Psych alert and oriented normal speech thought process and affect.  Depression screen [facility] 2/9 06/18/2016 10/15/2015  Decreased Interest 3 3  Down, Depressed, Hopeless  2 2  PHQ - 2 Score 5 5  Altered sleeping 2 3  Tired, decreased energy 2 3  Change in appetite 2 3  Feeling bad or failure about yourself  2 2  Trouble concentrating 2 2  Moving slowly or fidgety/restless 2 2  Suicidal thoughts 0 0  PHQ-9 Score 17 20  Difficult doing work/chores - Extremely dIfficult      No results found for this or any previous visit (from the past 72 hour(s)). No results found.    Assessment and Plan: 51 y.o. male with  Hypertension doing well continue current  regimen.  Chronic pain continue current regimen. We'll work to wean gabapentin as needed and as tolerated.  Depression: Switch from Celexa to Effexor. Recheck in 3 months.  Patient was researched in the [place] controlled substance reporting system   No orders of the defined types were placed in this encounter.  Meds ordered this encounter  Medications  . venlafaxine XR (EFFEXOR XR) 75 MG 24 hr capsule    Sig: Take 1 capsule (75 mg total) by mouth daily with breakfast.    Dispense:  30 capsule    Refill:  5  . amitriptyline (ELAVIL) 25 MG tablet    Sig: Take 1 tablet (25 mg total) by mouth at bedtime as needed (pain).    Dispense:  30 tablet    Refill:  6  . amLODipine (NORVASC) 10 MG tablet    Sig: Take 1 tablet (10 mg total) by mouth daily.    Dispense:  30 tablet    Refill:  11  . hydrochlorothiazide (HYDRODIURIL) 25 MG tablet    Sig: Take 1 tablet (25 mg total) by mouth daily.    Dispense:  30 tablet    Refill:  11  . HYDROcodone-acetaminophen (NORCO) 10-325 MG tablet    Sig: Take 1 tablet by mouth every 8 (eight) hours as needed.    Dispense:  40 tablet    Refill:  0    Fill today  . HYDROcodone-acetaminophen (NORCO) 10-325 MG tablet    Sig: Take 1 tablet by mouth every 8 (eight) hours as needed.    Dispense:  40 tablet    Refill:  0    Fill in 30 days  . HYDROcodone-acetaminophen (NORCO) 10-325 MG tablet    Sig: Take 1 tablet by mouth every 8 (eight) hours as needed.    Dispense:  40 tablet    Refill:  0    Fill in 60 days  . lisinopril (PRINIVIL,ZESTRIL) 20 MG tablet    Sig: Take 1 tablet (20 mg total) by mouth daily.    Dispense:  30 tablet    Refill:  11  . meloxicam (MOBIC) 15 MG tablet    Sig: Take 1 tablet (15 mg total) by mouth daily.    Dispense:  30 tablet    Refill:  6  . gabapentin (NEURONTIN) 300 MG capsule    Sig: Take 2 capsules (600 mg total) by mouth 3 (three) times daily.    Dispense:  180 capsule    Refill:  11     Discussed  warning signs or symptoms. Please see discharge instructions. Patient expresses understanding.

## 2016-06-18 NOTE — Patient Instructions (Signed)
Thank you for coming in today. Recheck in 3 months.  Return sooner if needed.   STOP celexa.  START effexor 4 days after stopping celexa.   Venlafaxine extended-release capsules What is this medicine? VENLAFAXINE(VEN la fax een) is used to treat depression, anxiety and panic disorder. This medicine may be used for other purposes; ask your health care provider or pharmacist if you have questions. COMMON BRAND NAME(S): Effexor XR What should I tell my health care provider before I take this medicine? They need to know if you have any of these conditions: -bleeding disorders -glaucoma -heart disease -high blood pressure -high cholesterol -kidney disease -liver disease -low levels of sodium in the blood -mania or bipolar disorder -seizures -suicidal thoughts, plans, or attempt; a previous suicide attempt by you or a family -take medicines that treat or prevent blood clots -thyroid disease -an unusual or allergic reaction to venlafaxine, desvenlafaxine, other medicines, foods, dyes, or preservatives -pregnant or trying to get pregnant -breast-feeding How should I use this medicine? Take this medicine by mouth with a full glass of water. Follow the directions on the prescription label. Do not cut, crush, or chew this medicine. Take it with food. If needed, the capsule may be carefully opened and the entire contents sprinkled on a spoonful of cool applesauce. Swallow the applesauce/pellet mixture right away without chewing and follow with a glass of water to ensure complete swallowing of the pellets. Try to take your medicine at about the same time each day. Do not take your medicine more often than directed. Do not stop taking this medicine suddenly except upon the advice of your doctor. Stopping this medicine too quickly may cause serious side effects or your condition may worsen. A special MedGuide will be given to you by the pharmacist with each prescription and refill. Be sure to read  this information carefully each time. Talk to your pediatrician regarding the use of this medicine in children. Special care may be needed. Overdosage: If you think you have taken too much of this medicine contact a poison control center or emergency room at once. NOTE: This medicine is only for you. Do not share this medicine with others. What if I miss a dose? If you miss a dose, take it as soon as you can. If it is almost time for your next dose, take only that dose. Do not take double or extra doses. What may interact with this medicine? Do not take this medicine with any of the following medications: -certain medicines for fungal infections like fluconazole, itraconazole, ketoconazole, posaconazole, voriconazole -cisapride -desvenlafaxine -dofetilide -dronedarone -duloxetine -levomilnacipran -linezolid -MAOIs like Carbex, Eldepryl, Marplan, Nardil, and Parnate -methylene blue (injected into a vein) -milnacipran -pimozide -thioridazine -ziprasidone This medicine may also interact with the following medications: -amphetamines -aspirin and aspirin-like medicines -certain medicines for depression, anxiety, or psychotic disturbances -certain medicines for migraine headaches like almotriptan, eletriptan, frovatriptan, naratriptan, rizatriptan, sumatriptan, zolmitriptan -certain medicines for sleep -certain medicines that treat or prevent blood clots like dalteparin, enoxaparin, warfarin -cimetidine -clozapine -diuretics -fentanyl -furazolidone -indinavir -isoniazid -lithium -metoprolol -NSAIDS, medicines for pain and inflammation, like ibuprofen or naproxen -other medicines that prolong the QT interval (cause an abnormal heart rhythm) -procarbazine -rasagiline -supplements like St. John's wort, kava kava, valerian -tramadol -tryptophan This list may not describe all possible interactions. Give your health care provider a list of all the medicines, herbs, non-prescription  drugs, or dietary supplements you use. Also tell them if you smoke, drink alcohol, or use illegal drugs. Some  items may interact with your medicine. What should I watch for while using this medicine? Tell your doctor if your symptoms do not get better or if they get worse. Visit your doctor or health care professional for regular checks on your progress. Because it may take several weeks to see the full effects of this medicine, it is important to continue your treatment as prescribed by your doctor. Patients and their families should watch out for new or worsening thoughts of suicide or depression. Also watch out for sudden changes in feelings such as feeling anxious, agitated, panicky, irritable, hostile, aggressive, impulsive, severely restless, overly excited and hyperactive, or not being able to sleep. If this happens, especially at the beginning of treatment or after a change in dose, call your health care professional. This medicine can cause an increase in blood pressure. Check with your doctor for instructions on monitoring your blood pressure while taking this medicine. You may get drowsy or dizzy. Do not drive, use machinery, or do anything that needs mental alertness until you know how this medicine affects you. Do not stand or sit up quickly, especially if you are an older patient. This reduces the risk of dizzy or fainting spells. Alcohol may interfere with the effect of this medicine. Avoid alcoholic drinks. Your mouth may get dry. Chewing sugarless gum, sucking hard candy and drinking plenty of water will help. Contact your doctor if the problem does not go away or is severe. What side effects may I notice from receiving this medicine? Side effects that you should report to your doctor or health care professional as soon as possible: -allergic reactions like skin rash, itching or hives, swelling of the face, lips, or tongue -anxious -breathing problems -confusion -changes in  vision -chest pain -confusion -elevated mood, decreased need for sleep, racing thoughts, impulsive behavior -eye pain -fast, irregular heartbeat -feeling faint or lightheaded, falls -feeling agitated, angry, or irritable -hallucination, loss of contact with reality -high blood pressure -loss of balance or coordination -palpitations -redness, blistering, peeling or loosening of the skin, including inside the mouth -restlessness, pacing, inability to keep still -seizures -stiff muscles -suicidal thoughts or other mood changes -trouble passing urine or change in the amount of urine -trouble sleeping -unusual bleeding or bruising -unusually weak or tired -vomiting Side effects that usually do not require medical attention (report to your doctor or health care professional if they continue or are bothersome): -change in sex drive or performance -change in appetite or weight -constipation -dizziness -dry mouth -headache -increased sweating -nausea -tired This list may not describe all possible side effects. Call your doctor for medical advice about side effects. You may report side effects to FDA at 1-800-FDA-1088. Where should I keep my medicine? Keep out of the reach of children. Store at a controlled temperature between 20 and 25 degrees C (68 degrees and 77 degrees F), in a dry place. Throw away any unused medicine after the expiration date. NOTE: This sheet is a summary. It may not cover all possible information. If you have questions about this medicine, talk to your doctor, pharmacist, or health care provider.  2018 Elsevier/Gold Standard (2015-07-25 18:38:02)

## 2016-07-28 ENCOUNTER — Telehealth: Payer: Self-pay | Admitting: Family Medicine

## 2016-07-28 MED ORDER — VENLAFAXINE HCL ER 75 MG PO CP24: 75.0000 mg | ORAL_CAPSULE | Freq: Every day | ORAL | 5 refills | Status: DC

## 2016-07-28 NOTE — Telephone Encounter (Signed)
rx sent to [facility].called pt. No answer.

## 2016-07-28 NOTE — Telephone Encounter (Signed)
Pt called and stated he lost prescription for Effexor and was wanting to know if it can be sent to the pharmacy at [facility] [address] [place]

## 2016-09-17 ENCOUNTER — Ambulatory Visit: Payer: Self-pay | Admitting: Family Medicine

## 2016-09-17 DIAGNOSIS — Z0189 Encounter for other specified special examinations: Secondary | ICD-10-CM

## 2016-09-18 ENCOUNTER — Encounter: Payer: Self-pay | Admitting: Family Medicine

## 2016-09-18 ENCOUNTER — Ambulatory Visit (INDEPENDENT_AMBULATORY_CARE_PROVIDER_SITE_OTHER): Payer: Self-pay | Admitting: Family Medicine

## 2016-09-18 VITALS — BP 113/77 | HR 93 | Wt 264.0 lb

## 2016-09-18 DIAGNOSIS — M5441 Lumbago with sciatica, right side: Secondary | ICD-10-CM

## 2016-09-18 DIAGNOSIS — F325 Major depressive disorder, single episode, in full remission: Secondary | ICD-10-CM

## 2016-09-18 DIAGNOSIS — I10 Essential (primary) hypertension: Secondary | ICD-10-CM

## 2016-09-18 DIAGNOSIS — G8929 Other chronic pain: Secondary | ICD-10-CM

## 2016-09-18 DIAGNOSIS — M25551 Pain in right hip: Secondary | ICD-10-CM

## 2016-09-18 DIAGNOSIS — M5416 Radiculopathy, lumbar region: Secondary | ICD-10-CM

## 2016-09-18 DIAGNOSIS — M5442 Lumbago with sciatica, left side: Secondary | ICD-10-CM

## 2016-09-18 MED ORDER — HYDROCODONE-ACETAMINOPHEN 10-325 MG PO TABS: 1.0000 | ORAL_TABLET | Freq: Three times a day (TID) | ORAL | 0 refills | Status: DC | PRN

## 2016-09-18 NOTE — Progress Notes (Signed)
William Vincent is a 51 y.o. male who [facility] today for follow-up hypertension and chronic pain and depression.  Hypertension: William Vincent takes amlodipine, hydrochlorothiazide and lisinopril daily. He feels well with no chest pain palpitations or shortness of breath.  Chronic pain: Patient continues to experience chronic back and leg pain. He treats this with meloxicam and hydrocodone. These medications help to control his pain and he feels well.  Depression: Patient notes no significant depression symptoms. He longer is taking Celexa. He never did fill the Effexor.   Past Medical History:  Diagnosis Date  . Gout   . Hypertension   . Kidney stones    Past Surgical History:  Procedure Laterality Date  . TONSILLECTOMY AND ADENOIDECTOMY     Social History  Substance Use Topics  . Smoking status: Never Smoker  . Smokeless tobacco: Never Used  . Alcohol use No   family history is not on file.  ROS as above:  Medications: Current Outpatient Prescriptions  Medication Sig Dispense Refill  . amitriptyline (ELAVIL) 25 MG tablet Take 1 tablet (25 mg total) by mouth at bedtime as needed (pain). 30 tablet 6  . amLODipine (NORVASC) 10 MG tablet Take 1 tablet (10 mg total) by mouth daily. 30 tablet 11  . gabapentin (NEURONTIN) 300 MG capsule Take 2 capsules (600 mg total) by mouth 3 (three) times daily. 180 capsule 11  . hydrochlorothiazide (HYDRODIURIL) 25 MG tablet Take 1 tablet (25 mg total) by mouth daily. 30 tablet 11  . HYDROcodone-acetaminophen (NORCO) 10-325 MG tablet Take 1 tablet by mouth every 8 (eight) hours as needed. 40 tablet 0  . HYDROcodone-acetaminophen (NORCO) 10-325 MG tablet Take 1 tablet by mouth every 8 (eight) hours as needed. 40 tablet 0  . HYDROcodone-acetaminophen (NORCO) 10-325 MG tablet Take 1 tablet by mouth every 8 (eight) hours as  needed. 40 tablet 0  . lisinopril (PRINIVIL,ZESTRIL) 20 MG tablet Take 1 tablet (20 mg total) by mouth daily. 30 tablet 11  . meloxicam (MOBIC) 15 MG tablet Take 1 tablet (15 mg total) by mouth daily. 30 tablet 6  . penicillin v potassium (VEETID) 500 MG tablet Take 1 tablet (500 mg total) by mouth 4 (four) times daily. 120 tablet 1   No current facility-administered medications for this visit.    Allergies  Allergen Reactions  . Flexeril [Cyclobenzaprine] Hives and Rash    Health Maintenance Health Maintenance  Topic Date Due  . TETANUS/TDAP  08/25/1986  . INFLUENZA VACCINE  10/07/2016  . HIV Screening  Completed     Exam:  BP 113/77   Pulse 93   Wt 264 lb (119.7 kg)   SpO2 98%   BMI 37.34 kg/m   Wt Readings from Last 10 Encounters:  09/18/16 264 lb (119.7 kg)  06/18/16 259 lb (117.5 kg)  03/20/16 257 lb (116.6 kg)  02/03/16 257 lb (116.6 kg)  12/16/15 252 lb (114.3 kg)  11/15/15 249 lb (112.9 kg)  10/15/15 242 lb (109.8 kg)  09/17/15 242 lb (109.8 kg)  08/21/15 244 lb (110.7 kg)  07/24/15 247 lb (112 kg)    Gen: Well NAD HEENT: EOMI,  MMM Lungs: Normal work of breathing. CTABL Heart: RRR no MRG Abd: NABS, Soft. Nondistended, Nontender Exts: Brisk capillary refill, warm and well perfused.  Back: Nontender to midline decreased back motion. Antalgic gait. Psych: Alert and oriented process and affect.  Depression screen [facility] 2/9 09/18/2016 06/18/2016 10/15/2015  Decreased Interest 0 3 3  Down, Depressed, Hopeless 0 2 2  PHQ - 2 Score 0 5 5  Altered sleeping 1 2 3   Tired, decreased energy 2 2 3   Change in appetite 1 2 3   Feeling bad or failure about yourself  0 2 2  Trouble concentrating 0 2 2  Moving slowly or fidgety/restless 0 2 2  Suicidal thoughts 0 0 0  PHQ-9 Score 4 17 20   Difficult doing work/chores - - Extremely dIfficult   GAD 7 : Generalized Anxiety Score 09/18/2016 10/15/2015  Nervous, Anxious, on Edge 0 2  Control/stop worrying 0 2  Worry too much  - different things 0 2  Trouble relaxing 0 3  Restless 0 3  Easily annoyed or irritable 0 2  Afraid - awful might happen 0 2  Total GAD 7 Score 0 16  Anxiety Difficulty - Extremely difficult       No results found for this or any previous visit (from the past 72 hour(s)). No results found.    Assessment and Plan: 51 y.o. male with  Hypertension doing well continue current regimen.  Chronic pain: Doing well continue current regimen.  Depression: Doing well without medications. We'll continue to follow.  Recheck in 3 months.  Patient researched [facility] Controlled Substance Reporting System.    No orders of the defined types were placed in this encounter.  Meds ordered this encounter  Medications  . HYDROcodone-acetaminophen (NORCO) 10-325 MG tablet    Sig: Take 1 tablet by mouth every 8 (eight) hours as needed.    Dispense:  40 tablet    Refill:  0    Fill today  . HYDROcodone-acetaminophen (NORCO) 10-325 MG tablet    Sig: Take 1 tablet by mouth every 8 (eight) hours as needed.    Dispense:  40 tablet    Refill:  0    Fill in 30 days  . HYDROcodone-acetaminophen (NORCO) 10-325 MG tablet    Sig: Take 1 tablet by mouth every 8 (eight) hours as needed.    Dispense:  40 tablet    Refill:  0    Fill in 60 days     Discussed warning signs or symptoms. Please see discharge instructions. Patient expresses understanding.

## 2016-09-18 NOTE — Patient Instructions (Addendum)
Thank you for coming in today. Continue current medicines.  Let me know if you are taking citalopram (depression medicine).  Recheck in 3 months.  Return sooner if needed.

## 2016-10-01 ENCOUNTER — Other Ambulatory Visit: Payer: Self-pay | Admitting: Family Medicine

## 2016-12-10 ENCOUNTER — Other Ambulatory Visit: Payer: Self-pay | Admitting: Family Medicine

## 2016-12-11 ENCOUNTER — Other Ambulatory Visit: Payer: Self-pay | Admitting: *Deleted

## 2016-12-11 ENCOUNTER — Telehealth: Payer: Self-pay | Admitting: Family Medicine

## 2016-12-11 MED ORDER — MELOXICAM 15 MG PO TABS: 15.0000 mg | ORAL_TABLET | Freq: Every day | ORAL | 0 refills | Status: DC

## 2016-12-11 NOTE — Telephone Encounter (Signed)
Refill sent.

## 2016-12-11 NOTE — Telephone Encounter (Signed)
Pt called and stated he needed a refill on his meloxicam. Thanks

## 2016-12-21 ENCOUNTER — Ambulatory Visit (INDEPENDENT_AMBULATORY_CARE_PROVIDER_SITE_OTHER): Payer: Medicaid Other | Admitting: Family Medicine

## 2016-12-21 ENCOUNTER — Encounter: Payer: Self-pay | Admitting: Family Medicine

## 2016-12-21 VITALS — BP 109/76 | HR 76 | Wt 267.0 lb

## 2016-12-21 DIAGNOSIS — Z23 Encounter for immunization: Secondary | ICD-10-CM

## 2016-12-21 DIAGNOSIS — G8929 Other chronic pain: Secondary | ICD-10-CM

## 2016-12-21 DIAGNOSIS — M5442 Lumbago with sciatica, left side: Secondary | ICD-10-CM

## 2016-12-21 DIAGNOSIS — I10 Essential (primary) hypertension: Secondary | ICD-10-CM | POA: Diagnosis not present

## 2016-12-21 DIAGNOSIS — M5441 Lumbago with sciatica, right side: Secondary | ICD-10-CM

## 2016-12-21 DIAGNOSIS — M25551 Pain in right hip: Secondary | ICD-10-CM | POA: Diagnosis not present

## 2016-12-21 DIAGNOSIS — M5416 Radiculopathy, lumbar region: Secondary | ICD-10-CM | POA: Diagnosis not present

## 2016-12-21 MED ORDER — HYDROCODONE-ACETAMINOPHEN 10-325 MG PO TABS: 1.0000 | ORAL_TABLET | Freq: Three times a day (TID) | ORAL | 0 refills | Status: DC | PRN

## 2016-12-21 MED ORDER — METHOCARBAMOL 500 MG PO TABS: 500.0000 mg | ORAL_TABLET | Freq: Three times a day (TID) | ORAL | 3 refills | Status: DC

## 2016-12-21 MED ORDER — DICLOFENAC SODIUM 1 % TD GEL: 4.0000 g | Freq: Four times a day (QID) | TRANSDERMAL | 11 refills | Status: DC

## 2016-12-21 NOTE — Patient Instructions (Signed)
Thank you for coming in today. Recheck in 3 months.  Return sooner if needed.  Get labs today.

## 2016-12-21 NOTE — Progress Notes (Signed)
William Vincent is a 51 y.o. male who presents to [name]: [facility] Medicine today for follow up HTN, Chronic back pain, finger pain.   HTN: Typically is well-controlled with lisinopril and hydrochlorothiazide. Patient recently obtained Medicaid and now and have lab work done. He denies chest pain palpitations or shortness of breath.  Chronic pain: William Vincent has chronic low back pain. This is been well-managed over the last year or so with hydrocodone and gabapentin and amitriptyline meloxicam. He notes this is working reasonably well to control his pain. He notes the pain control of symptoms function and perform tasks at home.  Hand pain: William Vincent has chronic hand pain bilaterally. He's had a Rheumatologic workup about a year ago that was unremarkable. He uses meloxicam to control the pain which works okay.   Past Medical History:  Diagnosis Date  . Gout   . Hypertension   . Kidney stones    Past Surgical History:  Procedure Laterality Date  . TONSILLECTOMY AND ADENOIDECTOMY     Social History  Substance Use Topics  . Smoking status: Never Smoker  . Smokeless tobacco: Never Used  . Alcohol use No   family history is not on file.  ROS as above:  Medications: Current Outpatient Prescriptions  Medication Sig Dispense Refill  . amitriptyline (ELAVIL) 25 MG tablet Take 1 tablet (25 mg total) by mouth at bedtime as needed (pain). 30 tablet 6  . amLODipine (NORVASC) 10 MG tablet Take 1 tablet (10 mg total) by mouth daily. 30 tablet 11  . gabapentin (NEURONTIN) 300 MG capsule Take 2 capsules (600 mg total) by mouth 3 (three) times daily. 180 capsule 11  . hydrochlorothiazide (HYDRODIURIL) 25 MG tablet Take 1 tablet (25 mg total) by mouth daily. 30 tablet 11  . HYDROcodone-acetaminophen (NORCO) 10-325 MG tablet Take 1 tablet by mouth every 8 (eight) hours as needed. 40 tablet 0  .  HYDROcodone-acetaminophen (NORCO) 10-325 MG tablet Take 1 tablet by mouth every 8 (eight) hours as needed. 40 tablet 0  . HYDROcodone-acetaminophen (NORCO) 10-325 MG tablet Take 1 tablet by mouth every 8 (eight) hours as needed. 40 tablet 0  . lisinopril (PRINIVIL,ZESTRIL) 20 MG tablet Take 1 tablet (20 mg total) by mouth daily. 30 tablet 11  . meloxicam (MOBIC) 15 MG tablet Take 1 tablet (15 mg total) by mouth daily. 30 tablet 0  . penicillin v potassium (VEETID) 500 MG tablet Take 1 tablet (500 mg total) by mouth 4 (four) times daily. 120 tablet 1  . diclofenac sodium (VOLTAREN) 1 % GEL Apply 4 g topically 4 (four) times daily. To affected joint. 100 g 11  . methocarbamol (ROBAXIN) 500 MG tablet Take 1 tablet (500 mg total) by mouth 3 (three) times daily. 270 tablet 3   No current facility-administered medications for this visit.    Allergies  Allergen Reactions  . Flexeril [Cyclobenzaprine] Hives and Rash    Health Maintenance Health Maintenance  Topic Date Due  . TETANUS/TDAP  08/25/1986  . INFLUENZA VACCINE  10/07/2016  . HIV Screening  Completed     Exam:  BP 109/76   Pulse 76   Wt 267 lb (121.1 kg)   BMI 37.77 kg/m  Gen: Well NAD Obese  HEENT: EOMI,  MMM Lungs: Normal work of breathing. CTABL Heart: RRR no MRG Abd: NABS, Soft. Nondistended, Nontender Exts: Brisk capillary refill, warm and well perfused.  Hands bilaterally have minimal synovitis of the MCPs. No ulnar  deviation normal hand motion. L-spine normal flexion and rotation limited extension normal gait.   No results found for this or any previous visit (from the past 72 hour(s)). No results found.    Assessment and Plan: 51 y.o. male with  Hypertension blood pressure goal. Plan to check fasting labs today and continue current regimen.  Chronic back pain doing well on Norco continue current regimen and recheck in 3 months.Patient researched [facility] Controlled Substance Reporting System.  Hand  pain bilaterally unclear etiology. Plan for continued management with NSAIDs recheck in 3 months.   Orders Placed This Encounter  Procedures  . CBC  . COMPLETE METABOLIC PANEL WITH GFR  . Lipid Panel w/reflex Direct LDL   Meds ordered this encounter  Medications  . HYDROcodone-acetaminophen (NORCO) 10-325 MG tablet    Sig: Take 1 tablet by mouth every 8 (eight) hours as needed.    Dispense:  40 tablet    Refill:  0    Fill today  . HYDROcodone-acetaminophen (NORCO) 10-325 MG tablet    Sig: Take 1 tablet by mouth every 8 (eight) hours as needed.    Dispense:  40 tablet    Refill:  0    Fill in 30 days  . HYDROcodone-acetaminophen (NORCO) 10-325 MG tablet    Sig: Take 1 tablet by mouth every 8 (eight) hours as needed.    Dispense:  40 tablet    Refill:  0    Fill in 60 days  . diclofenac sodium (VOLTAREN) 1 % GEL    Sig: Apply 4 g topically 4 (four) times daily. To affected joint.    Dispense:  100 g    Refill:  11  . methocarbamol (ROBAXIN) 500 MG tablet    Sig: Take 1 tablet (500 mg total) by mouth 3 (three) times daily.    Dispense:  270 tablet    Refill:  3     Discussed warning signs or symptoms. Please see discharge instructions. Patient expresses understanding.

## 2017-01-08 ENCOUNTER — Other Ambulatory Visit: Payer: Self-pay | Admitting: Family Medicine

## 2017-01-11 MED FILL — MELOXICAM 15 MG TABLET: 15 | 30 days supply | Qty: 30 | Fill #0

## 2017-01-11 MED FILL — METHOCARBAMOL 500 MG TABS: 500 | 30 days supply | Qty: 90 | Fill #0

## 2017-01-18 IMAGING — MR MR THORACIC SPINE W/O CM
5 of 9 series · 24 of 48 positions shown · non-contrast
Comparison: MRI of the cervical spine 04/22/2015

CLINICAL DATA: Acute onset of bilateral lower extremity
radiculopathy. Pain extending into both lower extremities, worse on
the right.

EXAM:
MRI THORACIC SPINE WITHOUT CONTRAST
TECHNIQUE: Multiplanar, multisequence MR imaging of the thoracic spine was
performed. No intravenous contrast was administered.

[Series 5: T1 · sagittal · 3.0mm · 1.06mm/px · 4 of 9 slices shown (1 of 2)]
[im 1/9]
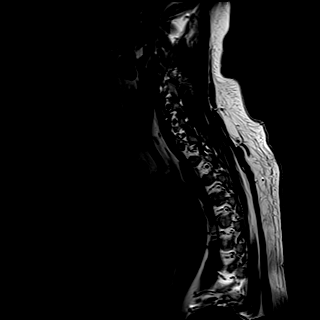
[im 3/9]
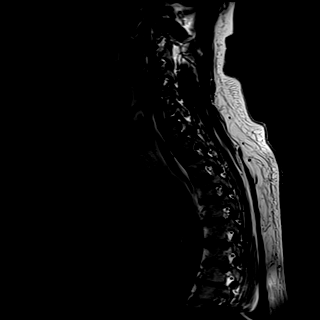
[im 6/9]
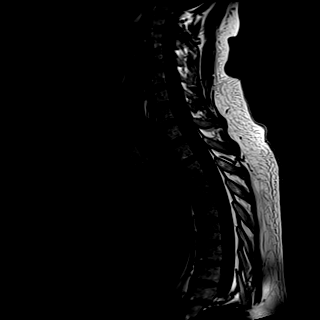
[im 9/9]
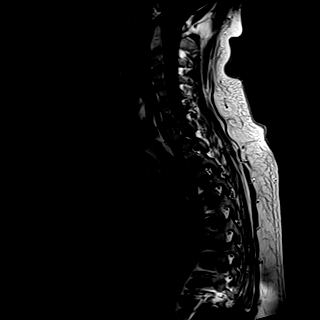

[Series 6: T2 · sagittal · 4.0mm · 1.12mm/px · 6 of 15 slices shown (1 of 3)]
[im 1/15]
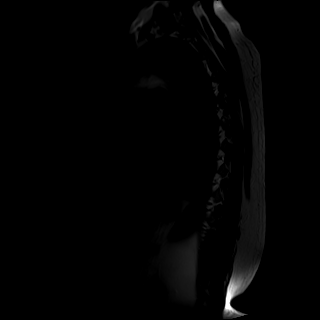
[im 3/15]
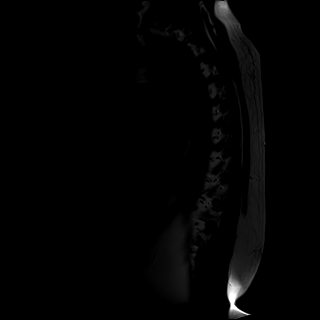
[im 6/15]
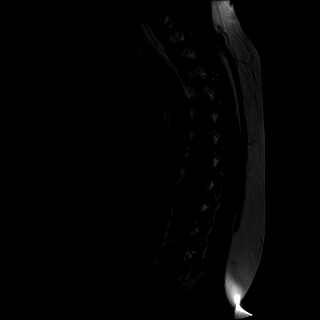
[im 9/15]
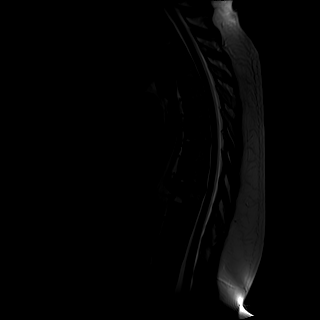
[im 12/15]
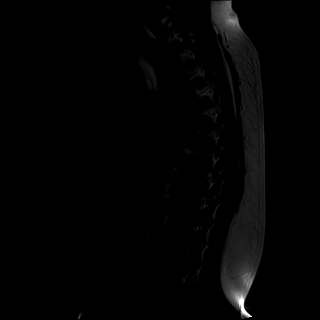
[im 15/15]
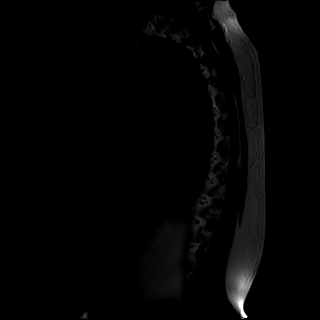

[Series 7: T1 · sagittal · 4.0mm · 0.52mm/px · 2 of 15 slices shown (2 of 2)]
[im 1/15]
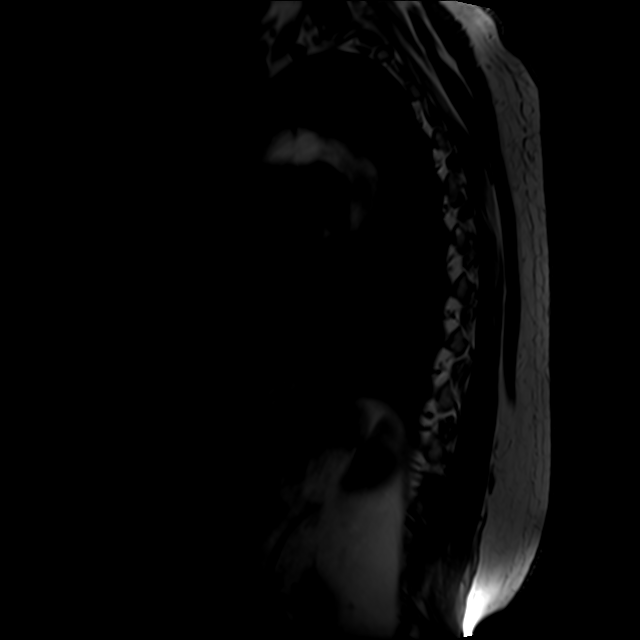
[im 3/15]
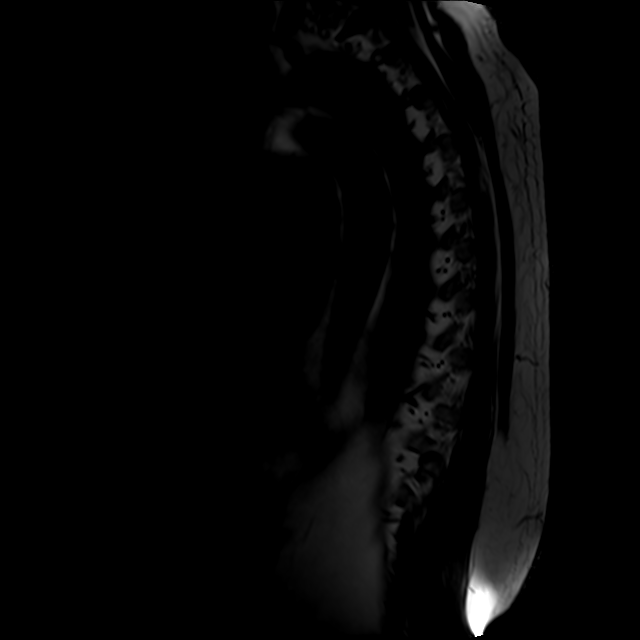

[Series 9: T2 · axial · 4.0mm · 0.41mm/px · z∈[-194,-111]mm · 6 of 18 slices shown (2 of 3)]
[im 1/18]
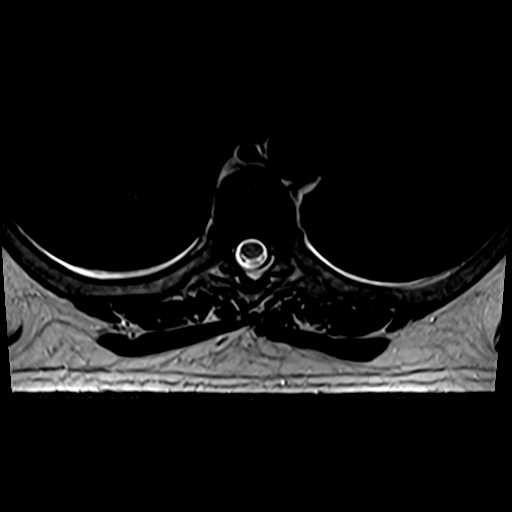
[im 4/18]
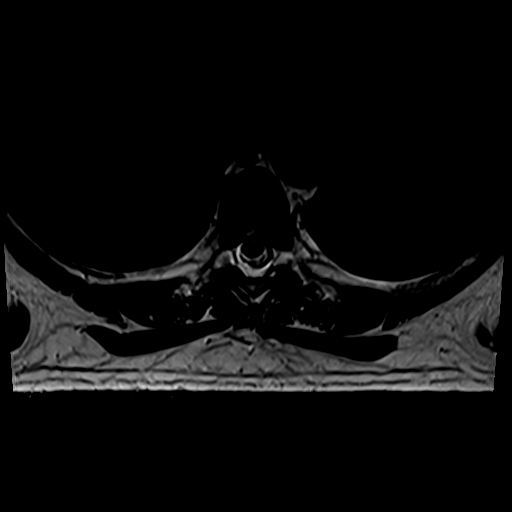
[im 7/18]
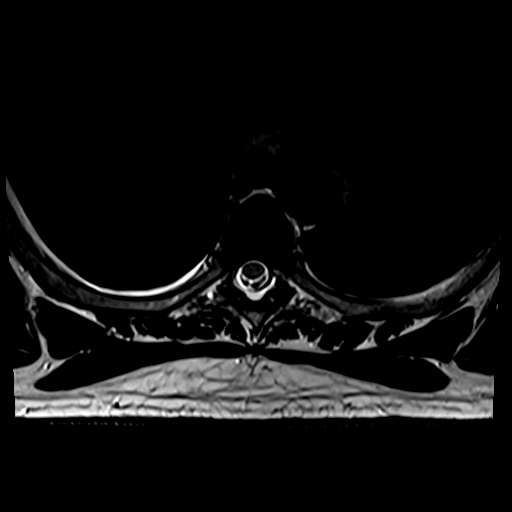
[im 11/18]
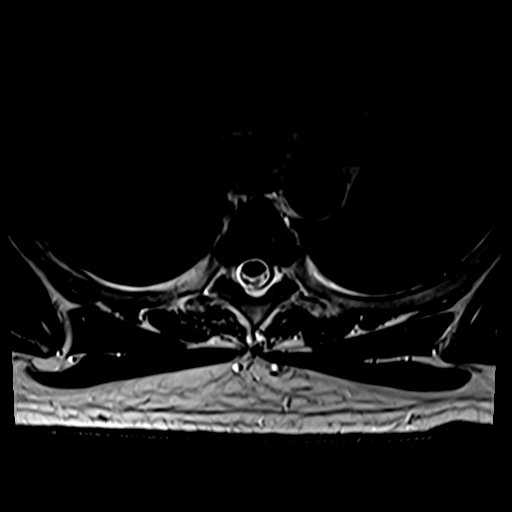
[im 14/18]
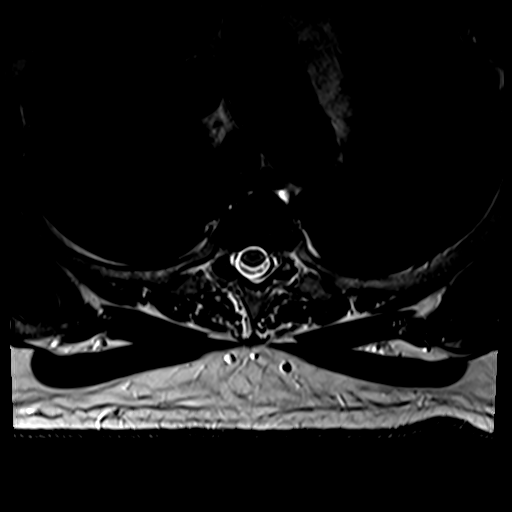
[im 18/18]
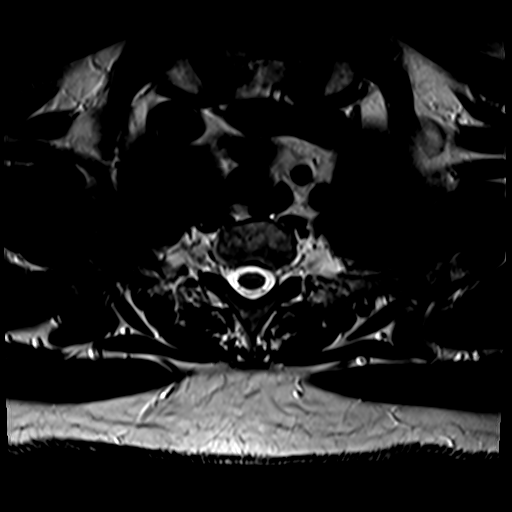

[Series 10: T2 · axial · 4.0mm · 0.41mm/px · z∈[-327,-197]mm · 6 of 18 slices shown (3 of 3)]
[im 1/18]
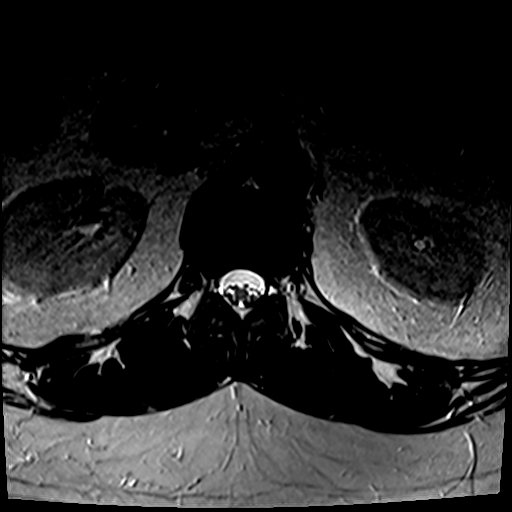
[im 4/18]
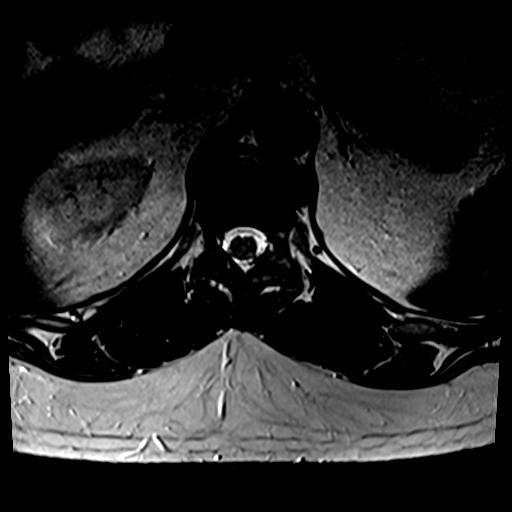
[im 7/18]
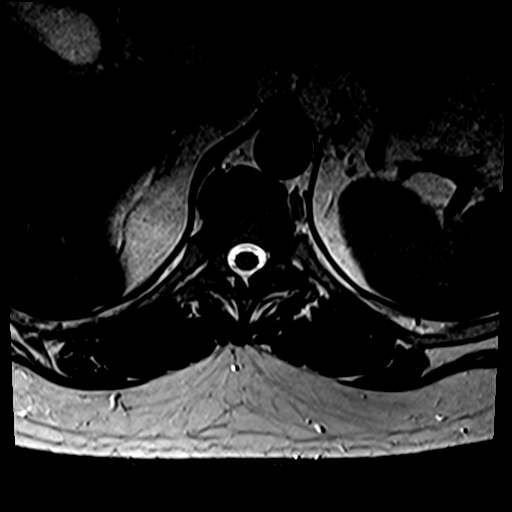
[im 11/18]
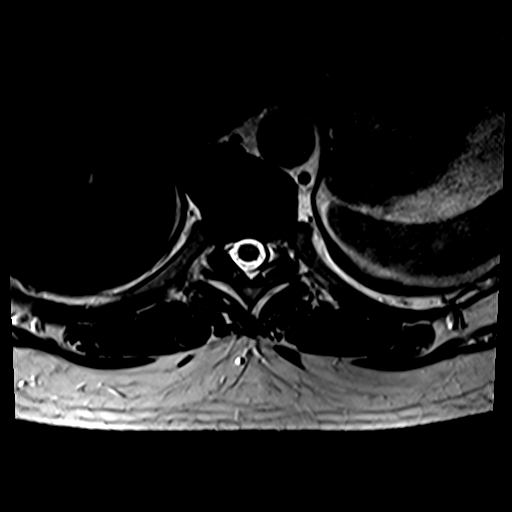
[im 14/18]
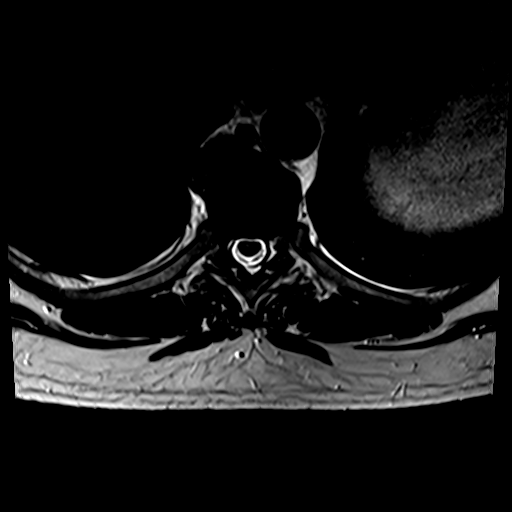
[im 18/18]
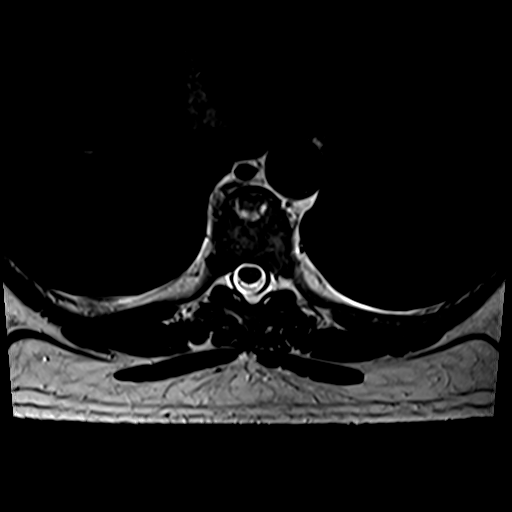

[24 of 48 positions shown; findings below may reference images not displayed]

FINDINGS: Normal signal is present in the spinal cord to the lowest imaged
level, T12-L1. Chronic endplate marrow changes are noted along the
anterior inferior endplates at T6, T8, and T9.

A shallow central disc protrusion is present at T7-8 and at T8-9
with partial effacement of ventral CSF of both levels but no
significant central canal stenosis.

No other focal disc protrusion or central disease is present. The
foramina are patent bilaterally.

The paraspinous soft tissues are normal. The visualized abdominal
organs are within normal limits.
IMPRESSION: 1. Shallow central disc protrusions at T7-8 and T8-9 with partial
effacement of the ventral CSF at both levels but no significant
central canal stenosis.
2. No other significant focal disc protrusion or stenosis.
3. The foramina are patent bilaterally.
4. Minimal inferior endplate marrow change at T6, T8, and T9.

## 2017-02-18 ENCOUNTER — Encounter: Payer: Self-pay | Admitting: Family Medicine

## 2017-02-18 ENCOUNTER — Ambulatory Visit (INDEPENDENT_AMBULATORY_CARE_PROVIDER_SITE_OTHER): Payer: Medicaid Other

## 2017-02-18 ENCOUNTER — Ambulatory Visit: Payer: Medicaid Other | Admitting: Family Medicine

## 2017-02-18 VITALS — BP 110/76 | HR 97 | Ht 70.0 in | Wt 261.0 lb

## 2017-02-18 DIAGNOSIS — M5441 Lumbago with sciatica, right side: Secondary | ICD-10-CM | POA: Diagnosis not present

## 2017-02-18 DIAGNOSIS — F325 Major depressive disorder, single episode, in full remission: Secondary | ICD-10-CM

## 2017-02-18 DIAGNOSIS — Z23 Encounter for immunization: Secondary | ICD-10-CM | POA: Diagnosis not present

## 2017-02-18 DIAGNOSIS — M65342 Trigger finger, left ring finger: Secondary | ICD-10-CM | POA: Diagnosis not present

## 2017-02-18 DIAGNOSIS — M76892 Other specified enthesopathies of left lower limb, excluding foot: Secondary | ICD-10-CM

## 2017-02-18 DIAGNOSIS — M5416 Radiculopathy, lumbar region: Secondary | ICD-10-CM | POA: Diagnosis not present

## 2017-02-18 DIAGNOSIS — G8929 Other chronic pain: Secondary | ICD-10-CM

## 2017-02-18 DIAGNOSIS — M25562 Pain in left knee: Secondary | ICD-10-CM

## 2017-02-18 DIAGNOSIS — I10 Essential (primary) hypertension: Secondary | ICD-10-CM

## 2017-02-18 DIAGNOSIS — M25551 Pain in right hip: Secondary | ICD-10-CM | POA: Diagnosis not present

## 2017-02-18 DIAGNOSIS — M5442 Lumbago with sciatica, left side: Secondary | ICD-10-CM | POA: Diagnosis not present

## 2017-02-18 DIAGNOSIS — M25561 Pain in right knee: Secondary | ICD-10-CM | POA: Insufficient documentation

## 2017-02-18 DIAGNOSIS — M76891 Other specified enthesopathies of right lower limb, excluding foot: Secondary | ICD-10-CM

## 2017-02-18 MED ORDER — DICLOFENAC SODIUM 1 % TD GEL: 4.0000 g | Freq: Four times a day (QID) | TRANSDERMAL | 11 refills | Status: DC

## 2017-02-18 MED ORDER — HYDROCODONE-ACETAMINOPHEN 10-325 MG PO TABS: 1.0000 | ORAL_TABLET | Freq: Three times a day (TID) | ORAL | 0 refills | Status: DC | PRN

## 2017-02-18 MED ORDER — AMITRIPTYLINE HCL 25 MG PO TABS: 25.0000 mg | ORAL_TABLET | Freq: Every evening | ORAL | 1 refills | Status: DC | PRN

## 2017-02-18 MED ORDER — GABAPENTIN 300 MG PO CAPS: 600.0000 mg | ORAL_CAPSULE | Freq: Three times a day (TID) | ORAL | 1 refills | Status: DC

## 2017-02-18 MED ORDER — PROPRANOLOL HCL ER 60 MG PO CP24: 60.0000 mg | ORAL_CAPSULE | Freq: Every day | ORAL | 0 refills | Status: DC

## 2017-02-18 MED ORDER — METHOCARBAMOL 500 MG PO TABS: 500.0000 mg | ORAL_TABLET | Freq: Three times a day (TID) | ORAL | 2 refills | Status: DC

## 2017-02-18 MED FILL — GABAPENTIN 300 MG CAPSULE: 300 | 30 days supply | Qty: 180 | Fill #0

## 2017-02-18 MED FILL — AMITRIPTYLINE HCL 25 MG TAB: 25 | 30 days supply | Qty: 30 | Fill #0

## 2017-02-18 MED FILL — METHOCARBAMOL 500 MG TABS: 500 | 30 days supply | Qty: 90 | Fill #0

## 2017-02-18 MED FILL — PROPRANOLOL HCL ER 60 MG CP: 60 | 30 days supply | Qty: 30 | Fill #0

## 2017-02-18 MED FILL — VOLTAREN 1% GEL: 1 | 6 days supply | Qty: 100 | Fill #0

## 2017-02-18 NOTE — Patient Instructions (Signed)
Thank you for coming in today. Get blood labs and xray today.  Recheck in 1 month.  Start Propranolol daily for hand tremor.  If you get light headed take 1/2 pills of lisinopril.  Continue to work on weight loss.  Let me know if you cannot get the hydrocodone or Voltaren Gel.   Propranolol extended-release capsules What is this medicine? PROPRANOLOL (proe PRAN oh lole) is a beta-blocker. Beta-blockers reduce the workload on the heart and help it to beat more regularly. This medicine is used to treat high blood pressure, heart muscle disease, and prevent chest pain caused by angina. It is also used to prevent migraine headaches. You should not use this medicine to treat a migraine that has already started. This medicine may be used for other purposes; ask your health care provider or pharmacist if you have questions. COMMON BRAND NAME(S): Inderal LA, Inderal XL, InnoPran XL What should I tell my health care provider before I take this medicine? They need to know if you have any of these conditions: -circulation problems, or blood vessel disease -diabetes -history of heart attack or heart disease, vasospastic angina -kidney disease -liver disease -lung or breathing disease, like asthma or emphysema -pheochromocytoma -slow heart rate -thyroid disease -an unusual or allergic reaction to propranolol, other beta-blockers, medicines, foods, dyes, or preservatives -pregnant or trying to get pregnant -breast-feeding How should I use this medicine? Take this medicine by mouth with a glass of water. Follow the directions on the prescription label. Do not crush or chew. Take your doses at regular intervals. Do not take your medicine more often than directed. Do not stop taking except on the advice of your doctor or health care professional. Talk to your pediatrician regarding the use of this medicine in children. Special care may be needed. Overdosage: If you think you have taken too much of this  medicine contact a poison control center or emergency room at once. NOTE: This medicine is only for you. Do not share this medicine with others. What if I miss a dose? If you miss a dose, take it as soon as you can. If it is almost time for your next dose, take only that dose. Do not take double or extra doses. What may interact with this medicine? Do not take this medicine with any of the following medications: -feverfew -phenothiazines like chlorpromazine, mesoridazine, prochlorperazine, thioridazine This medicine may also interact with the following medications: -aluminum hydroxide gel -antipyrine -antiviral medicines for HIV or AIDS -barbiturates like phenobarbital -certain medicines for blood pressure, heart disease, irregular heart beat -cimetidine -ciprofloxacin -diazepam -fluconazole -haloperidol -isoniazid -medicines for cholesterol like cholestyramine or colestipol -medicines for mental depression -medicines for migraine headache like almotriptan, eletriptan, frovatriptan, naratriptan, rizatriptan, sumatriptan, zolmitriptan -NSAIDs, medicines for pain and inflammation, like ibuprofen or naproxen -phenytoin -rifampin -teniposide -theophylline -thyroid medicines -tolbutamide -warfarin -zileuton This list may not describe all possible interactions. Give your health care provider a list of all the medicines, herbs, non-prescription drugs, or dietary supplements you use. Also tell them if you smoke, drink alcohol, or use illegal drugs. Some items may interact with your medicine. What should I watch for while using this medicine? Visit your doctor or health care professional for regular check ups. Contact your doctor right away if your symptoms worsen. Check your blood pressure and pulse rate regularly. Ask your health care professional what your blood pressure and pulse rate should be, and when you should contact them. Do not stop taking this medicine suddenly. This could  lead  to serious heart-related effects. You may get drowsy or dizzy. Do not drive, use machinery, or do anything that needs mental alertness until you know how this drug affects you. Do not stand or sit up quickly, especially if you are an older patient. This reduces the risk of dizzy or fainting spells. Alcohol can make you more drowsy and dizzy. Avoid alcoholic drinks. This medicine can affect blood sugar levels. If you have diabetes, check with your doctor or health care professional before you change your diet or the dose of your diabetic medicine. Do not treat yourself for coughs, colds, or pain while you are taking this medicine without asking your doctor or health care professional for advice. Some ingredients may increase your blood pressure. What side effects may I notice from receiving this medicine? Side effects that you should report to your doctor or health care professional as soon as possible: -allergic reactions like skin rash, itching or hives, swelling of the face, lips, or tongue -breathing problems -changes in blood sugar -cold hands or feet -difficulty sleeping, nightmares -dry peeling skin -hallucinations -muscle cramps or weakness -slow heart rate -swelling of the legs and ankles -vomiting Side effects that usually do not require medical attention (report to your doctor or health care professional if they continue or are bothersome): -change in sex drive or performance -diarrhea -dry sore eyes -hair loss -nausea -weak or tired This list may not describe all possible side effects. Call your doctor for medical advice about side effects. You may report side effects to FDA at 1-800-FDA-1088. Where should I keep my medicine? Keep out of the reach of children. Store at room temperature between 15 and 30 degrees C (59 and 86 degrees F). Protect from light, moisture and freezing. Keep container tightly closed. Throw away any unused medicine after the expiration date. NOTE: This  sheet is a summary. It may not cover all possible information. If you have questions about this medicine, talk to your doctor, pharmacist, or health care provider.  2018 Elsevier/Gold Standard (2012-10-28 14:58:56)  Essential Tremor A tremor is trembling or shaking that you cannot control. Most tremors affect the hands or arms. Tremors can also affect the head, vocal cords, face, and other parts of the body. Essential tremor is a tremor without a known cause. What are the causes? Essential tremor has no known cause. What increases the risk? You may be at greater risk of essential tremor if:  You have a family member with essential tremor.  You are age 49 or older.  You take certain medicines.  What are the signs or symptoms? The main sign of a tremor is uncontrolled and unintentional rhythmic shaking of a body part.  You may have difficulty eating with a spoon or fork.  You may have difficulty writing.  You may nod your head up and down or side to side.  You may have a quivering voice.  Your tremors:  May get worse over time.  May come and go.  May be more noticeable on one side of your body.  May get worse due to stress, fatigue, caffeine, and extreme heat or cold.  How is this diagnosed? Your health care provider can diagnose essential tremor based on your symptoms, medical history, and a physical examination. There is no single test to diagnose an essential tremor. However, your health care provider may perform a variety of tests to rule out other conditions. Tests may include:  Blood and urine tests.  Imaging studies of  your brain, such as: ? CT scan. ? MRI.  A test that measures involuntary muscle movement (electromyogram).  How is this treated? Your tremors may go away without treatment. Mild tremors may not need treatment if they do not affect your day-to-day life. Severe tremors may need to be treated using one or a combination of the following  options:  Medicines. This may include medicine that is injected.  Lifestyle changes.  Physical therapy.  Follow these instructions at home:  Take medicines only as directed by your health care provider.  Limit alcohol intake to no more than 1 drink per day for nonpregnant women and 2 drinks per day for men. One drink equals 12 oz of beer, 5 oz of wine, or 1 oz of hard liquor.  Do not use any tobacco products, including cigarettes, chewing tobacco, or electronic cigarettes. If you need help quitting, ask your health care provider.  Take medicines only as directed by your health care provider.  Avoid extreme heat or cold.  Limit the amount of caffeine you consumeas directed by your health care provider.  Try to get eight hours of sleep each night.  Find ways to manage your stress, such as meditation or yoga.  Keep all follow-up visits as directed by your health care provider. This is important. This includes any physical therapy visits. Contact a health care provider if:  You experience any changes in the location or intensity of your tremors.  You start having a tremor after starting a new medicine.  You have tremor with other symptoms such as: ? Numbness. ? Tingling. ? Pain. ? Weakness.  Your tremor gets worse.  Your tremor interferes with your daily life. This information is not intended to replace advice given to you by your health care provider. Make sure you discuss any questions you have with your health care provider. Document Released: 03/16/2014 Document Revised: 08/01/2015 Document Reviewed: 08/21/2013 Elsevier Interactive Patient Education  [employer]2018 [employer]

## 2017-02-18 NOTE — Progress Notes (Signed)
William CraneDannie Lafayette Vincent is a 51 y.o. male who [name] Medicine today for HTN, Knee and back pain, tremor.  HTN: William Vincent takes medications listed below for hypertension.  He denies chest pain palpitations or shortness of breath.  He feels well otherwise.  Knee pain: William Vincent has chronic knee and back pain.  He uses Norco intermittently for these problems.  He has been out of Norco for the last month or so.  He notes knee pain is felt bilaterally at the medial aspect of the knee.  The pain is worse with activity and better with rest.  He denies locking or catching.  Tremor: William Vincent notes a tremor with right hand motion over the last several months.  This is worse with activity and better with rest.  It interferes with his ability to drink occasionally.  He cannot recall any family history of this.   Past Medical History:  Diagnosis Date  . Gout   . Hypertension   . Kidney stones    Past Surgical History:  Procedure Laterality Date  . TONSILLECTOMY AND ADENOIDECTOMY     Social History   Tobacco Use  . Smoking status: Never Smoker  . Smokeless tobacco: Never Used  Substance Use Topics  . Alcohol use: No   family history is not on file.  ROS as above:  Medications: Current Outpatient Medications  Medication Sig Dispense Refill  . amitriptyline (ELAVIL) 25 MG tablet Take 1 tablet (25 mg total) by mouth at bedtime as needed (pain). 90 tablet 1  . amLODipine (NORVASC) 10 MG tablet Take 1 tablet (10 mg total) by mouth daily. 30 tablet 11  . diclofenac sodium (VOLTAREN) 1 % GEL Apply 4 g topically 4 (four) times daily. To affected joint. 100 g 11  . gabapentin (NEURONTIN) 300 MG capsule Take 2 capsules (600 mg total) by mouth 3 (three) times daily. 540 capsule 1  . hydrochlorothiazide (HYDRODIURIL) 25 MG tablet Take 1 tablet (25 mg total) by mouth daily. 30 tablet 11  .  HYDROcodone-acetaminophen (NORCO) 10-325 MG tablet Take 1 tablet by mouth every 8 (eight) hours as needed. 40 tablet 0  . HYDROcodone-acetaminophen (NORCO) 10-325 MG tablet Take 1 tablet by mouth every 8 (eight) hours as needed. 40 tablet 0  . HYDROcodone-acetaminophen (NORCO) 10-325 MG tablet Take 1 tablet by mouth every 8 (eight) hours as needed. 40 tablet 0  . lisinopril (PRINIVIL,ZESTRIL) 20 MG tablet Take 1 tablet (20 mg total) by mouth daily. 30 tablet 11  . meloxicam (MOBIC) 15 MG tablet Take 1 tablet (15 mg total) by mouth daily. 30 tablet 0  . methocarbamol (ROBAXIN) 500 MG tablet Take 1 tablet (500 mg total) by mouth 3 (three) times daily. 270 tablet 2  . penicillin v potassium (VEETID) 500 MG tablet Take 1 tablet (500 mg total) by mouth 4 (four) times daily. 120 tablet 1  . propranolol ER (INDERAL LA) 60 MG 24 hr capsule Take 1 capsule (60 mg total) by mouth daily. For tremor 90 capsule 0   No current facility-administered medications for this visit.    Allergies  Allergen Reactions  . Flexeril [Cyclobenzaprine] Hives and Rash    Health Maintenance Health Maintenance  Topic Date Due  . TETANUS/TDAP  02/19/2027  . INFLUENZA VACCINE  Completed  . HIV Screening  Completed     Exam:  BP 110/76   Pulse 97   Ht 5\' 10"  (1.778 m)   Wt  261 lb (118.4 kg)   BMI 37.45 kg/m   Wt Readings from Last 5 Encounters:  02/18/17 261 lb (118.4 kg)  12/21/16 267 lb (121.1 kg)  09/18/16 264 lb (119.7 kg)  06/18/16 259 lb (117.5 kg)  03/20/16 257 lb (116.6 kg)    Gen: Well NAD HEENT: EOMI,  MMM Lungs: Normal work of breathing. CTABL Heart: RRR no MRG Abd: NABS, Soft. Nondistended, Nontender Exts: Brisk capillary refill, warm and well perfused.  Neuro: No tremor at rest.  Mild right hand tremor with motion.  Strength coordination and sensation is intact. MSK is: Knees are normal appearing bilaterally tender to palpation at medial joint line.  Normal knee motion.  Normal  gait.   X-ray knee bilaterally pending    Assessment and Plan: 51 y.o. male with  Hypertension blood pressure goal continue current regimen.  Obtain basic fasting labs listed below.  Tremor: Likely benign essential tremor.  Trial of propranolol.  Recheck in 1 month.  Knee pain: Concerning for DJD.  Plan for x-rays and diclofenac gel.  Recheck in 1 month.  Chronic pain: Back pain predominantly.  Refill Norco.  We will work on prior authorization of this medication on occasion.  Urine drug screen obtained today.  Patient researched [facility] Cont[place] Sub[facility] [place].  Tdap given prior to D/C Orders Placed This Encounter  Procedures  . DG Knee Complete 4 Views Left    Please include patellar sunrise, lateral, and weightbearing bilateral AP and bilateral rosenberg views    Standing Status:   Future    Number of Occurrences:   1    Standing Expiration Date:   04/20/2018    Order Specific Question:   Reason for exam:    Answer:   Please include patellar sunrise, lateral, and weightbearing bilateral AP and bilateral rosenberg views    Comments:   Please include patellar sunrise, lateral, and weightbearing bilateral AP and bilateral rosenberg views    Order Specific Question:   Preferred imaging location?    Answer[facility]  . DG Knee Complete 4 Views Right    Please include patellar sunrise, lateral, and weightbearing bilateral AP and bilateral rosenberg views    Standing Status:   Future    Number of Occurrences:   1    Standing Expiration Date:   04/20/2018    Order Specific Question:   Reason for exam:    Answer:   Please include patellar sunrise, lateral, and weightbearing bilateral AP and bilateral rosenberg views    Comments:   Please include patellar sunrise, lateral, and weightbearing bilateral AP and bilateral rosenberg views    Order Specific Question:   Preferred imaging location?    Answer:   [facility]  . Tdap vaccine  greater than or equal to 7yo IM  . CBC  . COMPLETE METABOLIC PANEL WITH GFR  . Lipid Panel w/reflex Direct LDL  . TSH  . Hemoglobin A1c  . Pain Mgmt, Profile 6 Conf w/o mM, U   Meds ordered this encounter  Medications  . HYDROcodone-acetaminophen (NORCO) 10-325 MG tablet    Sig: Take 1 tablet by mouth every 8 (eight) hours as needed.    Dispense:  40 tablet    Refill:  0    Fill today  . HYDROcodone-acetaminophen (NORCO) 10-325 MG tablet    Sig: Take 1 tablet by mouth every 8 (eight) hours as needed.    Dispense:  40 tablet    Refill:  0  Fill in 30 days  . HYDROcodone-acetaminophen (NORCO) 10-325 MG tablet    Sig: Take 1 tablet by mouth every 8 (eight) hours as needed.    Dispense:  40 tablet    Refill:  0    Fill in 60 days  . amitriptyline (ELAVIL) 25 MG tablet    Sig: Take 1 tablet (25 mg total) by mouth at bedtime as needed (pain).    Dispense:  90 tablet    Refill:  1  . diclofenac sodium (VOLTAREN) 1 % GEL    Sig: Apply 4 g topically 4 (four) times daily. To affected joint.    Dispense:  100 g    Refill:  11    Voltaren gel  . gabapentin (NEURONTIN) 300 MG capsule    Sig: Take 2 capsules (600 mg total) by mouth 3 (three) times daily.    Dispense:  540 capsule    Refill:  1  . methocarbamol (ROBAXIN) 500 MG tablet    Sig: Take 1 tablet (500 mg total) by mouth 3 (three) times daily.    Dispense:  270 tablet    Refill:  2    Please consider 90 day supplies to promote better adherence  . propranolol ER (INDERAL LA) 60 MG 24 hr capsule    Sig: Take 1 capsule (60 mg total) by mouth daily. For tremor    Dispense:  90 capsule    Refill:  0     Discussed warning signs or symptoms. Please see discharge instructions. Patient expresses understanding.

## 2017-02-19 ENCOUNTER — Other Ambulatory Visit: Payer: Self-pay | Admitting: Family Medicine

## 2017-02-19 ENCOUNTER — Encounter: Payer: Self-pay | Admitting: Family Medicine

## 2017-02-19 LAB — PAIN MGMT, PROFILE 6 CONF W/O MM, U
6 ACETYLMORPHINE: NEGATIVE ng/mL (ref <10)
Alcohol Metabolites: NEGATIVE ng/mL (ref <500)
Amphetamines: NEGATIVE ng/mL (ref <500)
BENZODIAZEPINES: NEGATIVE ng/mL (ref <100)
Barbiturates: NEGATIVE ng/mL (ref <300)
COCAINE METABOLITE: NEGATIVE ng/mL (ref <150)
CREATININE: 300.6 mg/dL
Marijuana Metabolite: NEGATIVE ng/mL (ref <20)
Methadone Metabolite: NEGATIVE ng/mL (ref <100)
OPIATES: NEGATIVE ng/mL (ref <100)
OXYCODONE: NEGATIVE ng/mL (ref <100)
Oxidant: NEGATIVE ug/mL (ref <200)
PH: 5.57 (ref 4.5–9.0)
PHENCYCLIDINE: NEGATIVE ng/mL (ref <25)

## 2017-02-19 LAB — COMPLETE METABOLIC PANEL WITH GFR
AG Ratio: 1.3 (calc) (ref 1.0–2.5)
ALBUMIN MSPROF: 4.3 g/dL (ref 3.6–5.1)
ALKALINE PHOSPHATASE (APISO): 76 U/L (ref 40–115)
ALT: 79 U/L — AB (ref 9–46)
AST: 53 U/L — AB (ref 10–40)
BILIRUBIN TOTAL: 0.9 mg/dL (ref 0.2–1.2)
BUN: 23 mg/dL (ref 7–25)
CHLORIDE: 99 mmol/L (ref 98–110)
CO2: 27 mmol/L (ref 20–32)
Calcium: 10 mg/dL (ref 8.6–10.3)
Creat: 1.34 mg/dL (ref 0.60–1.35)
GFR, Est African American: 72 mL/min/{1.73_m2} (ref >=60)
GFR, Est Non African American: 62 mL/min/{1.73_m2} (ref >=60)
Globulin: 3.3 g/dL (calc) (ref 1.9–3.7)
Glucose, Bld: 119 mg/dL — ABNORMAL HIGH (ref 65–99)
Potassium: 4.3 mmol/L (ref 3.5–5.3)
Sodium: 137 mmol/L (ref 135–146)
Total Protein: 7.6 g/dL (ref 6.1–8.1)

## 2017-02-19 LAB — LIPID PANEL W/REFLEX DIRECT LDL
CHOLESTEROL: 240 mg/dL — AB (ref <200)
HDL: 53 mg/dL (ref >40)
LDL CHOLESTEROL (CALC): 164 mg/dL — AB
Non-HDL Cholesterol (Calc): 187 mg/dL (calc) — ABNORMAL HIGH (ref <130)
Total CHOL/HDL Ratio: 4.5 (calc) (ref <5.0)
Triglycerides: 111 mg/dL (ref <150)

## 2017-02-19 LAB — HEMOGLOBIN A1C
Hgb A1c MFr Bld: 5.6 % of total Hgb (ref <5.7)
Mean Plasma Glucose: 114 (calc)
eAG (mmol/L): 6.3 (calc)

## 2017-02-19 LAB — CBC
HEMATOCRIT: 50.3 % — AB (ref 38.5–50.0)
Hemoglobin: 16.8 g/dL (ref 13.2–17.1)
MCH: 29.7 pg (ref 27.0–33.0)
MCHC: 33.4 g/dL (ref 32.0–36.0)
MCV: 89 fL (ref 80.0–100.0)
MPV: 10.7 fL (ref 7.5–12.5)
Platelets: 430 10*3/uL — ABNORMAL HIGH (ref 140–400)
RBC: 5.65 10*6/uL (ref 4.20–5.80)
RDW: 14.7 % (ref 11.0–15.0)
WBC: 9.4 10*3/uL (ref 3.8–10.8)

## 2017-02-19 LAB — TSH: TSH: 2.72 m[IU]/L (ref 0.40–4.50)

## 2017-02-19 MED ORDER — ATORVASTATIN CALCIUM 40 MG PO TABS: 40.0000 mg | ORAL_TABLET | Freq: Every day | ORAL | 0 refills | Status: DC

## 2017-02-19 MED FILL — ATORVASTATIN 40 MG TABLET: 40 | 30 days supply | Qty: 30 | Fill #0

## 2017-02-24 ENCOUNTER — Telehealth: Payer: Self-pay | Admitting: Family Medicine

## 2017-02-24 NOTE — Telephone Encounter (Signed)
Submitted for approval on [employer]. Awaiting confirmation.  

## 2017-02-24 NOTE — Telephone Encounter (Signed)
-----   Message from Rodolph BongEvan S Corey, MD sent at 02/19/2017 11:15 AM EST ----- Regarding: [employer] has Medicaid. Can we work with that to get some version of orthovisc?  Clayburn PertEvan

## 2017-03-23 ENCOUNTER — Ambulatory Visit: Payer: Medicaid Other | Admitting: Family Medicine

## 2017-03-23 ENCOUNTER — Ambulatory Visit: Payer: Self-pay | Admitting: Family Medicine

## 2017-03-23 ENCOUNTER — Encounter: Payer: Self-pay | Admitting: Family Medicine

## 2017-03-23 VITALS — BP 106/70 | HR 82 | Ht 70.0 in | Wt 258.0 lb

## 2017-03-23 DIAGNOSIS — M25561 Pain in right knee: Secondary | ICD-10-CM

## 2017-03-23 DIAGNOSIS — R740 Nonspecific elevation of levels of transaminase and lactic acid dehydrogenase [LDH]: Secondary | ICD-10-CM

## 2017-03-23 DIAGNOSIS — I10 Essential (primary) hypertension: Secondary | ICD-10-CM | POA: Diagnosis not present

## 2017-03-23 DIAGNOSIS — G8929 Other chronic pain: Secondary | ICD-10-CM | POA: Diagnosis not present

## 2017-03-23 DIAGNOSIS — M25562 Pain in left knee: Secondary | ICD-10-CM | POA: Diagnosis not present

## 2017-03-23 DIAGNOSIS — M5441 Lumbago with sciatica, right side: Secondary | ICD-10-CM

## 2017-03-23 DIAGNOSIS — R7401 Elevation of levels of liver transaminase levels: Secondary | ICD-10-CM

## 2017-03-23 DIAGNOSIS — M5442 Lumbago with sciatica, left side: Secondary | ICD-10-CM | POA: Diagnosis not present

## 2017-03-23 DIAGNOSIS — Z0189 Encounter for other specified special examinations: Secondary | ICD-10-CM

## 2017-03-23 MED ORDER — PROPRANOLOL HCL ER 60 MG PO CP24: 60.0000 mg | ORAL_CAPSULE | Freq: Every day | ORAL | 1 refills | Status: DC

## 2017-03-23 MED ORDER — MELOXICAM 15 MG PO TABS: 15.0000 mg | ORAL_TABLET | Freq: Every day | ORAL | 0 refills | Status: DC

## 2017-03-23 MED ORDER — HYDROCHLOROTHIAZIDE 25 MG PO TABS: 25.0000 mg | ORAL_TABLET | Freq: Every day | ORAL | 1 refills | Status: DC

## 2017-03-23 MED ORDER — ATORVASTATIN CALCIUM 40 MG PO TABS: 40.0000 mg | ORAL_TABLET | Freq: Every day | ORAL | 1 refills | Status: DC

## 2017-03-23 MED ORDER — AMLODIPINE BESYLATE 10 MG PO TABS: 10.0000 mg | ORAL_TABLET | Freq: Every day | ORAL | 1 refills | Status: DC

## 2017-03-23 MED ORDER — LISINOPRIL 20 MG PO TABS: 20.0000 mg | ORAL_TABLET | Freq: Every day | ORAL | 1 refills | Status: DC

## 2017-03-23 MED FILL — AMLODIPINE BESYLATE 10 MG T: 10 | 30 days supply | Qty: 30 | Fill #0

## 2017-03-23 MED FILL — HYDROCHLOROTHIAZIDE 25 MG T: 25 | 30 days supply | Qty: 30 | Fill #0

## 2017-03-23 MED FILL — LISINOPRIL 20 MG TABLET: 20 | 30 days supply | Qty: 30 | Fill #0

## 2017-03-23 MED FILL — ATORVASTATIN 40 MG TABLET: 40 | 30 days supply | Qty: 30 | Fill #0

## 2017-03-23 MED FILL — PROPRANOLOL HCL ER 60 MG CP: 60 | 30 days supply | Qty: 30 | Fill #0

## 2017-03-23 MED FILL — MELOXICAM 15 MG TABLET: 15 | 30 days supply | Qty: 30 | Fill #0

## 2017-03-23 NOTE — Patient Instructions (Addendum)
Thank you for coming in today. Get fasting labs in a few week or a month.  You can go to any [facility] location.  Continue medicine.  If all is well we check back in 6 months.   Nonalcoholic Fatty Liver Disease Diet Nonalcoholic fatty liver disease is a condition that causes fat to accumulate in and around the liver. The disease makes it harder for the liver to work the way that it should. Following a healthy diet can help to keep nonalcoholic fatty liver disease under control. It can also help to prevent or improve conditions that are associated with the disease, such as heart disease, diabetes, high blood pressure, and abnormal cholesterol levels. Along with regular exercise, this diet:  Promotes weight loss.  Helps to control blood sugar levels.  Helps to improve the way that the body uses insulin.  What do I need to know about this diet?  Use the glycemic index (GI) to plan your meals. The index tells you how quickly a food will raise your blood sugar. Choose low-GI foods. These foods take a longer time to raise blood sugar.  Keep track of how many calories you take in. Eating the right amount of calories will help you to achieve a healthy weight.  You may want to follow a Mediterranean diet. This diet includes a lot of vegetables, lean meats or fish, whole grains, fruits, and healthy oils and fats. What foods can I eat? Grains Whole grains, such as whole-wheat or whole-grain breads, crackers, tortillas, cereals, and pasta. Stone-ground whole wheat. Pumpernickel bread. Unsweetened oatmeal. Bulgur. Barley. Quinoa. Brown or wild rice. Corn or whole-wheat flour tortillas. Vegetables Lettuce. Spinach. Peas. Beets. Cauliflower. Cabbage. Broccoli. Carrots. Tomatoes. Squash. Eggplant. Herbs. Peppers. Onions. Cucumbers. Brussels sprouts. Yams and sweet potatoes. Beans. Lentils. Fruits Bananas. Apples. Oranges. Grapes. Papaya. Mango. Pomegranate. Kiwi. Grapefruit. Cherries. Meats and Other  Protein Sources Seafood and shellfish. Lean meats. Poultry. Tofu. Dairy Low-fat or fat-free dairy products, such as yogurt, cottage cheese, and cheese. Beverages Water. Sugar-free drinks. Tea. Coffee. Low-fat or skim milk. Milk alternatives, such as soy or almond milk. Real fruit juice. Condiments Mustard. Relish. Low-fat, low-sugar ketchup and barbecue sauce. Low-fat or fat-free mayonnaise. Sweets and Desserts Sugar-free sweets. Fats and Oils Avocado. Canola or olive oil. Nuts and nut butters. Seeds. The items listed above may not be a complete list of recommended foods or beverages. Contact your dietitian for more options. What foods are not recommended? Palm oil and coconut oil. Processed foods. Fried foods. Sweetened drinks, such as sweet tea, milkshakes, snow cones, iced sweet drinks, and sodas. Alcohol. Sweets. Foods that contain a lot of salt or sodium. The items listed above may not be a complete list of foods and beverages to avoid. Contact your dietitian for more information. This information is not intended to replace advice given to you by your health care provider. Make sure you discuss any questions you have with your health care provider. Document Released: 07/10/2014 Document Revised: 08/01/2015 Document Reviewed: 03/20/2014 Elsevier Interactive Patient Education  [employer]2018 [employer] Inc[employer]

## 2017-03-23 NOTE — Progress Notes (Signed)
William Vincent is a 52 y.o. male who presents to [name]: Primary Care Sports Medicine today for HTN, HLD, and Pain.  HTN: [name] takes the medicine listed below which he tolerates well with no chest pain palpitations or shortness of breath.  No Lightheadedness or dizziness  Hyperlipidemia: [name] is found to have elevated cholesterol at the last check and was started on atorvastatin.  Medication well with no significant muscle aches or pain  Elevated liver enzymes: [name] was found to have mild transaminitis last month.  He feels well with no abdominal pain.  He does not drink excessive amounts of alcohol..  Pain: [name] notes chronic back and knee pain.  He takes NSAIDs, amitriptyline, gabapentin for pain.  He previously was also managed with hydrocodone but this is not currently covered with Medicaid for chronic pain management.  He notes that his pain is not as well-controlled as it was with hydrocodone but he is satisfied with how he is currently doing.   Past Medical History:  Diagnosis Date  . Gout   . Hypertension   . Kidney stones    Past Surgical History:  Procedure Laterality Date  . TONSILLECTOMY AND ADENOIDECTOMY     Social History   Tobacco Use  . Smoking status: Never Smoker  . Smokeless tobacco: Never Used  Substance Use Topics  . Alcohol use: No   family history is not on file.  ROS as above:  Medications: Current Outpatient Medications  Medication Sig Dispense Refill  . amitriptyline (ELAVIL) 25 MG tablet Take 1 tablet (25 mg total) by mouth at bedtime as needed (pain). 90 tablet 1  . amLODipine (NORVASC) 10 MG tablet Take 1 tablet (10 mg total) by mouth daily. 90 tablet 1  . atorvastatin (LIPITOR) 40 MG tablet Take 1 tablet (40 mg total) by mouth daily. 90 tablet 1  . diclofenac sodium (VOLTAREN) 1 % GEL Apply 4 g topically 4 (four) times daily. To affected joint.  100 g 11  . gabapentin (NEURONTIN) 300 MG capsule Take 2 capsules (600 mg total) by mouth 3 (three) times daily. 540 capsule 1  . hydrochlorothiazide (HYDRODIURIL) 25 MG tablet Take 1 tablet (25 mg total) by mouth daily. 90 tablet 1  . lisinopril (PRINIVIL,ZESTRIL) 20 MG tablet Take 1 tablet (20 mg total) by mouth daily. 90 tablet 1  . meloxicam (MOBIC) 15 MG tablet Take 1 tablet (15 mg total) by mouth daily. 90 tablet 0  . methocarbamol (ROBAXIN) 500 MG tablet Take 1 tablet (500 mg total) by mouth 3 (three) times daily. 270 tablet 2  . penicillin v potassium (VEETID) 500 MG tablet Take 1 tablet (500 mg total) by mouth 4 (four) times daily. 120 tablet 1  . propranolol ER (INDERAL LA) 60 MG 24 hr capsule Take 1 capsule (60 mg total) by mouth daily. For tremor 90 capsule 1   No current facility-administered medications for this visit.    Allergies  Allergen Reactions  . Flexeril [Cyclobenzaprine] Hives and Rash    Health Maintenance Health Maintenance  Topic Date Due  . TETANUS/TDAP  02/19/2027  . INFLUENZA VACCINE  Completed  . HIV Screening  Completed     Exam:  BP 106/70   Pulse 82   Ht 5\' 10"  (1.778 m)   Wt 258 lb (117 kg)   BMI 37.02 kg/m   Wt Readings from Last 5 Encounters:  03/23/17 258 lb (117 kg)  02/18/17 261 lb (118.4 kg)  12/21/16 267 lb (121.1 kg)  09/18/16 264 lb (119.7 kg)  06/18/16 259 lb (117.5 kg)    Gen: Well NAD HEENT: EOMI,  MMM Lungs: Normal work of breathing. CTABL Heart: RRR no MRG Abd: NABS, Soft. Nondistended, Nontender Exts: Brisk capillary refill, warm and well perfused.  MSK L spine nontender normal motion.  And antalgic gait present    Chemistry      Component Value Date/Time   NA 137 02/18/2017 0903   K 4.3 02/18/2017 0903   CL 99 02/18/2017 0903   CO2 27 02/18/2017 0903   BUN 23 02/18/2017 0903   CREATININE 1.34 02/18/2017 0903      Component Value Date/Time   CALCIUM 10.0 02/18/2017 0903   ALKPHOS 62 05/13/2015 1425   AST 53  (H) 02/18/2017 0903   ALT 79 (H) 02/18/2017 0903   BILITOT 0.9 02/18/2017 0903      Lab Results  Component Value Date   CHOL 240 (H) 02/18/2017   HDL 53 02/18/2017   LDLCALC 88 04/16/2015   TRIG 111 02/18/2017   CHOLHDL 4.5 02/18/2017    No results found for this or any previous visit (from the past 72 hour(s)). No results found.    Assessment and Plan: 52 y.o. male with  Hypertension: Well-controlled continue current management.  Hyperlipidemia: Not well controlled.  Continue atorvastatin and recheck lipids at the next visit.  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  Recheck metabolic panel in a month or so.  If still elevated we will proceed with a liver US and further workup.   Chronic pain: Doing reasonably well.  Continue current regimen.  Recheck in 6 months.   Orders Placed This Encounter  Procedures  . COMPLETE METABOLIC PANEL WITH GFR  . Lipid Panel w/reflex Direct LDL   Meds ordered this encounter  Medications  . propranolol ER (INDERAL LA) 60 MG 24 hr capsule    Sig: Take 1 capsule (60 mg total) by mouth daily. For tremor    Dispense:  90 capsule    Refill:  1  . meloxicam (MOBIC) 15 MG tablet    Sig: Take 1 tablet (15 mg total) by mouth daily.    Dispense:  90 tablet    Refill:  0  . hydrochlorothiazide (HYDRODIURIL) 25 MG tablet    Sig: Take 1 tablet (25 mg total) by mouth daily.    Dispense:  90 tablet    Refill:  1  . lisinopril (PRINIVIL,ZESTRIL) 20 MG tablet    Sig: Take 1 tablet (20 mg total) by mouth daily.    Dispense:  90 tablet    Refill:  1  . atorvastatin (LIPITOR) 40 MG tablet    Sig: Take 1 tablet (40 mg total) by mouth daily.    Dispense:  90 tablet    Refill:  1  . amLODipine (NORVASC) 10 MG tablet    Sig: Take 1 tablet (10 mg total) by mouth daily.    Dispense:  90 tablet    Refill:  1     Discussed warning signs or symptoms. Please see discharge instructions. Patient expresses  understanding.

## 2017-05-03 MED FILL — PROPRANOLOL HCL ER 60 MG CP: 60 | 30 days supply | Qty: 30 | Fill #1

## 2017-05-03 MED FILL — ATORVASTATIN 40 MG TABLET: 40 | 30 days supply | Qty: 30 | Fill #1

## 2017-05-06 MED FILL — GABAPENTIN 300 MG CAPSULE: 300 | 30 days supply | Qty: 180 | Fill #1

## 2017-05-25 ENCOUNTER — Encounter (HOSPITAL_COMMUNITY): Payer: Self-pay | Admitting: Emergency Medicine

## 2017-05-25 ENCOUNTER — Ambulatory Visit (HOSPITAL_COMMUNITY)
Admission: EM | Admit: 2017-05-25 | Discharge: 2017-05-25 | Disposition: A | Discharge status: Home / Self Care | Payer: Medicaid Other | Attending: Family Medicine | Admitting: Family Medicine

## 2017-05-25 DIAGNOSIS — R197 Diarrhea, unspecified: Secondary | ICD-10-CM

## 2017-05-25 DIAGNOSIS — E860 Dehydration: Secondary | ICD-10-CM

## 2017-05-25 DIAGNOSIS — R0981 Nasal congestion: Secondary | ICD-10-CM

## 2017-05-25 LAB — POCT I-STAT, CHEM 8
BUN: 17 mg/dL (ref 6–20)
CHLORIDE: 104 mmol/L (ref 101–111)
Calcium, Ion: 1.11 mmol/L — ABNORMAL LOW (ref 1.15–1.40)
Creatinine, Ser: 1.4 mg/dL — ABNORMAL HIGH (ref 0.61–1.24)
Glucose, Bld: 120 mg/dL — ABNORMAL HIGH (ref 65–99)
HEMATOCRIT: 51 % (ref 39.0–52.0)
Hemoglobin: 17.3 g/dL — ABNORMAL HIGH (ref 13.0–17.0)
Potassium: 4.4 mmol/L (ref 3.5–5.1)
SODIUM: 138 mmol/L (ref 135–145)
TCO2: 22 mmol/L (ref 22–32)

## 2017-05-25 MED ORDER — FLUTICASONE PROPIONATE 50 MCG/ACT NA SUSP: 1.0000 | Freq: Every day | NASAL | 2 refills | Status: DC

## 2017-05-25 MED ORDER — SODIUM CHLORIDE 0.9 % IV BOLUS (SEPSIS)
1000.0000 mL | Freq: Once | INTRAVENOUS | Status: AC
  Administered 2017-05-25: 1000 mL via INTRAVENOUS

## 2017-05-25 NOTE — Discharge Instructions (Signed)
Liquids only tonight. Pedialyte, Gatorade, soup/broth, jello. Increase fluid intake.  As symptoms improve may advance to bland diet as tolerated. If develop increased dizziness, weakness, abdominal pain, blood to stool or otherwise worsening please go to Er. Please follow up with your primary care doctor for recheck of symptoms/labs in the next week.

## 2017-05-25 NOTE — ED Provider Notes (Signed)
[facility]    CSN: [id number] Arrival date & time: 05/25/17  1831     History   Chief Complaint Chief Complaint  Patient presents with  . Nasal Congestion  . Diarrhea    HPI William Vincent is a 52 y.o. male.   William Vincent presents with his daughter with complaints of diarrhea which started two days ago. Two episodes of diarrhea today. Without blood. States stomach feels queasy but without nausea or vomiting. Has been drinking some and eating today although has eaten less today than usual due to queasy and cramping sensation. Urinating. No fevers. No known ill contacts. without cough. Mild congestion today with runny nose. Denies previous similar. Denies pain. Takes penicillin daily for dental issue, has done so for a year. No other recent antibiotics, no recent travel. Has not taken any medications for symptoms. History of htn, kidney stones, gout, chronic back and knee pain.   ROS per HPI.       Past Medical History:  Diagnosis Date  . Gout   . Hypertension   . Kidney stones     Patient Active Problem List   Diagnosis Date Noted  . Transaminitis 03/23/2017  . Knee pain, bilateral 02/18/2017  . HTN (hypertension) 11/15/2015  . Trigger finger, left ring finger 11/15/2015  . Chronic back pain 08/21/2015  . Major depression 08/21/2015  . 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 TONSILLECTOMY AND ADENOIDECTOMY         Home Medications    Prior to Admission medications   Medication Sig Start Date End Date Taking? Authorizing Provider  amitriptyline (ELAVIL) 25 MG tablet Take 1 tablet (25 mg total) by mouth at bedtime as needed (pain). 02/18/17   Rodolph Bongorey, Evan S, MD  amLODipine (NORVASC) 10 MG tablet Take 1 tablet (10 mg total) by mouth daily. 03/23/17   Rodolph Bongorey, Evan S, MD  atorvastatin (LIPITOR) 40 MG tablet Take 1 tablet (40 mg total) by mouth daily.  03/23/17   Rodolph Bongorey, Evan S, MD  diclofenac sodium (VOLTAREN) 1 % GEL Apply 4 g topically 4 (four) times daily. To affected joint. 02/18/17   Rodolph Bongorey, Evan S, MD  fluticasone (FLONASE) 50 MCG/ACT nasal spray Place 1 spray into both nostrils daily. 05/25/17   Georgetta HaberBurky, Markese Bloxham B, NP  gabapentin (NEURONTIN) 300 MG capsule Take 2 capsules (600 mg total) by mouth 3 (three) times daily. 02/18/17   Rodolph Bongorey, Evan S, MD  hydrochlorothiazide (HYDRODIURIL) 25 MG tablet Take 1 tablet (25 mg total) by mouth daily. 03/23/17   Rodolph Bongorey, Evan S, MD  lisinopril (PRINIVIL,ZESTRIL) 20 MG tablet Take 1 tablet (20 mg total) by mouth daily. 03/23/17   Rodolph Bongorey, Evan S, MD  meloxicam (MOBIC) 15 MG tablet Take 1 tablet (15 mg total) by mouth daily. 03/23/17   Rodolph Bongorey, Evan S, MD  methocarbamol (ROBAXIN) 500 MG tablet Take 1 tablet (500 mg total) by mouth 3 (three) times daily. 02/18/17   Rodolph Bongorey, Evan S, MD  penicillin v potassium (VEETID) 500 MG tablet Take 1 tablet (500 mg total) by mouth 4 (four) times daily. 05/28/16   Rodolph Bongorey, Evan S, MD  propranolol ER (INDERAL LA) 60 MG 24 hr capsule Take 1 capsule (60 mg total) by mouth daily. For tremor 03/23/17   Rodolph Bongorey, Evan S, MD    Family History History reviewed. No pertinent family history.  Social History Social History   Tobacco  Use  . Smoking status: Never Smoker  . Smokeless tobacco: Never Used  Substance Use Topics  . Alcohol use: No  . Drug use: No     Allergies   Flexeril [cyclobenzaprine]   Review of Systems Review of Systems   Physical Exam Triage Vital Signs ED Triage Vitals [05/25/17 1917]  Enc Vitals Group     BP 102/71     Pulse Rate (!) 108     Resp 18     Temp 98.6 F (37 C)     Temp Source Oral     SpO2 99 %     Weight      Height      Head Circumference      Peak Flow      Pain Score      Pain Loc      Pain Edu?      Excl. in GC?    No data found.  Updated Vital Signs BP 106/61   Pulse 71   Temp 98.7 F (37.1 C)   Resp 18   SpO2 100%    Visual Acuity Right Eye Distance:   Left Eye Distance:   Bilateral Distance:    Right Eye Near:   Left Eye Near:    Bilateral Near:     Physical Exam  Constitutional: He is oriented to person, place, and time. He appears well-developed and well-nourished.  HENT:  Head: Normocephalic and atraumatic.  Right Ear: Tympanic membrane, external ear and ear canal normal.  Left Ear: Tympanic membrane, external ear and ear canal normal.  Nose: Nose normal. Right sinus exhibits no maxillary sinus tenderness and no frontal sinus tenderness. Left sinus exhibits no maxillary sinus tenderness and no frontal sinus tenderness.  Mouth/Throat: Uvula is midline, oropharynx is clear and moist and mucous membranes are normal.  Eyes: Conjunctivae are normal. Pupils are equal, round, and reactive to light.  Neck: Normal range of motion.  Cardiovascular: Normal rate and regular rhythm.  Pulmonary/Chest: Effort normal and breath sounds normal.  Abdominal: Soft. He exhibits distension. He exhibits no mass. There is no tenderness. There is no rebound and no guarding. No hernia.  Lymphadenopathy:    He has no cervical adenopathy.  Neurological: He is alert and oriented to person, place, and time.  Skin: Skin is warm and dry.  Vitals reviewed.  Patient raised from lying to sitting in bed after abdominal exam, complained of feeling dizzy and briefly lost consciousness. Lowered flat, legs raised. HR dropped to 55 once first able to assess.   EKG obtained after syncopal episode- NSR rate 77 without acute changes.   UC Treatments / Results  Labs (all labs ordered are listed, but only abnormal results are displayed) Labs Reviewed  POCT I-STAT, CHEM 8 - Abnormal; Notable for the following components:      Result Value   Creatinine, Ser 1.40 (*)    Glucose, Bld 120 (*)    Calcium, Ion 1.11 (*)    Hemoglobin 17.3 (*)    All other components within normal limits    EKG  EKG Interpretation None        Radiology No results found.  Procedures Procedures (including critical care time)  Medications Ordered in UC Medications  sodium chloride 0.9 % bolus 1,000 mL (0 mLs Intravenous Stopped 05/25/17 2053)     Initial Impression / Assessment and Plan / UC Course  I have reviewed the triage vital signs and the nursing notes.  Pertinent labs & imaging  results that were available during my care of the patient were reviewed by me and considered in my medical decision making (see chart for details).     IV fluids administered after syncopal episode during exam. Patient with 1 episode of diarrhea, ambulated to the bathroom without difficulty. Feeling much improved. ekg reassuring. Without abdominal pain. Encouraged fluid intake. Return precautions provided. Follow up with PCP for recheck in the next week. Patient verbalized understanding and agreeable to plan.  Ambulatory out of clinic without difficulty.     Case discussed and reviewed, Dr. Milus Glazier, who also was at bedside at time of syncopal episode.    Final Clinical Impressions(s) / UC Diagnoses   Final diagnoses:  Diarrhea of presumed infectious origin  Dehydration  Nasal congestion    ED Discharge Orders        Ordered    fluticasone (FLONASE) 50 MCG/ACT nasal spray  Daily     05/25/17 2101       Controlled Substance Prescriptions [place] Controlled Substance Registry consulted? Not Applicable   Georgetta Haber, NP 05/25/17 2106

## 2017-05-25 NOTE — ED Notes (Signed)
EKG given to Dorene GrebeNatalie, NP

## 2017-05-25 NOTE — ED Triage Notes (Signed)
Pt sts nasal congestion and diarrhea

## 2017-06-08 MED FILL — PROPRANOLOL HCL ER 60 MG CP: 60 | 30 days supply | Qty: 30 | Fill #2

## 2017-06-09 MED FILL — MELOXICAM 15 MG TABLET: 15 | 30 days supply | Qty: 30 | Fill #1

## 2017-06-09 MED FILL — ATORVASTATIN CALCIUM 40 MG: 40 | 30 days supply | Qty: 30 | Fill #2

## 2017-07-11 ENCOUNTER — Other Ambulatory Visit: Payer: Self-pay | Admitting: Family Medicine

## 2017-07-12 MED ORDER — MELOXICAM 15 MG PO TABS: 15.0000 mg | ORAL_TABLET | Freq: Every day | ORAL | 0 refills | Status: DC

## 2017-07-12 MED FILL — MELOXICAM 15 MG TABLET: 15 | 30 days supply | Qty: 30 | Fill #2

## 2017-07-12 MED FILL — PROPRANOLOL HCL ER 60 MG CP: 60 | 30 days supply | Qty: 30 | Fill #3

## 2017-07-12 MED FILL — METHOCARBAMOL 500 MG TABS: 500 | 30 days supply | Qty: 90 | Fill #1

## 2017-07-12 MED FILL — LISINOPRIL 20 MG TABLET: 20 | 30 days supply | Qty: 30 | Fill #1

## 2017-07-12 MED FILL — HYDROCHLOROTHIAZIDE 25 MG T: 25 | 30 days supply | Qty: 30 | Fill #1

## 2017-07-12 MED FILL — ATORVASTATIN CALCIUM 40 MG: 40 | 30 days supply | Qty: 30 | Fill #3

## 2017-07-12 MED FILL — AMLODIPINE BESYLATE 10 MG T: 10 | 30 days supply | Qty: 30 | Fill #1

## 2017-07-12 MED FILL — AMITRIPTYLINE HCL 25 MG TAB: 25 | 30 days supply | Qty: 30 | Fill #1

## 2017-07-12 MED FILL — GABAPENTIN 300 MG CAPSULE: 300 | 30 days supply | Qty: 180 | Fill #2

## 2017-07-14 ENCOUNTER — Encounter: Payer: Self-pay | Admitting: Family Medicine

## 2017-07-14 ENCOUNTER — Ambulatory Visit (INDEPENDENT_AMBULATORY_CARE_PROVIDER_SITE_OTHER): Payer: Medicaid Other

## 2017-07-14 ENCOUNTER — Ambulatory Visit (INDEPENDENT_AMBULATORY_CARE_PROVIDER_SITE_OTHER): Payer: Medicaid Other | Admitting: Family Medicine

## 2017-07-14 VITALS — BP 113/80 | HR 97 | Ht 71.0 in | Wt 249.0 lb

## 2017-07-14 DIAGNOSIS — I10 Essential (primary) hypertension: Secondary | ICD-10-CM

## 2017-07-14 DIAGNOSIS — G8929 Other chronic pain: Secondary | ICD-10-CM | POA: Diagnosis not present

## 2017-07-14 DIAGNOSIS — M5442 Lumbago with sciatica, left side: Secondary | ICD-10-CM

## 2017-07-14 DIAGNOSIS — M25561 Pain in right knee: Secondary | ICD-10-CM | POA: Diagnosis not present

## 2017-07-14 DIAGNOSIS — R740 Nonspecific elevation of levels of transaminase and lactic acid dehydrogenase [LDH]: Secondary | ICD-10-CM

## 2017-07-14 DIAGNOSIS — M79641 Pain in right hand: Secondary | ICD-10-CM

## 2017-07-14 DIAGNOSIS — M25562 Pain in left knee: Secondary | ICD-10-CM | POA: Diagnosis not present

## 2017-07-14 DIAGNOSIS — M12842 Other specific arthropathies, not elsewhere classified, left hand: Secondary | ICD-10-CM | POA: Diagnosis not present

## 2017-07-14 DIAGNOSIS — M5441 Lumbago with sciatica, right side: Secondary | ICD-10-CM

## 2017-07-14 DIAGNOSIS — M79642 Pain in left hand: Secondary | ICD-10-CM | POA: Diagnosis not present

## 2017-07-14 DIAGNOSIS — R7401 Elevation of levels of liver transaminase levels: Secondary | ICD-10-CM

## 2017-07-14 DIAGNOSIS — M170 Bilateral primary osteoarthritis of knee: Secondary | ICD-10-CM

## 2017-07-14 DIAGNOSIS — M12841 Other specific arthropathies, not elsewhere classified, right hand: Secondary | ICD-10-CM

## 2017-07-14 MED ORDER — PROPRANOLOL HCL ER 80 MG PO CP24: 80.0000 mg | ORAL_CAPSULE | Freq: Every day | ORAL | 1 refills | Status: DC

## 2017-07-14 MED ORDER — PENICILLIN V POTASSIUM 500 MG PO TABS: 500.0000 mg | ORAL_TABLET | Freq: Four times a day (QID) | ORAL | 2 refills | Status: DC

## 2017-07-14 MED ORDER — FLUTICASONE PROPIONATE 50 MCG/ACT NA SUSP: 1.0000 | Freq: Every day | NASAL | 2 refills | Status: DC

## 2017-07-14 MED FILL — PENICILLIN VK 500 MG TABLET: 500 | 15 days supply | Qty: 60 | Fill #0

## 2017-07-14 MED FILL — FLUTICASONE PROP 50 MCG SPR: 50 | 60 days supply | Qty: 16 | Fill #0

## 2017-07-14 NOTE — Patient Instructions (Addendum)
Thank you for coming in today. I will contact your attorney  Sheron Nightingale Phone Number: [phone] Fax Number: [phone] [email or web address]   I will get labs and xray today.  Continue current treatment.  We will change things as we learn more.   Recheck in 3 months or sooner if needed.

## 2017-07-14 NOTE — Progress Notes (Signed)
William Vincent is a 52 y.o. male who presents to [facility]: Primary Care Sports Medicine today for hand and knee pain.  William Vincent notes pain in bilateral hands.  He notes pain and swelling occurs across his MCPs bilaterally.  He denies any injury.  He notes that his mother has a history of rheumatoid arthritis.  He is tried diclofenac gel and oral meloxicam and warm compress which have not helped much.  Additionally he notes chronic knee pain bilaterally.  He had x-rays in the past and thought to have DJD.  He said steroid injections which helped only temporarily.  He continues to use diclofenac gel and meloxicam.  Hypertension: Taking medications listed below with no chest pain palpitations shortness of breath.  Essential tremor: Identified at last visit.  Propranolol 60 mg extended release daily started January.  He notes this is has helped but not fully.  He continues to experience some tremor bothersome especially with fine motor tasks such as eating and drinking.   Past Medical History:  Diagnosis Date  . Gout   . Hypertension   . Kidney stones    Past Surgical History:  Procedure Laterality Date  . TONSILLECTOMY AND ADENOIDECTOMY     Social History   Tobacco Use  . Smoking status: Never Smoker  . Smokeless tobacco: Never Used  Substance Use Topics  . Alcohol use: No   family history is not on file.  ROS as above:  Medications: Current Outpatient Medications  Medication Sig Dispense Refill  . amitriptyline (ELAVIL) 25 MG tablet Take 1 tablet (25 mg total) by mouth at bedtime as needed (pain). 90 tablet 1  . amLODipine (NORVASC) 10 MG tablet Take 1 tablet (10 mg total) by mouth daily. 90 tablet 1  . atorvastatin (LIPITOR) 40 MG tablet Take 1 tablet (40 mg total) by mouth daily. 90 tablet 1  . diclofenac sodium (VOLTAREN) 1 % GEL Apply 4 g topically 4 (four) times daily. To affected  joint. 100 g 11  . fluticasone (FLONASE) 50 MCG/ACT nasal spray Place 1 spray into both nostrils daily. 16 g 2  . gabapentin (NEURONTIN) 300 MG capsule Take 2 capsules (600 mg total) by mouth 3 (three) times daily. 540 capsule 1  . hydrochlorothiazide (HYDRODIURIL) 25 MG tablet Take 1 tablet (25 mg total) by mouth daily. 90 tablet 1  . lisinopril (PRINIVIL,ZESTRIL) 20 MG tablet Take 1 tablet (20 mg total) by mouth daily. 90 tablet 1  . meloxicam (MOBIC) 15 MG tablet Take 1 tablet (15 mg total) by mouth daily. 90 tablet 0  . methocarbamol (ROBAXIN) 500 MG tablet Take 1 tablet (500 mg total) by mouth 3 (three) times daily. 270 tablet 2  . penicillin v potassium (VEETID) 500 MG tablet Take 1 tablet (500 mg total) by mouth 4 (four) times daily. 60 tablet 2  . propranolol ER (INDERAL LA) 80 MG 24 hr capsule Take 1 capsule (80 mg total) by mouth daily. For tremor 90 capsule 1   No current facility-administered medications for this visit.    Allergies  Allergen Reactions  . Flexeril [Cyclobenzaprine] Hives and Rash    Health Maintenance Health Maintenance  Topic Date Due  . INFLUENZA VACCINE  10/07/2017  . TETANUS/TDAP  02/19/2027  . HIV Screening  Completed     Exam:  BP 113/80   Pulse 97   Ht  (1.803 m)   Wt 249 lb (112.9 kg)   BMI 34.73 kg/m  Gen: Well NAD HEENT: EOMI,  MMM Lungs: Normal work of breathing. CTABL Heart: RRR no MRG Abd: NABS, Soft. Nondistended, Nontender Exts: Brisk capillary refill, warm and well perfused.  MSK: Hands bilaterally show mild swelling at MCPs.  Mild synovitis to palpation bilaterally.  No significant ulnar deviation.  Extension and grip strength is intact. Knees bilaterally no effusion normal motion crepitations on extension.  Normal gait.  X-ray hand bilaterally independent personal review of images shows no acute fractures.  No severe degenerative changes.  No erosions in MCPs. Awaiting formal radiology review.  Knee x-ray bilaterally  from December 2018 reviewed     Assessment and Plan: 52 y.o. male with  Hand pain bilaterally: Unclear if there is a rheumatologic process here.  Patient does have some synovitis in his MCPs bilaterally.  However his x-ray is unremarkable.  I think is worthwhile pursuing a rheumatologic work-up.  He had a partial rheumatologic work-up several years ago when he first started having back pain that was largely negative.  He does however have a family history that is concerning.  In the meantime continue diclofenac gel and meloxicam.  Knee pain: DJD.  Will pursue rheumatologic work-up assuming negative next step would be trial of Visco supplementation.  This is not covered with Medicaid health insurance.  May consider manufactures assistance program.  Hypertension: Doing well continue current regimen.  Check basic fasting labs in the near future.  Essential tremor: Improved but not fully controlled.  Increase propranolol from 60 mg daily to 80 mg daily  Recheck in about a month.   Orders Placed This Encounter  Procedures  . DG Hand Complete Left    Standing Status:   Future    Number of Occurrences:   1    Standing Expiration Date:   09/14/2018    Order Specific Question:   Reason for Exam (SYMPTOM  OR DIAGNOSIS REQUIRED)    Answer:   eval MCP pain BL hand. Suspct early RA?    Order Specific Question:   Preferred imaging location?    Answer:   [facility]    Order Specific Question:   Radiology Contrast Protocol - do NOT remove file path    Answer:   \\[email or web address]s.pdf  . DG Hand Complete Right    Standing Status:   Future    Number of Occurrences:   1    Standing Expiration Date:   09/14/2018    Order Specific Question:   Reason for Exam (SYMPTOM  OR DIAGNOSIS REQUIRED)    Answer:   eval MCP pain BL hand. Suspct early RA?    Order Specific Question:   Preferred imaging location?    Answer:   [facility]    Order Specific  Question:   Radiology Contrast Protocol - do NOT remove file path    Answer:   \\[email or web address]\DXFluoroContrastProtocols.pdf  . CBC  . COMPLETE METABOLIC PANEL WITH GFR  . Lipid Panel w/reflex Direct LDL  . Hemoglobin A1c  . Uric acid  . Cyclic citrul peptide antibody, IgG  . Rheumatoid factor  . Sedimentation rate  . ANA  . CK   Meds ordered this encounter  Medications  . fluticasone (FLONASE) 50 MCG/ACT nasal spray    Sig: Place 1 spray into both nostrils daily.    Dispense:  16 g    Refill:  2  . penicillin v potassium (VEETID) 500 MG tablet    Sig: Take 1 tablet (500 mg total) by mouth 4 (four) times daily.  Dispense:  60 tablet    Refill:  2  . propranolol ER (INDERAL LA) 80 MG 24 hr capsule    Sig: Take 1 capsule (80 mg total) by mouth daily. For tremor    Dispense:  90 capsule    Refill:  1     Discussed warning signs or symptoms. Please see discharge instructions. Patient expresses understanding.

## 2017-07-15 ENCOUNTER — Telehealth: Payer: Self-pay | Admitting: Family Medicine

## 2017-07-15 NOTE — Telephone Encounter (Signed)
Patient Forgot to tell Dr.Corey he is out of his depression medicine when he was here yesterday 07/14/17 Please send refill to [facility] in [place] and Let pt know on MY Chart when this is complete

## 2017-07-16 ENCOUNTER — Encounter: Payer: Self-pay | Admitting: Family Medicine

## 2017-07-16 LAB — COMPLETE METABOLIC PANEL WITH GFR
AG RATIO: 1.4 (calc) (ref 1.0–2.5)
ALBUMIN MSPROF: 4.2 g/dL (ref 3.6–5.1)
ALT: 31 U/L (ref 9–46)
AST: 23 U/L (ref 10–40)
Alkaline phosphatase (APISO): 88 U/L (ref 40–115)
BILIRUBIN TOTAL: 0.6 mg/dL (ref 0.2–1.2)
BUN: 22 mg/dL (ref 7–25)
CALCIUM: 9.5 mg/dL (ref 8.6–10.3)
CHLORIDE: 102 mmol/L (ref 98–110)
CO2: 24 mmol/L (ref 20–32)
Creat: 1.19 mg/dL (ref 0.60–1.35)
GFR, EST AFRICAN AMERICAN: 83 mL/min/{1.73_m2} (ref >=60)
GFR, EST NON AFRICAN AMERICAN: 71 mL/min/{1.73_m2} (ref >=60)
GLOBULIN: 3.1 g/dL (ref 1.9–3.7)
Glucose, Bld: 95 mg/dL (ref 65–99)
POTASSIUM: 4.4 mmol/L (ref 3.5–5.3)
Sodium: 137 mmol/L (ref 135–146)
Total Protein: 7.3 g/dL (ref 6.1–8.1)

## 2017-07-16 LAB — CBC
HEMATOCRIT: 45.3 % (ref 38.5–50.0)
HEMOGLOBIN: 15.8 g/dL (ref 13.2–17.1)
MCH: 30.6 pg (ref 27.0–33.0)
MCHC: 34.9 g/dL (ref 32.0–36.0)
MCV: 87.8 fL (ref 80.0–100.0)
MPV: 11.2 fL (ref 7.5–12.5)
PLATELETS: 299 10*3/uL (ref 140–400)
RBC: 5.16 10*6/uL (ref 4.20–5.80)
RDW: 12.9 % (ref 11.0–15.0)
WBC: 7.6 10*3/uL (ref 3.8–10.8)

## 2017-07-16 LAB — SEDIMENTATION RATE: Sed Rate: 11 mm/h (ref 0–15)

## 2017-07-16 LAB — CYCLIC CITRUL PEPTIDE ANTIBODY, IGG

## 2017-07-16 LAB — LIPID PANEL W/REFLEX DIRECT LDL
Cholesterol: 135 mg/dL (ref <200)
HDL: 43 mg/dL (ref >40)
LDL Cholesterol (Calc): 69 mg/dL (calc)
NON-HDL CHOLESTEROL (CALC): 92 mg/dL (ref <130)
TRIGLYCERIDES: 142 mg/dL (ref <150)
Total CHOL/HDL Ratio: 3.1 (calc) (ref <5.0)

## 2017-07-16 LAB — HEMOGLOBIN A1C
HEMOGLOBIN A1C: 5.6 %{Hb} (ref <5.7)
MEAN PLASMA GLUCOSE: 114 (calc)
eAG (mmol/L): 6.3 (calc)

## 2017-07-16 LAB — CK: Total CK: 45 U/L (ref 44–196)

## 2017-07-16 LAB — ANA: Anti Nuclear Antibody(ANA): NEGATIVE

## 2017-07-16 LAB — URIC ACID: URIC ACID, SERUM: 6.7 mg/dL (ref 4.0–8.0)

## 2017-07-16 LAB — RHEUMATOID FACTOR: Rhuematoid fact SerPl-aCnc: <14 IU/mL (ref <14)

## 2017-07-16 MED ORDER — CITALOPRAM HYDROBROMIDE 20 MG PO TABS: 20.0000 mg | ORAL_TABLET | Freq: Every day | ORAL | 1 refills | Status: DC

## 2017-07-16 MED FILL — CITALOPRAM HBR 20 MG TABLET: 20 | 90 days supply | Qty: 90 | Fill #0

## 2017-07-16 NOTE — Addendum Note (Signed)
Addended by: Rodolph Bong on: 07/16/2017 02:03 PM   Modules accepted: Orders

## 2017-07-16 NOTE — Telephone Encounter (Signed)
Celexa the medication most recently prescribed for depression was sent to pharmacy.

## 2017-07-16 NOTE — Telephone Encounter (Signed)
Patient daughter has been informed. Jenasia Dolinar,CMA  

## 2017-07-16 NOTE — Telephone Encounter (Signed)
Routing to PCP

## 2017-07-23 ENCOUNTER — Encounter: Payer: Self-pay | Admitting: Family Medicine

## 2017-07-26 ENCOUNTER — Ambulatory Visit: Payer: Medicaid Other | Admitting: Family Medicine

## 2017-08-12 MED FILL — ATORVASTATIN CALCIUM 40 MG: 40 | 30 days supply | Qty: 30 | Fill #4

## 2017-08-12 MED FILL — PROPRANOLOL HCL ER 60 MG CP: 60 | 30 days supply | Qty: 30 | Fill #4

## 2017-08-13 ENCOUNTER — Encounter: Payer: Self-pay | Admitting: Family Medicine

## 2017-08-13 MED FILL — MELOXICAM 15 MG TABLET: 15 | 30 days supply | Qty: 30 | Fill #0

## 2017-08-23 MED FILL — AMLODIPINE BESYLATE 10 MG T: 10 | 30 days supply | Qty: 30 | Fill #2

## 2017-08-23 MED FILL — AMITRIPTYLINE HCL 25 MG TAB: 25 | 30 days supply | Qty: 30 | Fill #2

## 2017-08-23 MED FILL — HYDROCHLOROTHIAZIDE 25 MG T: 25 | 30 days supply | Qty: 30 | Fill #2

## 2017-08-23 MED FILL — GABAPENTIN 300 MG CAPSULE: 300 | 30 days supply | Qty: 180 | Fill #3

## 2017-08-23 MED FILL — AMOX-CLAV 875-125 MG TABLET: 875-125 | 10 days supply | Qty: 20 | Fill #0

## 2017-08-23 MED FILL — HYDROCODON-APAP 5-325: 5-325 | 3 days supply | Qty: 12 | Fill #0

## 2017-08-23 MED FILL — LISINOPRIL 20 MG TABLET: 20 | 30 days supply | Qty: 30 | Fill #2

## 2017-08-23 MED FILL — METHOCARBAMOL 500 MG TABS: 500 | 30 days supply | Qty: 90 | Fill #2

## 2017-09-06 MED FILL — HYDROCODON-APAP 10-325: 10-325 | 5 days supply | Qty: 20 | Fill #0

## 2017-09-06 MED FILL — PENICILLIN VK 500 MG TABLET: 500 | 10 days supply | Qty: 40 | Fill #0

## 2017-09-16 MED FILL — PROPRANOLOL HCL ER 60 MG CP: 60 | 30 days supply | Qty: 30 | Fill #5

## 2017-09-16 MED FILL — ATORVASTATIN CALCIUM 40 MG: 40 | 30 days supply | Qty: 30 | Fill #5

## 2017-09-16 MED FILL — MELOXICAM 15 MG TABLET: 15 | 30 days supply | Qty: 30 | Fill #1

## 2017-09-20 ENCOUNTER — Ambulatory Visit: Payer: Medicaid Other | Admitting: Family Medicine

## 2017-09-20 ENCOUNTER — Encounter: Payer: Self-pay | Admitting: Family Medicine

## 2017-09-20 VITALS — BP 101/64 | HR 69 | Wt 250.0 lb

## 2017-09-20 DIAGNOSIS — I10 Essential (primary) hypertension: Secondary | ICD-10-CM

## 2017-09-20 DIAGNOSIS — M170 Bilateral primary osteoarthritis of knee: Secondary | ICD-10-CM | POA: Diagnosis not present

## 2017-09-20 DIAGNOSIS — G250 Essential tremor: Secondary | ICD-10-CM | POA: Diagnosis not present

## 2017-09-20 MED ORDER — PROPRANOLOL HCL ER 120 MG PO CP24: 120.0000 mg | ORAL_CAPSULE | Freq: Every day | ORAL | 1 refills | Status: DC

## 2017-09-20 NOTE — Progress Notes (Signed)
Davidson Palmieri is a 52 y.o. male who presents to [name]: Primary Care Sports Medicine today for knee pain, hypertension, essential tremor.   Kharee has knee pain thought to be due to DJD.  He has had trials of steroid injections which have not helped.  Orthovisc is not covered by Medicaid and he would like to proceed with trial of medication assistance program to cover Orthovisc injections.  He uses diclofenac gel which does help.  His pain does limit his activity.  Hypertension: Patient takes medications listed below including hydrochlorothiazide.  He denies chest pain palpitations shortness of breath lightheadedness or dizziness.  He checks his blood pressure at home and notes it is typically less than 120 systolic.  Tremor: Lonney has tremor thought to be benign essential tremor.  He was started on propranolol and increased to 80 mg in the last dose.  He notes the 80 mg of propranolol has helped control his tremor but not sufficiently.   ROS as above:  Exam:  BP 101/64   Pulse 69   Wt 250 lb (113.4 kg)   BMI 34.87 kg/m  Gen: Well NAD HEENT: EOMI,  MMM Lungs: Normal work of breathing. CTABL Heart: RRR no MRG Abd: NABS, Soft. Nondistended, Nontender Exts: Brisk capillary refill, warm and well perfused.  Neuro: Slight tremor with motion bilaterally.      Assessment and Plan: 52 y.o. male with  Knee pain DJD.  Plan for Orthovisc trial.  Will fill out form for medication assistance program.  Otherwise recheck 3 months.  Hypertension: Doing well.  As I am increasing propranolol will stop hydrochlorothiazide and recheck in 3 months.  Essential tremor: Improved but not fully controlled.  Plan to increase to 120 mg daily.   No orders of the defined types were placed in this encounter.  Meds ordered this encounter  Medications  . propranolol ER (INDERAL LA) 120 MG 24 hr capsule    Sig:  Take 1 capsule (120 mg total) by mouth daily. For tremor    Dispense:  90 capsule    Refill:  1     Historical information moved to improve visibility of documentation.  Past Medical History:  Diagnosis Date  . Gout   . Hypertension   . Kidney stones    Past Surgical History:  Procedure Laterality Date  . TONSILLECTOMY AND ADENOIDECTOMY     Social History   Tobacco Use  . Smoking status: Never Smoker  . Smokeless tobacco: Never Used  Substance Use Topics  . Alcohol use: No   family history is not on file.  Medications: Current Outpatient Medications  Medication Sig Dispense Refill  . amitriptyline (ELAVIL) 25 MG tablet Take 1 tablet (25 mg total) by mouth at bedtime as needed (pain). 90 tablet 1  . amLODipine (NORVASC) 10 MG tablet Take 1 tablet (10 mg total) by mouth daily. 90 tablet 1  . atorvastatin (LIPITOR) 40 MG tablet Take 1 tablet (40 mg total) by mouth daily. 90 tablet 1  . citalopram (CELEXA) 20 MG tablet Take 1 tablet (20 mg total) by mouth daily. 90 tablet 1  . diclofenac sodium (VOLTAREN) 1 % GEL Apply 4 g topically 4 (four) times daily. To affected joint. 100 g 11  . fluticasone (FLONASE) 50 MCG/ACT nasal spray Place 1 spray into both nostrils daily. 16 g 2  . gabapentin (NEURONTIN) 300 MG capsule Take 2 capsules (600 mg total) by mouth 3 (three) times daily. 540  capsule 1  . lisinopril (PRINIVIL,ZESTRIL) 20 MG tablet Take 1 tablet (20 mg total) by mouth daily. 90 tablet 1  . meloxicam (MOBIC) 15 MG tablet Take 1 tablet (15 mg total) by mouth daily. 90 tablet 0  . methocarbamol (ROBAXIN) 500 MG tablet Take 1 tablet (500 mg total) by mouth 3 (three) times daily. 270 tablet 2  . penicillin v potassium (VEETID) 500 MG tablet Take 1 tablet (500 mg total) by mouth 4 (four) times daily. 60 tablet 2  . propranolol ER (INDERAL LA) 120 MG 24 hr capsule Take 1 capsule (120 mg total) by mouth daily. For tremor 90 capsule 1   No current facility-administered medications  for this visit.    Allergies  Allergen Reactions  . Flexeril [Cyclobenzaprine] Hives and Rash     Discussed warning signs or symptoms. Please see discharge instructions. Patient expresses understanding.

## 2017-09-20 NOTE — Patient Instructions (Addendum)
Thank you for coming in today. We will try to get the gel shots approved.  Recheck with gel shots.  Continue current medicines.  Increase propranolol to 120mg  daily.  You can make medicine at night.  Hold off on the HCTZ for a few weeks.  Keep track of blood pressure.    If no shots recheck in 3 months.

## 2017-10-01 MED FILL — HYDROCODON-APAP 10-325: 10-325 | 5 days supply | Qty: 20 | Fill #0

## 2017-10-01 MED FILL — PENICILLIN VK 500 MG TABLET: 500 | 10 days supply | Qty: 40 | Fill #0

## 2017-10-07 ENCOUNTER — Ambulatory Visit: Payer: Self-pay | Admitting: Family Medicine

## 2017-10-14 MED FILL — AMLODIPINE BESYLATE 10 MG T: 10 | 30 days supply | Qty: 30 | Fill #3

## 2017-10-14 MED FILL — HYDROCHLOROTHIAZIDE 25 MG T: 25 | 30 days supply | Qty: 30 | Fill #3

## 2017-10-14 MED FILL — CITALOPRAM HBR 20 MG TABLET: 20 | 30 days supply | Qty: 30 | Fill #1

## 2017-10-14 MED FILL — LISINOPRIL 20 MG TABLET: 20 | 30 days supply | Qty: 30 | Fill #3

## 2017-10-19 ENCOUNTER — Other Ambulatory Visit: Payer: Self-pay | Admitting: Family Medicine

## 2017-10-19 MED ORDER — PENICILLIN V POTASSIUM 500 MG PO TABS: 500.0000 mg | ORAL_TABLET | Freq: Four times a day (QID) | ORAL | 2 refills | Status: DC

## 2017-10-19 MED ORDER — ATORVASTATIN CALCIUM 40 MG PO TABS: 40.0000 mg | ORAL_TABLET | Freq: Every day | ORAL | 1 refills | Status: DC

## 2017-10-19 NOTE — Telephone Encounter (Signed)
Routing to PCP for approval.

## 2017-10-19 NOTE — Telephone Encounter (Signed)
Pt called asking for refills on Lipitor and penicillin Pharmacy on file is correct.

## 2017-10-20 ENCOUNTER — Encounter (HOSPITAL_COMMUNITY): Payer: Self-pay

## 2017-10-20 ENCOUNTER — Emergency Department (HOSPITAL_COMMUNITY): Payer: Medicaid Other

## 2017-10-20 ENCOUNTER — Other Ambulatory Visit: Payer: Self-pay

## 2017-10-20 ENCOUNTER — Emergency Department (HOSPITAL_COMMUNITY)
Admission: EM | Admit: 2017-10-20 | Discharge: 2017-10-20 | Disposition: A | Discharge status: Home / Self Care | Payer: Medicaid Other | Attending: Emergency Medicine | Admitting: Emergency Medicine

## 2017-10-20 DIAGNOSIS — M25561 Pain in right knee: Secondary | ICD-10-CM | POA: Insufficient documentation

## 2017-10-20 DIAGNOSIS — M5489 Other dorsalgia: Secondary | ICD-10-CM | POA: Diagnosis not present

## 2017-10-20 DIAGNOSIS — M25562 Pain in left knee: Secondary | ICD-10-CM | POA: Insufficient documentation

## 2017-10-20 DIAGNOSIS — R4020 Unspecified coma: Secondary | ICD-10-CM | POA: Diagnosis not present

## 2017-10-20 DIAGNOSIS — M5442 Lumbago with sciatica, left side: Secondary | ICD-10-CM | POA: Insufficient documentation

## 2017-10-20 DIAGNOSIS — M5441 Lumbago with sciatica, right side: Secondary | ICD-10-CM | POA: Diagnosis not present

## 2017-10-20 DIAGNOSIS — R52 Pain, unspecified: Secondary | ICD-10-CM | POA: Diagnosis not present

## 2017-10-20 DIAGNOSIS — M545 Low back pain: Secondary | ICD-10-CM | POA: Diagnosis not present

## 2017-10-20 MED ORDER — LISINOPRIL 20 MG PO TABS
20.0000 mg | ORAL_TABLET | Freq: Once | ORAL | Status: DC
  Filled 2017-10-20: qty 1

## 2017-10-20 MED ORDER — MORPHINE SULFATE (PF) 4 MG/ML IV SOLN
4.0000 mg | Freq: Once | INTRAVENOUS | Status: AC
  Administered 2017-10-20: 4 mg via INTRAMUSCULAR
  Filled 2017-10-20: qty 1

## 2017-10-20 MED ORDER — METHOCARBAMOL 500 MG PO TABS: 500.0000 mg | ORAL_TABLET | Freq: Every evening | ORAL | 0 refills | Status: DC | PRN

## 2017-10-20 MED ORDER — MELOXICAM 15 MG PO TABS: 15.0000 mg | ORAL_TABLET | Freq: Every day | ORAL | 0 refills | Status: DC

## 2017-10-20 MED ORDER — LIDOCAINE 5 % EX PTCH: 1.0000 | MEDICATED_PATCH | CUTANEOUS | 0 refills | Status: DC

## 2017-10-20 MED ORDER — AMLODIPINE BESYLATE 5 MG PO TABS
10.0000 mg | ORAL_TABLET | Freq: Once | ORAL | Status: DC
  Filled 2017-10-20: qty 2

## 2017-10-20 MED ORDER — LIDOCAINE 5 % EX PTCH
1.0000 | MEDICATED_PATCH | CUTANEOUS | Status: DC
  Administered 2017-10-20: 1 via TRANSDERMAL
  Filled 2017-10-20: qty 1

## 2017-10-20 MED FILL — PENICILLIN VK 500 MG TABLET: 500 | 15 days supply | Qty: 60 | Fill #0

## 2017-10-20 MED FILL — METHOCARBAMOL 500 MG TABS: 500 | 20 days supply | Qty: 20 | Fill #0

## 2017-10-20 MED FILL — MELOXICAM 15 MG TABLET: 15 | 30 days supply | Qty: 30 | Fill #0

## 2017-10-20 MED FILL — ATORVASTATIN CALCIUM 40 MG: 40 | 30 days supply | Qty: 30 | Fill #0

## 2017-10-20 NOTE — Discharge Instructions (Addendum)
You were seen here today for Back Pain: Low back pain is discomfort in the lower back that may be due to injuries to muscles and ligaments around the spine. Occasionally, it may be caused by a problem to a part of the spine called a disc. Your back pain should be treated with medicines listed below as well as back exercises and this back pain should get better over the next 2 weeks. Most patients get completely well in 4 weeks. It is important to know however, if you develop severe or worsening pain, low back pain with fever, numbness, weakness or inability to walk or urinate, you should return to the ER immediately.  Please follow up with your doctor this week for a recheck if still having symptoms.  HOME INSTRUCTIONS Self - care:  The application of heat can help soothe the pain.  Maintaining your daily activities, including walking (this is encouraged), as it will help you get better faster than just staying in bed. Do not life, push, pull anything more than 10 pounds for the next week. I am attaching back exercises that you can do at home to help facilitate your recovery.   Back Exercises - I have attached a handout on back exercises that can be done at home to help facilitate your recovery.   Medications are also useful to help with pain control.   Acetaminophen.  This medication is generally safe, and found over the counter. Take as directed for your age. You should not take more than 8 of the extra strength (500mg ) pills a day (max dose is 4000mg  total OVER one day)  Non steroidal anti inflammatory: This includes medications including Ibuprofen, naproxen and Mobic; These medications help both pain and swelling and are very useful in treating back pain.  They should be taken with food, as they can cause stomach upset, and more seriously, stomach bleeding. Do not combine the medications.   Muscle relaxants:  These medications can help with muscle tightness that is a cause of lower back pain.  Most  of these medications can cause drowsiness, and it is not safe to drive or use dangerous machinery while taking them. They are primarily helpful when taken at night before sleep.  You will need to follow up with your Orthopedist in 1-2 weeks for reassessment and persistent symptoms.  Be aware that if you develop new symptoms, such as a fever, leg weakness, difficulty with or loss of control of your urine or bowels, abdominal pain, or more severe pain, you will need to seek medical attention and/or return to the Emergency department. Additional Information:  Your knee xrays showed arthritic changes. Please see attached handouts. .  Your vital signs today were: BP (!) 123/92    Pulse 64    Temp (!) 97.4 F (36.3 C) (Oral)    Resp 18    Ht 5\' 10"  (1.778 m)    Wt 110.7 kg    SpO2 98%    BMI 35.01 kg/m  If your blood pressure (BP) was elevated above 135/85 this visit, please have this repeated by your doctor within one month. ---------------

## 2017-10-20 NOTE — ED Provider Notes (Addendum)
[facility]-EMERGENCY DEPT Provider Note   CSN: [id number] Arrival date & time: 10/20/17  1255     History   Chief Complaint Chief Complaint  Patient presents with  . Knee Pain  . Back Pain    HPI William Vincent is a 52 y.o. male with a history of chronic back and knee pain, hypertension who presents emergency department today for back pain and knee pain.  Patient reports he has had pain in his back as well as bilateral knees for the last 2 and half years since injuring them at work.  He is followed by sports medicine doctor, Dr. Denyse Amassorey is prescribed chronic pain medication for his symptoms.  He reports today after driving the car for a period of time his back pain worsens in his bilateral lower back.  He also reports that he has been having pain in his knees and has been worse in the mornings upon awakening after standing for long period of time/ambulation.  He reports he has not taken anything for symptoms today.  He previously was on muscle relaxers, meloxicam, gabapentin and hydrocodone for his symptoms.  He reports that he is only taking hydrocodone for his symptoms now.  Patient is also noted to have elevated blood pressure during today's visit.  He reports he has not taken any of his for home blood pressure medications this morning.  He denies any chest pain, shortness of breath, or neurologic symptoms. Denies history of cancer, trauma, fever, night pain, IV drug use, recent spinal manipulation or procedures, upper back pain or neck pain, new numbness/tingling/weakness of the lower extremities, urinary retention, loss of bowel/bladder function, saddle anesthesia, or unexplained weight loss. Denies dysuria, flank pain, abdominal pain, frequency, urgency, or hematuria. Patient ambulates at baseline with a cane. He lives at home with his daughter.   HPI  Past Medical History:  Diagnosis Date  . Gout   . Hypertension   . Kidney stones     Patient Active Problem List   Diagnosis Date Noted  . Essential tremor 09/20/2017  . Transaminitis 03/23/2017  . Knee pain, bilateral 02/18/2017  . HTN (hypertension) 11/15/2015  . Trigger finger, left ring finger 11/15/2015  . Chronic back pain 08/21/2015  . Major depression 08/21/2015  . 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 TONSILLECTOMY AND ADENOIDECTOMY          Home Medications    Prior to Admission medications   Medication Sig Start Date End Date Taking? Authorizing Provider  amitriptyline (ELAVIL) 25 MG tablet Take 1 tablet (25 mg total) by mouth at bedtime as needed (pain). 02/18/17   Rodolph Bongorey, Evan S, MD  amLODipine (NORVASC) 10 MG tablet Take 1 tablet (10 mg total) by mouth daily. 03/23/17   Rodolph Bongorey, Evan S, MD  atorvastatin (LIPITOR) 40 MG tablet Take 1 tablet (40 mg total) by mouth daily. 10/19/17   Rodolph Bongorey, Evan S, MD  citalopram (CELEXA) 20 MG tablet Take 1 tablet (20 mg total) by mouth daily. 07/16/17   Rodolph Bongorey, Evan S, MD  diclofenac sodium (VOLTAREN) 1 % GEL Apply 4 g topically 4 (four) times daily. To affected joint. 02/18/17   Rodolph Bongorey, Evan S, MD  fluticasone (FLONASE) 50 MCG/ACT nasal spray Place 1 spray into both nostrils daily. 07/14/17   Rodolph Bongorey, Evan S, MD  gabapentin (NEURONTIN) 300 MG capsule Take 2 capsules (600 mg total) by mouth  3 (three) times daily. 02/18/17   Rodolph Bong, MD  lisinopril (PRINIVIL,ZESTRIL) 20 MG tablet Take 1 tablet (20 mg total) by mouth daily. 03/23/17   Rodolph Bong, MD  meloxicam (MOBIC) 15 MG tablet Take 1 tablet (15 mg total) by mouth daily. 07/12/17   Rodolph Bong, MD  methocarbamol (ROBAXIN) 500 MG tablet Take 1 tablet (500 mg total) by mouth 3 (three) times daily. 02/18/17   Rodolph Bong, MD  penicillin v potassium (VEETID) 500 MG tablet Take 1 tablet (500 mg total) by mouth 4 (four) times daily. 10/19/17   Rodolph Bong, MD  propranolol ER (INDERAL LA) 120 MG  24 hr capsule Take 1 capsule (120 mg total) by mouth daily. For tremor 09/20/17   Rodolph Bong, MD    Family History History reviewed. No pertinent family history.  Social History Social History   Tobacco Use  . Smoking status: Never Smoker  . Smokeless tobacco: Never Used  Substance Use Topics  . Alcohol use: No  . Drug use: No     Allergies   Flexeril [cyclobenzaprine]   Review of Systems Review of Systems  All other systems reviewed and are negative.    Physical Exam Updated Vital Signs BP (!) 157/118   Pulse 79   Temp (!) 97.4 F (36.3 C) (Oral)   Resp 16   Ht 5\' 10"  (1.778 m)   Wt 110.7 kg   SpO2 98%   BMI 35.01 kg/m   Physical Exam  Constitutional: He appears well-developed and well-nourished. No distress.  Non-toxic appearing  HENT:  Head: Normocephalic and atraumatic.  Right Ear: External ear normal.  Left Ear: External ear normal.  Neck: Normal range of motion. Neck supple. No spinous process tenderness present. No neck rigidity. Normal range of motion present.  Cardiovascular: Normal rate, regular rhythm, normal heart sounds and intact distal pulses.  No murmur heard. Pulses:      Radial pulses are 2+ on the right side, and 2+ on the left side.       Femoral pulses are 2+ on the right side, and 2+ on the left side.      Dorsalis pedis pulses are 2+ on the right side, and 2+ on the left side.       Posterior tibial pulses are 2+ on the right side, and 2+ on the left side.  Pulmonary/Chest: Effort normal and breath sounds normal. No respiratory distress.  Abdominal: Soft. Bowel sounds are normal. He exhibits no pulsatile midline mass. There is no tenderness. There is no rigidity, no rebound and no CVA tenderness.  Musculoskeletal:       Right hip: Normal.       Left hip: Normal.       Right knee: He exhibits normal range of motion, no swelling, no ecchymosis, no deformity and no erythema. Tenderness found.       Left knee: He exhibits normal range  of motion, no swelling, no effusion, no laceration and no erythema. Tenderness found.       Right ankle: Normal.       Left ankle: Normal.  Posterior and appearance appears normal. No evidence of obvious scoliosis or kyphosis. No obvious signs of skin changes, trauma, deformity, infection. No C, T, or L spine tenderness or step-offs to palpation. No C, T paraspinal tenderness. Diffuse paraspinal tenderness of the lumbar spine. He is also noted to have tenderness of the gluteus medius on exam. No hip or ankle tenderness  to palpation. Lung expansion normal. Bilateral lower extremity strength 5 out of 5 including plantar flexion, dorsiflexion, hip flexion, and extensor hallucis longus. Patellar and Achilles deep tendon reflex 2+ and equal bilaterally. Sensation of lower extremities grossly intact. Gait able but patient notes painful (uses cane). Lower extremity compartments soft. PT and DP 2+ b/l. Cap refill <2 seconds.   Neurological: He is alert. He has normal strength. No sensory deficit.  Reflex Scores:      Patellar reflexes are 2+ on the right side and 2+ on the left side. No foot drop  Skin: Skin is warm, dry and intact. Capillary refill takes less than 2 seconds. No rash noted. He is not diaphoretic. No erythema.  No vesicular-like rash noted. No joint swelling, overlying erythema or heat over joints.   Nursing note and vitals reviewed.    ED Treatments / Results  Labs (all labs ordered are listed, but only abnormal results are displayed) Labs Reviewed - No data to display  EKG None  Radiology Dg Knee Complete 4 Views Left  Result Date: 10/20/2017 CLINICAL DATA:  Initial evaluation for acute bilateral knee pain. EXAM: LEFT KNEE - COMPLETE 4+ VIEW COMPARISON:  Prior radiograph from 02/18/2017. FINDINGS: No acute fracture or dislocation. No joint effusion. Moderate tricompartmental degenerative osteoarthritic changes, greatest within the medial femorotibial joint space compartment. No  soft tissue abnormality. Osseous mineralization normal. IMPRESSION: 1. No acute osseous abnormality about the knee. 2. Mild tricompartmental degenerative osteoarthrosis, similar to previous. Electronically Signed   By: Rise Mu M.D.   On: 10/20/2017 15:30   Dg Knee Complete 4 Views Right  Result Date: 10/20/2017 CLINICAL DATA:  Initial evaluation for acute knee pain. EXAM: RIGHT KNEE - COMPLETE 4+ VIEW COMPARISON:  Prior radiograph from 02/18/2017. FINDINGS: No acute fracture or dislocation. No joint effusion. Mild tricompartmental degenerative osteoarthritic changes, greatest within the medial femorotibial joint space compartment. Overall, changes are more severe within the right knee as compared to the left. No acute soft tissue abnormality. Osseous mineralization normal. IMPRESSION: 1. No acute osseous abnormality. 2. Mild tricompartmental degenerative osteoarthrosis, similar to previous. Overall, osteoarthritic changes are more severe within the right knee is compared to the left. Electronically Signed   By: Rise Mu M.D.   On: 10/20/2017 15:32    Procedures Procedures (including critical care time)  Medications Ordered in ED Medications  lidocaine (LIDODERM) 5 % 1 patch (1 patch Transdermal Patch Applied 10/20/17 1627)  morphine 4 MG/ML injection 4 mg (4 mg Intramuscular Given 10/20/17 1625)     Initial Impression / Assessment and Plan / ED Course  I have reviewed the triage vital signs and the nursing notes.  Pertinent labs & imaging results that were available during my care of the patient were reviewed by me and considered in my medical decision making (see chart for details).     53 y.o. male with back pain.  No neurological deficits and normal neuro exam.  Patient can walk but states is painful.  No loss of bowel or bladder control.  No concern for cauda equina.  No fever, night sweats, weight loss, h/o cancer, IVDU. Will treat the patient with back exercises,  activity modification, muscle relaxers, NSAIDs (*no history of kidney disease, most recent Cr wnl and no hx of GI bleed).   Patient also with lateral knee pain.  No trauma.  Exam is not consistent with septic joint.  No concern for gout at this time.  No concern for DVT.  X-ray with  arthritis.  Pain managed in the department.  Patient is to follow-up with PCP versus orthopedics.  Strict return precautions discussed.  Patient appears safe for discharge.  Patient was noted to have an elevated blood pressure during their stay in the emergency department. The patient has a history of high blood pressure. Denies chest pain or sob. No neurologic symptoms. They state they have not taken their medication for this today. I suspect the patients blood pressure is moderately elevated due to pain. BP improved after pain medication. I advised the patient to discuss this with their PCP during her follow up visit to decide if medication management is needed for this. He is to take his blood pressure medication when he goes home today.   Final Clinical Impressions(s) / ED Diagnoses   Final diagnoses:  Acute bilateral low back pain, with sciatica presence unspecified  Acute pain of both knees    ED Discharge Orders         Ordered    meloxicam (MOBIC) 15 MG tablet  Daily     10/20/17 1627    lidocaine (LIDODERM) 5 %  Every 24 hours     10/20/17 1627    methocarbamol (ROBAXIN) 500 MG tablet  At bedtime PRN     10/20/17 1627           Jacinto HalimMaczis, Shirin Echeverry M, PA-C 10/20/17 1652    Mahiya Kercheval, Elmer SowMichael M, PA-C 10/20/17 1653    Gwyneth SproutPlunkett, Whitney, MD 10/21/17 [phone]

## 2017-10-20 NOTE — ED Triage Notes (Signed)
Per EMS:  Pt injured back 2.5 years ago.  No injuries today.  Pt c/o of increased knee and back pain after driving for an hour, pt has not taken home pain meds today (he takes them at night).  Pt hx of HTN and depression.

## 2017-10-21 ENCOUNTER — Telehealth: Payer: Self-pay | Admitting: Family Medicine

## 2017-10-21 NOTE — Telephone Encounter (Signed)
Pt's daughter, Lindie SpruceMeghan called.  Pt had to go to ER  because of severe pain in knees -  he was told he has arthritic changes in his knees his bp was also low because of the pain. He was told to be seen asap by his Doc - no appointments available right away with  with Dr C.

## 2017-10-21 NOTE — Telephone Encounter (Signed)
Thank you, patient has been scheduled.

## 2017-10-21 NOTE — Telephone Encounter (Signed)
Please schedule per Dr Denyse Amassorey in near future

## 2017-10-21 NOTE — Telephone Encounter (Signed)
Please schedule appointment with me in the near future.  Blood pressure was a little bit high because of pain not low.

## 2017-10-26 MED FILL — PROPRANOLOL ER 120 MG CAP: 120 | 30 days supply | Qty: 30 | Fill #0

## 2017-10-27 ENCOUNTER — Ambulatory Visit (INDEPENDENT_AMBULATORY_CARE_PROVIDER_SITE_OTHER): Payer: Medicaid Other

## 2017-10-27 ENCOUNTER — Ambulatory Visit (INDEPENDENT_AMBULATORY_CARE_PROVIDER_SITE_OTHER): Payer: Medicaid Other | Admitting: Orthopaedic Surgery

## 2017-10-27 ENCOUNTER — Encounter (INDEPENDENT_AMBULATORY_CARE_PROVIDER_SITE_OTHER): Payer: Self-pay | Admitting: Orthopaedic Surgery

## 2017-10-27 VITALS — Ht 71.0 in | Wt 246.0 lb

## 2017-10-27 DIAGNOSIS — M5441 Lumbago with sciatica, right side: Secondary | ICD-10-CM

## 2017-10-27 DIAGNOSIS — M5442 Lumbago with sciatica, left side: Secondary | ICD-10-CM | POA: Diagnosis not present

## 2017-10-27 DIAGNOSIS — G8929 Other chronic pain: Secondary | ICD-10-CM

## 2017-10-27 MED ORDER — PREDNISONE 10 MG (21) PO TBPK: ORAL_TABLET | ORAL | 0 refills | Status: DC

## 2017-10-27 MED ORDER — TIZANIDINE HCL 2 MG PO CAPS: 2.0000 mg | ORAL_CAPSULE | Freq: Three times a day (TID) | ORAL | 0 refills | Status: DC | PRN

## 2017-10-27 MED FILL — tiZANidine HCL 2 MG TABS: 2 | 10 days supply | Qty: 30 | Fill #0

## 2017-10-27 MED FILL — predniSONE 10 MG TABS: 10 | 6 days supply | Qty: 21 | Fill #0

## 2017-10-27 NOTE — Progress Notes (Signed)
Office Visit Note   Patient: William Vincent           Date of Birth: 11-03-1965           MRN: [id number] Visit Date: 10/27/2017              Requested by: Rodolph Bongorey, Evan S, MD [address] [place], [place] [id number] PCP: Rodolph Bongorey, Evan S, MD   Assessment & Plan: Visit Diagnoses:  1. Chronic bilateral low back pain with bilateral sciatica     Plan: Impression is lumbar radiculopathy.  Because the patient has failed conservative treatment to include over-the-counter medications and formal physical therapy over the past 2-1/2 years and has associated weakness to bilateral lower extremities, feel it is appropriate to proceed with an MRI to further assess this.  I will go ahead and call in a Sterapred taper as well as Zanaflex.  He is currently on Robaxin without relief of symptoms.  He will stop the Robaxin while on Zanaflex.  He will follow-up with us to discuss results of the MRI.  Follow-Up Instructions: Return in about 10 days (around 11/06/2017).   Orders:  Orders Placed This Encounter  Procedures  . XR Lumbar Spine 2-3 Views   Meds ordered this encounter  Medications  . tizanidine (ZANAFLEX) 2 MG capsule    Sig: Take 1 capsule (2 mg total) by mouth 3 (three) times daily as needed for muscle spasms.    Dispense:  30 capsule    Refill:  0  . predniSONE (STERAPRED UNI-PAK 21 TAB) 10 MG (21) TBPK tablet    Sig: Take as directed    Dispense:  21 tablet    Refill:  0      Procedures: No procedures performed   Clinical Data: No additional findings.   Subjective: Chief Complaint  Patient presents with  . Lower Back - Pain  . Left Knee - Pain  . Right Knee - Pain    HPI patient is a pleasant 52 year old gentleman who presents to our clinic today with lower back pain radiating down both lower extremities to the knees.  This is been ongoing for the past 2-1/2 years following an accident when he lifted a very heavy box of rib Oz over his head while at work.  He was  seen by his doctor shortly after this happened where he was worked up.  This showed 6 broken vertebrae.  He has tried formal physical therapy and over-the-counter medications without relief of symptoms.  No previous epidural steroid injection.  Due to the pain in his knees, his PCP injected both knees with cortisone.  The patient did not get any relief even during the anesthetic phase.  His back and leg pain has significantly worsened and he has started to get associated weakness.  The pain he has is constant worse when he is in a seated position.  He notes constant soreness to his buttocks.  Nothing seems to relieve his symptoms.  He also notes constant numbness to all 10 toes.  No bowel or bladder change and no saddle paresthesias.  Of note, he has to ambulate with the use of a walker due to his bilateral lower extremity weakness.  Review of Systems as detailed in HPI.  All others reviewed and are negative.   Objective: Vital Signs: Ht 5\' 11"  (1.803 m)   Wt 246 lb (111.6 kg)   BMI 34.31 kg/m   Physical Exam well-developed and well-nourished gentleman no acute distress.  Alert and oriented x3.  Ortho Exam well-developed and well-nourished gentleman no acute distress.  Alert and oriented x3.  He has decreased strength to bilateral lower extremities.  Positive logroll on the right negative on the left.  Positive straight leg raise both sides.  Increased pain with lumbar flexion and extension.  EHL and FHL intact.  Decreased sensation to both feet.  Specialty Comments:  No specialty comments available.  Imaging: Xr Lumbar Spine 2-3 Views  Result Date: 10/27/2017 No acute or structural abnormalities.  Lumbar spondylosis    PMFS History: Patient Active Problem List   Diagnosis Date Noted  . Essential tremor 09/20/2017  . Transaminitis 03/23/2017  . Knee pain, bilateral 02/18/2017  . HTN (hypertension) 11/15/2015  . Trigger finger, left ring finger 11/15/2015  . Chronic back pain  08/21/2015  . Major depression 08/21/2015  . 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 Gout   . Hypertension   . Kidney stones     History reviewed. No pertinent family history.  Past Surgical History:  Procedure Laterality Date  . TONSILLECTOMY AND ADENOIDECTOMY     Social History   Occupational History  . Not on file  Tobacco Use  . Smoking status: Never Smoker  . Smokeless tobacco: Never Used  Substance and Sexual Activity  . Alcohol use: No  . Drug use: No  . Sexual activity: Not on file

## 2017-10-27 NOTE — Addendum Note (Signed)
Addended by: Elizebeth BrookingJACKSON, Nisreen Guise L on: 10/27/2017 04:48 PM   Modules accepted: Orders

## 2017-10-28 ENCOUNTER — Encounter: Payer: Self-pay | Admitting: Family Medicine

## 2017-10-28 ENCOUNTER — Ambulatory Visit: Payer: Medicaid Other | Admitting: Family Medicine

## 2017-10-28 VITALS — BP 115/80 | HR 112 | Wt 216.0 lb

## 2017-10-28 DIAGNOSIS — F321 Major depressive disorder, single episode, moderate: Secondary | ICD-10-CM | POA: Diagnosis not present

## 2017-10-28 DIAGNOSIS — M5416 Radiculopathy, lumbar region: Secondary | ICD-10-CM

## 2017-10-28 MED ORDER — CITALOPRAM HYDROBROMIDE 40 MG PO TABS: 40.0000 mg | ORAL_TABLET | Freq: Every day | ORAL | 1 refills | Status: DC

## 2017-10-28 MED ORDER — HYDROCODONE-ACETAMINOPHEN 5-325 MG PO TABS
1.0000 | ORAL_TABLET | Freq: Four times a day (QID) | ORAL | 0 refills | Status: DC | PRN
Start: 2017-10-28 — End: 2017-12-21

## 2017-10-28 MED ORDER — HYDROCODONE-ACETAMINOPHEN 5-325 MG PO TABS
1.0000 | ORAL_TABLET | Freq: Four times a day (QID) | ORAL | 0 refills | Status: DC | PRN
Start: 2017-10-28 — End: 2017-10-28

## 2017-10-28 MED FILL — HYDROCODON-APAP 5-325: 5-325 | 4 days supply | Qty: 15 | Fill #0

## 2017-10-28 NOTE — Addendum Note (Signed)
Addended by: Rodolph BongOREY, Makenzie Vittorio S on: 10/28/2017 03:46 PM   Modules accepted: Orders

## 2017-10-28 NOTE — Patient Instructions (Signed)
Thank you for coming in today. Take norco for severe pain.  Take prednisone as prescribed.  Increase celexa.  Keep me updated.   Come back or go to the emergency room if you notice new weakness new numbness problems walking or bowel or bladder problems.   Sciatica Sciatica is pain, numbness, weakness, or tingling along your sciatic nerve. The sciatic nerve starts in the lower back and goes down the back of each leg. Sciatica happens when this nerve is pinched or has pressure put on it. Sciatica usually goes away on its own or with treatment. Sometimes, sciatica may keep coming back (recur). Follow these instructions at home: Medicines  Take over-the-counter and prescription medicines only as told by your doctor.  Do not drive or use heavy machinery while taking prescription pain medicine. Managing pain  If directed, put ice on the affected area. ? Put ice in a plastic bag. ? Place a towel between your skin and the bag. ? Leave the ice on for 20 minutes, 2-3 times a day.  After icing, apply heat to the affected area before you exercise or as often as told by your doctor. Use the heat source that your doctor tells you to use, such as a moist heat pack or a heating pad. ? Place a towel between your skin and the heat source. ? Leave the heat on for 20-30 minutes. ? Remove the heat if your skin turns bright red. This is especially important if you are unable to feel pain, heat, or cold. You may have a greater risk of getting burned. Activity  Return to your normal activities as told by your doctor. Ask your doctor what activities are safe for you. ? Avoid activities that make your sciatica worse.  Take short rests during the day. Rest in a lying or standing position. This is usually better than sitting to rest. ? When you rest for a long time, do some physical activity or stretching between periods of rest. ? Avoid sitting for a long time without moving. Get up and move around at least  one time each hour.  Exercise and stretch regularly, as told by your doctor.  Do not lift anything that is heavier than 10 lb (4.5 kg) while you have symptoms of sciatica. ? Avoid lifting heavy things even when you do not have symptoms. ? Avoid lifting heavy things over and over.  When you lift objects, always lift in a way that is safe for your body. To do this, you should: ? Bend your knees. ? Keep the object close to your body. ? Avoid twisting. General instructions  Use good posture. ? Avoid leaning forward when you are sitting. ? Avoid hunching over when you are standing.  Stay at a healthy weight.  Wear comfortable shoes that support your feet. Avoid wearing high heels.  Avoid sleeping on a mattress that is too soft or too hard. You might have less pain if you sleep on a mattress that is firm enough to support your back.  Keep all follow-up visits as told by your doctor. This is important. Contact a doctor if:  You have pain that: ? Wakes you up when you are sleeping. ? Gets worse when you lie down. ? Is worse than the pain you have had in the past. ? Lasts longer than 4 weeks.  You lose weight for without trying. Get help right away if:  You cannot control when you pee (urinate) or poop (have a bowel movement).  You have weakness in any of these areas and it gets worse. ? Lower back. ? Lower belly (pelvis). ? Butt (buttocks). ? Legs.  You have redness or swelling of your back.  You have a burning feeling when you pee. This information is not intended to replace advice given to you by your health care provider. Make sure you discuss any questions you have with your health care provider. Document Released: 12/03/2007 Document Revised: 08/01/2015 Document Reviewed: 11/02/2014 Elsevier Interactive Patient Education  [employer].

## 2017-10-28 NOTE — Progress Notes (Addendum)
William MouldDannie Vincent is a 52 y.o. male who [facility] today for back pain and radiculopathy.   [name] has a history of chronic low back pain with some radiating pain for years.  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  He was in his normal state of health with moderate pain until last week when his pain suddenly worsened.  He presented to the emergency room where x-rays of his knees showed DJD.  He was referred to orthopedic surgeon who he saw yesterday.  And orthopedics x-ray of his lumbar spine was not acutely abnormal.  He did however have lower extremity weakness and bilateral lumbar radicular pain.  He was prescribed prednisone which she has not yet started taking and Zanaflex.  MRI was ordered but patient has not been called to schedule yet.  He notes worsening pain radiating down the backs of both legs to his feet bilaterally.  He notes lower extremity weakness and difficulty walking.  He is using a walker now to ambulate.  He denies any bowel or bladder incontinence.  Pain is severe and significantly different from prior chronic pain.  Patient notes that his depression symptoms have worsened as well with his current pain episode.  He takes citalopram 20 mg daily.  He denies SI or HI active symptoms.  ROS:  As above  Exam:  BP 115/80   Pulse (!) 112   Wt 216 lb (98 kg)   BMI 30.13 kg/m  General: Well Developed, well nourished, and in no acute distress.  Neuro/Psych: Alert and oriented x3, extra-ocular muscles intact, able to move all 4 extremities, sensation grossly intact.  Affect is tearful.  No SI or HI. Skin: Warm and dry, no rashes noted.  Respiratory: Not using accessory muscles, speaking in full sentences, trachea midline.  Cardiovascular: Pulses palpable, no extremity edema. Abdomen: Does not appear distended. MSK:  L-spine: Nontender to lumbar spine. Significantly limited lumbar motion especially with flexion due to  rib reproducible lower extremity radiating pain.  Lower extremity strength is decreased. Hip flexion 4/5 left, 4/5 right Hip abduction 4/5 left, 4/5 right Hip adduction 4/5 left, 4/5 right Knee extension 3+/5 left, 4/5 right Knee flexion 3/5 left, 3/5 right Ankle dorsiflexion, 2/5 left, 2/5 right Ankle plantarflexion 4/5 left, 4/5 right  Patient has a significantly positive bilateral straight leg raise test.  Sensation is intact bilateral lower extremities.  Extremely antalgic gait.  Patient uses a walker to ambulate.  Lab and Radiology Results No results found for this or any previous visit (from the past 72 hour(s)). Xr Lumbar Spine 2-3 Views  Result Date: 10/27/2017 No acute or structural abnormalities.  Lumbar spondylosis  I personally (independently) visualized and performed the interpretation of the images attached in this note.     Assessment and Plan: 52 y.o. male with bilateral lower extremity radiating pain.  This is concern for acutely worsening lumbar radicular pain.  I am concerned for large herniated disc and possibly early developing cauda equina syndrome.  Plan for MRI ASAP.  Patient symptoms have worsened.  Will order MRI stat and work on prior authorization now.  He was previously prescribed prednisone and Zanaflex.  Additionally will prescribe limited Norco.  Mood worsening.  Increase Celexa to 40 mg daily.  Recheck in the near future.  No orders of the defined types were placed in this encounter.  Meds ordered this encounter  Medications  . DISCONTD: HYDROcodone-acetaminophen (NORCO/VICODIN) 5-325 MG tablet    Sig:  Take 1 tablet by mouth every 6 (six) hours as needed.    Dispense:  15 tablet    Refill:  0  . citalopram (CELEXA) 40 MG tablet    Sig: Take 1 tablet (40 mg total) by mouth daily.    Dispense:  90 tablet    Refill:  1  . HYDROcodone-acetaminophen (NORCO/VICODIN) 5-325 MG tablet    Sig: Take 1 tablet by mouth every 6 (six) hours as needed.     Dispense:  15 tablet    Refill:  0    Historical information moved to improve visibility of documentation.  Past Medical History:  Diagnosis Date  . Gout   . Hypertension   . Kidney stones    Past Surgical History:  Procedure Laterality Date  . TONSILLECTOMY AND ADENOIDECTOMY     Social History   Tobacco Use  . Smoking status: Never Smoker  . Smokeless tobacco: Never Used  Substance Use Topics  . Alcohol use: No   family history is not on file.  Medications: Current Outpatient Medications  Medication Sig Dispense Refill  . amitriptyline (ELAVIL) 25 MG tablet Take 1 tablet (25 mg total) by mouth at bedtime as needed (pain). 90 tablet 1  . amLODipine (NORVASC) 10 MG tablet Take 1 tablet (10 mg total) by mouth daily. 90 tablet 1  . atorvastatin (LIPITOR) 40 MG tablet Take 1 tablet (40 mg total) by mouth daily. 90 tablet 1  . citalopram (CELEXA) 40 MG tablet Take 1 tablet (40 mg total) by mouth daily. 90 tablet 1  . diclofenac sodium (VOLTAREN) 1 % GEL Apply 4 g topically 4 (four) times daily. To affected joint. 100 g 11  . fluticasone (FLONASE) 50 MCG/ACT nasal spray Place 1 spray into both nostrils daily. 16 g 2  . gabapentin (NEURONTIN) 300 MG capsule Take 2 capsules (600 mg total) by mouth 3 (three) times daily. 540 capsule 1  . hydrochlorothiazide (HYDRODIURIL) 25 MG tablet Take 25 mg by mouth daily.  1  . lidocaine (LIDODERM) 5 % Place 1 patch onto the skin daily. Remove & Discard patch within 12 hours or as directed by MD 30 patch 0  . lisinopril (PRINIVIL,ZESTRIL) 20 MG tablet Take 1 tablet (20 mg total) by mouth daily. 90 tablet 1  . meloxicam (MOBIC) 15 MG tablet Take 1 tablet (15 mg total) by mouth daily. 30 tablet 0  . methocarbamol (ROBAXIN) 500 MG tablet Take 1 tablet (500 mg total) by mouth at bedtime as needed for muscle spasms. 20 tablet 0  . penicillin v potassium (VEETID) 500 MG tablet Take 1 tablet (500 mg total) by mouth 4 (four) times daily. 60 tablet 2    . predniSONE (STERAPRED UNI-PAK 21 TAB) 10 MG (21) TBPK tablet Take as directed 21 tablet 0  . propranolol ER (INDERAL LA) 120 MG 24 hr capsule Take 1 capsule (120 mg total) by mouth daily. For tremor 90 capsule 1  . propranolol ER (INDERAL LA) 60 MG 24 hr capsule Take 60 mg by mouth daily.   1  . tizanidine (ZANAFLEX) 2 MG capsule Take 1 capsule (2 mg total) by mouth 3 (three) times daily as needed for muscle spasms. 30 capsule 0  . HYDROcodone-acetaminophen (NORCO/VICODIN) 5-325 MG tablet Take 1 tablet by mouth every 6 (six) hours as needed. 15 tablet 0   No current facility-administered medications for this visit.    Allergies  Allergen Reactions  . Flexeril [Cyclobenzaprine] Hives and Rash  Discussed warning signs or symptoms. Please see discharge instructions. Patient expresses understanding.

## 2017-11-01 ENCOUNTER — Other Ambulatory Visit: Payer: Self-pay

## 2017-11-01 ENCOUNTER — Ambulatory Visit (INDEPENDENT_AMBULATORY_CARE_PROVIDER_SITE_OTHER): Payer: Medicaid Other

## 2017-11-01 DIAGNOSIS — M545 Low back pain: Secondary | ICD-10-CM | POA: Diagnosis not present

## 2017-11-01 DIAGNOSIS — M5416 Radiculopathy, lumbar region: Secondary | ICD-10-CM

## 2017-11-01 DIAGNOSIS — M4317 Spondylolisthesis, lumbosacral region: Secondary | ICD-10-CM | POA: Diagnosis not present

## 2017-11-04 ENCOUNTER — Encounter (INDEPENDENT_AMBULATORY_CARE_PROVIDER_SITE_OTHER): Payer: Self-pay | Admitting: Orthopaedic Surgery

## 2017-11-04 ENCOUNTER — Ambulatory Visit (INDEPENDENT_AMBULATORY_CARE_PROVIDER_SITE_OTHER): Payer: Self-pay

## 2017-11-04 ENCOUNTER — Ambulatory Visit (INDEPENDENT_AMBULATORY_CARE_PROVIDER_SITE_OTHER): Payer: Medicaid Other | Admitting: Physician Assistant

## 2017-11-04 VITALS — BP 112/71 | HR 81

## 2017-11-04 DIAGNOSIS — M5416 Radiculopathy, lumbar region: Secondary | ICD-10-CM

## 2017-11-04 DIAGNOSIS — M545 Low back pain: Secondary | ICD-10-CM

## 2017-11-04 DIAGNOSIS — G8929 Other chronic pain: Secondary | ICD-10-CM | POA: Diagnosis not present

## 2017-11-04 MED ORDER — METHYLPREDNISOLONE ACETATE 80 MG/ML IJ SUSP
80.0000 mg | Freq: Once | INTRAMUSCULAR | Status: AC
  Administered 2017-11-04: 80 mg

## 2017-11-04 NOTE — Procedures (Signed)
Lumbar Epidural Steroid Injection - Interlaminar Approach with Fluoroscopic Guidance  Patient: William Vincent      Date of Birth: 21-Jan-1966 MRN: [id number] PCP: Rodolph Bongorey, Evan S, MD      Visit Date: 11/04/2017   Universal Protocol:     Consent Given By: the patient  Position: PRONE  Additional Comments: Vital signs were monitored before and after the procedure. Patient was prepped and draped in the usual sterile fashion. The correct patient, procedure, and site was verified.   Injection Procedure Details:  Procedure Site One Meds Administered:  Meds ordered this encounter  Medications  . methylPREDNISolone acetate (DEPO-MEDROL) injection 80 mg     Laterality: Bilateral  Location/Site:  L5-S1  Needle size: 20 G  Needle type: Tuohy  Needle Placement: Paramedian epidural  Findings:   -Comments: Excellent flow of contrast into the epidural space.  Procedure Details: Using a paramedian approach from the side mentioned above, the region overlying the inferior lamina was localized under fluoroscopic visualization and the soft tissues overlying this structure were infiltrated with 4 ml. of 1% Lidocaine without Epinephrine. The Tuohy needle was inserted into the epidural space using a paramedian approach.   The epidural space was localized using loss of resistance along with lateral and bi-planar fluoroscopic views.  After negative aspirate for air, blood, and CSF, a 2 ml. volume of Isovue-250 was injected into the epidural space and the flow of contrast was observed. Radiographs were obtained for documentation purposes.    The injectate was administered into the level noted above.   Additional Comments:  The patient tolerated the procedure well Dressing: Band-Aid    Post-procedure details: Patient was observed during the procedure. Post-procedure instructions were reviewed.  Patient left the clinic in stable condition.

## 2017-11-04 NOTE — Addendum Note (Signed)
Addended by: Ashok NorrisNEWTON, Kamareon Sciandra K on: 11/04/2017 08:25 AM   Modules accepted: Orders, SmartSet

## 2017-11-04 NOTE — Progress Notes (Addendum)
      Patient: Titus MouldDannie Fischbach           Date of Birth: 1965-05-15           MRN: [id number] Visit Date: 11/04/2017 PCP: Rodolph Bongorey, Evan S, MD   Assessment & Plan:  Chief Complaint:  Chief Complaint  Patient presents with  . Lower Back - Follow-up, Pain   Visit Diagnoses:  1. Chronic bilateral low back pain, with sciatica presence unspecified   2. Lumbar radiculopathy     Plan: Patient is a pleasant 52 year old gentleman who presents to our clinic today to discuss MRI results of his lumbar spine.  2-1/2 years of back pain which was acutely exacerbated just a few weeks back.  He started to note significant pain and weakness.  Stat MRI ordered of the lumbar spine which showed a small disc protrusion at L5-S1 without impingement.  He has had continued back pain despite being on muscle relaxers and a steroid taper.  Still no bowel or bladder change and no saddle paresthesias.  At this point, we will refer the patient to Dr. Alvester MorinNewton for an epidural steroid injection.  He will follow-up with us in 4 weeks time for recheck and to send him to formal physical therapy.  Call if concerns or questions in the meantime.  Follow-Up Instructions: Return in about 4 weeks (around 12/02/2017).   Orders:  Orders Placed This Encounter  Procedures  . XR C-ARM NO REPORT  . Epidural Steroid injection   Meds ordered this encounter  Medications  . methylPREDNISolone acetate (DEPO-MEDROL) injection 80 mg    Imaging: No results found.  PMFS History: Patient Active Problem List   Diagnosis Date Noted  . Essential tremor 09/20/2017  . Transaminitis 03/23/2017  . Knee pain, bilateral 02/18/2017  . HTN (hypertension) 11/15/2015  . Trigger finger, left ring finger 11/15/2015  . Chronic back pain 08/21/2015  . Major depression 08/21/2015  . 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 Chronic bilateral low back pain 07/24/2015  . Pars defect of lumbar spine 07/24/2015   Past Medical History:    Diagnosis Date  . Gout   . Hypertension   . Kidney stones     History reviewed. No pertinent family history.  Past Surgical History:  Procedure Laterality Date  . TONSILLECTOMY AND ADENOIDECTOMY     Social History   Occupational History  . Not on file  Tobacco Use  . Smoking status: Never Smoker  . Smokeless tobacco: Never Used  Substance and Sexual Activity  . Alcohol use: No  . Drug use: No  . Sexual activity: Not on file

## 2017-11-05 ENCOUNTER — Ambulatory Visit (INDEPENDENT_AMBULATORY_CARE_PROVIDER_SITE_OTHER): Payer: Medicaid Other | Admitting: Orthopaedic Surgery

## 2017-11-18 MED FILL — AMITRIPTYLINE HCL 25 MG TAB: 25 | 30 days supply | Qty: 30 | Fill #3

## 2017-11-18 MED FILL — HYDROCHLOROTHIAZIDE 25 MG T: 25 | 30 days supply | Qty: 30 | Fill #4

## 2017-11-18 MED FILL — CITALOPRAM HBR 40 MG TABLET: 40 | 30 days supply | Qty: 30 | Fill #0

## 2017-11-18 MED FILL — AMLODIPINE BESYLATE 10 MG T: 10 | 30 days supply | Qty: 30 | Fill #4

## 2017-11-18 MED FILL — PENICILLIN VK 500 MG TABLET: 500 | 15 days supply | Qty: 60 | Fill #1

## 2017-11-18 MED FILL — LISINOPRIL 20 MG TABLET: 20 | 30 days supply | Qty: 30 | Fill #4

## 2017-11-18 MED FILL — ATORVASTATIN CALCIUM 40 MG: 40 | 30 days supply | Qty: 30 | Fill #1

## 2017-11-18 MED FILL — MELOXICAM 15 MG TABLET: 15 | 30 days supply | Qty: 30 | Fill #2

## 2017-11-18 MED FILL — GABAPENTIN 300 MG CAPSULE: 300 | 30 days supply | Qty: 180 | Fill #4

## 2017-12-02 ENCOUNTER — Ambulatory Visit (INDEPENDENT_AMBULATORY_CARE_PROVIDER_SITE_OTHER): Payer: Medicaid Other | Admitting: Orthopaedic Surgery

## 2017-12-03 ENCOUNTER — Ambulatory Visit (INDEPENDENT_AMBULATORY_CARE_PROVIDER_SITE_OTHER): Payer: Medicaid Other | Admitting: Orthopaedic Surgery

## 2017-12-03 ENCOUNTER — Ambulatory Visit (INDEPENDENT_AMBULATORY_CARE_PROVIDER_SITE_OTHER): Payer: Medicaid Other

## 2017-12-03 DIAGNOSIS — M5416 Radiculopathy, lumbar region: Secondary | ICD-10-CM | POA: Diagnosis not present

## 2017-12-03 NOTE — Progress Notes (Signed)
   Office Visit Note   Patient: Keymani Mclean           Date of Birth: 1965/03/14           MRN: [id number] Visit Date: 12/03/2017              Requested by: Rodolph Bong, MD [address] [place], [name] [address] PCP: Rodolph Bong, MD   Assessment & Plan: Visit Diagnoses:  1. Lumbar radiculopathy     Plan: At this point he has had no relief from [facility] or any conservative treatment that we have done for him.  I recommend referral to a neurosurgeon for further evaluation and treatment.  Follow-Up Instructions: Return if symptoms worsen or fail to improve.   Orders:  Orders Placed This Encounter  Procedures  . XR Pelvis 1-2 Views   No orders of the defined types were placed in this encounter.     Procedures: No procedures performed   Clinical Data: No additional findings.   Subjective: Chief Complaint  Patient presents with  . Lower Back - Pain    Mr. Zollner follows up today status post lumbar ESI.  He states he had minimal relief from this.   Review of Systems   Objective: Vital Signs: There were no vitals taken for this visit.  Physical Exam  Ortho Exam He continues to complain of pain that radiates from his lower back down into the bilateral hips and down all the way to his toes.  He has antalgic gait. Specialty Comments:  No specialty comments available.  Imaging: Xr Pelvis 1-2 Views  Result Date: 12/03/2017 No acute or structural abnormalities.    PMFS History: Patient Active Problem List   Diagnosis Date Noted  . Essential tremor 09/20/2017  . Transaminitis 03/23/2017  . Knee pain, bilateral 02/18/2017  . HTN (hypertension) 11/15/2015  . Trigger finger, left ring finger 11/15/2015  . Chronic back pain 08/21/2015  . Major depression 08/21/2015  . 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 Chronic bilateral low back pain 07/24/2015  . Pars defect of lumbar spine 07/24/2015   Past Medical History:  Diagnosis Date    . Gout   . Hypertension   . Kidney stones     No family history on file.  Past Surgical History:  Procedure Laterality Date  . TONSILLECTOMY AND ADENOIDECTOMY     Social History   Occupational History  . Not on file  Tobacco Use  . Smoking status: Never Smoker  . Smokeless tobacco: Never Used  Substance and Sexual Activity  . Alcohol use: No  . Drug use: No  . Sexual activity: Not on file

## 2017-12-03 NOTE — Addendum Note (Signed)
Addended by: Albertina Parr on: 12/03/2017 11:03 AM   Modules accepted: Orders

## 2017-12-15 DIAGNOSIS — M549 Dorsalgia, unspecified: Secondary | ICD-10-CM | POA: Diagnosis not present

## 2017-12-21 ENCOUNTER — Encounter: Payer: Self-pay | Admitting: Family Medicine

## 2017-12-21 ENCOUNTER — Ambulatory Visit: Payer: Medicaid Other | Admitting: Family Medicine

## 2017-12-21 VITALS — BP 130/94 | HR 102 | Ht 70.0 in | Wt 253.0 lb

## 2017-12-21 DIAGNOSIS — F321 Major depressive disorder, single episode, moderate: Secondary | ICD-10-CM

## 2017-12-21 DIAGNOSIS — G8929 Other chronic pain: Secondary | ICD-10-CM

## 2017-12-21 DIAGNOSIS — M25561 Pain in right knee: Secondary | ICD-10-CM | POA: Diagnosis not present

## 2017-12-21 DIAGNOSIS — G250 Essential tremor: Secondary | ICD-10-CM | POA: Diagnosis not present

## 2017-12-21 DIAGNOSIS — Z1211 Encounter for screening for malignant neoplasm of colon: Secondary | ICD-10-CM

## 2017-12-21 DIAGNOSIS — M4306 Spondylolysis, lumbar region: Secondary | ICD-10-CM | POA: Diagnosis not present

## 2017-12-21 DIAGNOSIS — M25562 Pain in left knee: Secondary | ICD-10-CM

## 2017-12-21 DIAGNOSIS — M5416 Radiculopathy, lumbar region: Secondary | ICD-10-CM

## 2017-12-21 DIAGNOSIS — M5441 Lumbago with sciatica, right side: Secondary | ICD-10-CM

## 2017-12-21 DIAGNOSIS — M5442 Lumbago with sciatica, left side: Secondary | ICD-10-CM | POA: Diagnosis not present

## 2017-12-21 DIAGNOSIS — Z23 Encounter for immunization: Secondary | ICD-10-CM

## 2017-12-21 DIAGNOSIS — M170 Bilateral primary osteoarthritis of knee: Secondary | ICD-10-CM

## 2017-12-21 DIAGNOSIS — I10 Essential (primary) hypertension: Secondary | ICD-10-CM | POA: Diagnosis not present

## 2017-12-21 MED ORDER — GABAPENTIN 300 MG PO CAPS: 600.0000 mg | ORAL_CAPSULE | Freq: Three times a day (TID) | ORAL | 1 refills | Status: DC

## 2017-12-21 MED ORDER — MELOXICAM 15 MG PO TABS: 15.0000 mg | ORAL_TABLET | Freq: Every day | ORAL | 1 refills | Status: DC

## 2017-12-21 MED ORDER — CITALOPRAM HYDROBROMIDE 40 MG PO TABS: 40.0000 mg | ORAL_TABLET | Freq: Every day | ORAL | 1 refills | Status: DC

## 2017-12-21 MED ORDER — PROPRANOLOL HCL ER 60 MG PO CP24: 60.0000 mg | ORAL_CAPSULE | Freq: Every day | ORAL | 2 refills | Status: DC

## 2017-12-21 MED ORDER — TIZANIDINE HCL 2 MG PO CAPS: 2.0000 mg | ORAL_CAPSULE | Freq: Three times a day (TID) | ORAL | 3 refills | Status: DC | PRN

## 2017-12-21 MED ORDER — LISINOPRIL 10 MG PO TABS: 10.0000 mg | ORAL_TABLET | Freq: Every day | ORAL | 1 refills | Status: DC

## 2017-12-21 MED FILL — MELOXICAM 15 MG TABLET: 15 | 30 days supply | Qty: 30 | Fill #0

## 2017-12-21 MED FILL — tiZANidine HCL 2 MG TABS: 2 | 30 days supply | Qty: 90 | Fill #0

## 2017-12-21 MED FILL — GABAPENTIN 300 MG CAPSULE: 300 | 30 days supply | Qty: 180 | Fill #0

## 2017-12-21 MED FILL — PROPRANOLOL HCL ER 60 MG CP: 60 | 30 days supply | Qty: 30 | Fill #0

## 2017-12-21 MED FILL — CITALOPRAM HBR 40 MG TABLET: 40 | 30 days supply | Qty: 30 | Fill #0

## 2017-12-21 MED FILL — LISINOPRIL 10 MG TABLET: 10 | 30 days supply | Qty: 30 | Fill #0

## 2017-12-21 NOTE — Progress Notes (Signed)
William Vincent is a 52 y.o. male who presents to [facility] today for follow-up tremor, hypertension, chronic pain, mood.  William Vincent has a history of hypertension.  He previously was taking amlodipine 10, lisinopril 20, hydrochlorothiazide 25, and was started on propranolol for tremor.  On all of these medications he felt poorly felt lightheaded dizzy and sweating.  He discontinued all of the medications and is here for recheck.  Off of all the blood pressure medicines he feels quite well with no chest pain palpitations or shortness of breath.   William Vincent also has a history of essential tremor.  He did quite well on propranolol extended release 60 mg daily.  However with all of his blood pressure medications as above he felt poorly and discontinued it.  Off of the propranolol he notes tremor has returned that is obnoxious.  William Vincent continues to be bothered by chronic back and leg pain.  He has had work-up including a recent repeat MRI which continues to show spinal listhesis without significant neural impingement.  Additionally he has had x-rays of his knees and hips that show minimal to mild degenerative changes.  He has had epidural steroid injections and intra-articular knee injections which have not provided significant relief.  He is followed up with neurosurgery since and has a CT scan of his either back or pelvis pending per his report.  These notes are not available at the time of today's encounter.  Mood: William Vincent continues to be bothered by depression.  He takes citalopram as above.  He notes that it does help to control his symptoms.  He feels reasonably well.   ROS as above:  Exam:  BP (!) 130/94   Pulse (!) 102   Ht 5\' 10"  (1.778 m)   Wt 253 lb (114.8 kg)   BMI 36.30 kg/m  Wt Readings from Last 5 Encounters:  12/21/17 253 lb (114.8 kg)  10/28/17 216 lb (98 kg)    10/27/17 246 lb (111.6 kg)  10/20/17 244 lb (110.7 kg)  09/20/17 250 lb (113.4 kg)    Gen: Well NAD HEENT: EOMI,  MMM Lungs: Normal work of breathing. CTABL Heart: RRR no MRG Abd: NABS, Soft. Nondistended, Nontender Exts: Brisk capillary refill, warm and well perfused.  Spine: Nontender to spinal midline.  Decreased spinal motion.  Antalgic gait present. Knees bilaterally normal-appearing normal motion mildly tender antalgic gait present.  Lab and Radiology Results MRI L-spine and x-ray and these images personally independently reviewed   Assessment and Plan: 52 y.o. male with  Hypertension: Blood pressure bit elevated today.  Plan to restart lower dose lisinopril at 10 mg along with propranolol for tremor.  Recheck in 3 months.  Likely recheck labs at that visit.  Tremor: Previously reasonably well controlled on propranolol 60 mg daily.  Plan to restart 60 mg propranolol extended release daily and recheck in 3 months.  Chronic pain: Difficult to evaluate total cause.  I think you would benefit from a repeat trial of physical therapy.  Physical therapy ordered.  Mood: Reasonably well controlled on citalopram continue current regimen.  Referral to gastroenterology for colon cancer screening ordered. Flu vaccine given today.   Orders Placed This Encounter  Procedures  . Flu Vaccine QUAD 6+ mos PF IM (Fluarix Quad PF)   No orders of the defined types were placed in this encounter.    Historical information moved to improve visibility of documentation.  Past Medical History:  Diagnosis Date  . Gout   . Hypertension   . Kidney stones    Past Surgical History:  Procedure Laterality Date  . TONSILLECTOMY AND ADENOIDECTOMY     Social History   Tobacco Use  . Smoking status: Never Smoker  . Smokeless tobacco: Never Used  Substance Use Topics  . Alcohol use: No   family history is not on file.  Medications: Current Outpatient Medications  Medication Sig Dispense  Refill  . amitriptyline (ELAVIL) 25 MG tablet Take 1 tablet (25 mg total) by mouth at bedtime as needed (pain). 90 tablet 1  . amLODipine (NORVASC) 10 MG tablet Take 1 tablet (10 mg total) by mouth daily. 90 tablet 1  . atorvastatin (LIPITOR) 40 MG tablet Take 1 tablet (40 mg total) by mouth daily. 90 tablet 1  . citalopram (CELEXA) 40 MG tablet Take 1 tablet (40 mg total) by mouth daily. 90 tablet 1  . diclofenac sodium (VOLTAREN) 1 % GEL Apply 4 g topically 4 (four) times daily. To affected joint. 100 g 11  . fluticasone (FLONASE) 50 MCG/ACT nasal spray Place 1 spray into both nostrils daily. 16 g 2  . gabapentin (NEURONTIN) 300 MG capsule Take 2 capsules (600 mg total) by mouth 3 (three) times daily. 540 capsule 1  . hydrochlorothiazide (HYDRODIURIL) 25 MG tablet Take 25 mg by mouth daily.  1  . HYDROcodone-acetaminophen (NORCO/VICODIN) 5-325 MG tablet Take 1 tablet by mouth every 6 (six) hours as needed. 15 tablet 0  . lidocaine (LIDODERM) 5 % Place 1 patch onto the skin daily. Remove & Discard patch within 12 hours or as directed by MD 30 patch 0  . lisinopril (PRINIVIL,ZESTRIL) 20 MG tablet Take 1 tablet (20 mg total) by mouth daily. 90 tablet 1  . meloxicam (MOBIC) 15 MG tablet Take 1 tablet (15 mg total) by mouth daily. 30 tablet 0  . methocarbamol (ROBAXIN) 500 MG tablet Take 1 tablet (500 mg total) by mouth at bedtime as needed for muscle spasms. 20 tablet 0  . penicillin v potassium (VEETID) 500 MG tablet Take 1 tablet (500 mg total) by mouth 4 (four) times daily. 60 tablet 2  . predniSONE (STERAPRED UNI-PAK 21 TAB) 10 MG (21) TBPK tablet Take as directed 21 tablet 0  . propranolol ER (INDERAL LA) 120 MG 24 hr capsule Take 1 capsule (120 mg total) by mouth daily. For tremor 90 capsule 1  . propranolol ER (INDERAL LA) 60 MG 24 hr capsule Take 60 mg by mouth daily.   1  . tizanidine (ZANAFLEX) 2 MG capsule Take 1 capsule (2 mg total) by mouth 3 (three) times daily as needed for muscle  spasms. 30 capsule 0   No current facility-administered medications for this visit.    Allergies  Allergen Reactions  . Flexeril [Cyclobenzaprine] Hives and Rash     Discussed warning signs or symptoms. Please see discharge instructions. Patient expresses understanding.

## 2017-12-21 NOTE — Patient Instructions (Addendum)
Thank you for coming in today. STOP amlodipine and HCTZ blood pressure pills.  STOP lisinopril 20mg  blood pressure pill.  Start lisinopril 10mg  blood pressure pill.  REstart propranolol tremor pill  Recheck with me in 3 months.  Return sooner if needed.   We will order physical therapy in [place].  Let me know if you do not hear anything.   I will also order referral to stomach doctor for colon cancer screening.

## 2017-12-28 ENCOUNTER — Encounter: Payer: Self-pay | Admitting: Gastroenterology

## 2018-02-09 ENCOUNTER — Encounter: Payer: Self-pay | Admitting: Gastroenterology

## 2018-03-23 ENCOUNTER — Encounter: Payer: Self-pay | Admitting: Family Medicine

## 2018-03-23 ENCOUNTER — Ambulatory Visit (INDEPENDENT_AMBULATORY_CARE_PROVIDER_SITE_OTHER): Payer: Medicaid Other | Admitting: Family Medicine

## 2018-03-23 VITALS — BP 149/111 | HR 79 | Ht 70.0 in | Wt 267.0 lb

## 2018-03-23 DIAGNOSIS — G250 Essential tremor: Secondary | ICD-10-CM

## 2018-03-23 DIAGNOSIS — G8929 Other chronic pain: Secondary | ICD-10-CM

## 2018-03-23 DIAGNOSIS — M5416 Radiculopathy, lumbar region: Secondary | ICD-10-CM

## 2018-03-23 DIAGNOSIS — Z1211 Encounter for screening for malignant neoplasm of colon: Secondary | ICD-10-CM | POA: Diagnosis not present

## 2018-03-23 DIAGNOSIS — M5441 Lumbago with sciatica, right side: Secondary | ICD-10-CM | POA: Diagnosis not present

## 2018-03-23 DIAGNOSIS — M5442 Lumbago with sciatica, left side: Secondary | ICD-10-CM | POA: Diagnosis not present

## 2018-03-23 DIAGNOSIS — I10 Essential (primary) hypertension: Secondary | ICD-10-CM | POA: Diagnosis not present

## 2018-03-23 DIAGNOSIS — F321 Major depressive disorder, single episode, moderate: Secondary | ICD-10-CM

## 2018-03-23 MED ORDER — NAPROXEN 500 MG PO TABS: 500.0000 mg | ORAL_TABLET | Freq: Two times a day (BID) | ORAL | 1 refills | Status: DC | PRN

## 2018-03-23 MED ORDER — PROPRANOLOL HCL ER 60 MG PO CP24: 60.0000 mg | ORAL_CAPSULE | Freq: Every day | ORAL | 2 refills | Status: DC

## 2018-03-23 MED ORDER — ATORVASTATIN CALCIUM 40 MG PO TABS: 40.0000 mg | ORAL_TABLET | Freq: Every day | ORAL | 1 refills | Status: DC

## 2018-03-23 MED ORDER — HYDROCHLOROTHIAZIDE 25 MG PO TABS: 25.0000 mg | ORAL_TABLET | Freq: Every day | ORAL | 2 refills | Status: DC

## 2018-03-23 MED FILL — ATORVASTATIN CALCIUM 40 MG: 40 | 30 days supply | Qty: 30 | Fill #0

## 2018-03-23 MED FILL — HYDROCHLOROTHIAZIDE 25 MG T: 25 | 30 days supply | Qty: 30 | Fill #0

## 2018-03-23 MED FILL — NAPROXEN 500 MG TABLET: 500 | 30 days supply | Qty: 60 | Fill #0

## 2018-03-23 MED FILL — PROPRANOLOL HCL ER 60 MG CP: 60 | 90 days supply | Qty: 90 | Fill #0

## 2018-03-23 NOTE — Progress Notes (Signed)
William Vincent is a 53 y.o. male who [name] today for  Hypertension, essential tremor, chronic back and leg pain, depression.  Hypertension: Previously controlled with lisinopril, amlodipine hydrochlorothiazide, and propranolol.  He had some lightheadedness and dizziness and his blood pressure medications were reduced to lisinopril 10, and propranolol.  In the interim he has run out of his propranolol.  He has been taking the lisinopril.  No chest pain lightheadedness or dizziness.  He does not check his blood pressure regularly.  Essential tremor: Patient had done reasonably well with propranolol extended release 60 mg daily.  In the interim he notes that it was effective however he ran out and needs a refill.  Mood: Orman takes Celexa 40 mg daily.  He notes this works reasonably well.  He continues his current regimen.  Additionally he continues to experience low back pain and pain radiating down both legs right worse than left.  He had some sort of epidural steroid injection in the past which did not help but is not sure exactly which level it was.  He denies new weakness or numbness.     ROS as above:  Exam:  BP (!) 149/111   Pulse 79   Ht 5\' 10"  (1.778 m)   Wt 267 lb (121.1 kg)   BMI 38.31 kg/m  Wt Readings from Last 5 Encounters:  03/23/18 267 lb (121.1 kg)  12/21/17 253 lb (114.8 kg)  10/28/17 216 lb (98 kg)  10/27/17 246 lb (111.6 kg)  10/20/17 244 lb (110.7 kg)    Gen: Well NAD HEENT: EOMI,  MMM Lungs: Normal work of breathing. CTABL Heart: RRR no MRG Abd: NABS, Soft. Nondistended, Nontender Exts: Brisk capillary refill, warm and well perfused.  MSK: Lumbar spine nontender to spinal midline. Normal flexion decreased extension. Lower extremity strength is intact. Psych alert and oriented normal speech thought process and  affect  [facility] 03/23/2018 07/14/2017 09/18/2016 06/18/2016 10/15/2015  Decreased Interest 1 0 0 3 3  Down, Depressed, Hopeless 1 2 0 2 2  PHQ - 2 Score 2 2 0 5 5  Altered sleeping 1 2 1 2 3   Tired, decreased energy 1 3 2 2 3   Change in appetite 1 1 1 2 3   Feeling bad or failure about yourself  1 0 0 2 2  Trouble concentrating 0 1 0 2 2  Moving slowly or fidgety/restless 1 1 0 2 2  Suicidal thoughts 0 0 0 0 0  PHQ-9 Score 7 10 4 17 20   Difficult doing work/chores Somewhat difficult - - - Extremely dIfficult     Lab and Radiology Results EXAM: MRI LUMBAR SPINE WITHOUT CONTRAST  TECHNIQUE: Multiplanar, multisequence MR imaging of the lumbar spine was performed. No intravenous contrast was administered.  COMPARISON:  Plain films 10/27/2017.  FINDINGS: Segmentation:  Standard.  Alignment:  2 mm anterolisthesis L5-S1, otherwise anatomic.  Vertebrae:  No fracture, evidence of discitis, or bone lesion.  Conus medullaris and cauda equina: Conus extends to the L1 level. Conus and cauda equina appear normal.  Paraspinal and other soft tissues: Increased body habitus without signs of epidural lipomatosis.  Disc levels:  L1-L2:  Normal.  L2-L3:  Disc desiccation.  No impingement.  L3-L4:  Normal disc space.  Mild facet arthropathy.  No impingement.  L4-L5: Disc space narrowing and desiccation. Mild facet arthropathy. No impingement. Minimal interspinous bursa edema.  L5-S1: 2 mm  facet mediated anterolisthesis. Annular bulge/subligamentous protrusion. No significant stenosis, foraminal, or subarticular zone.  IMPRESSION: 2 mm anterolisthesis L5-S1 is facet mediated. Annular bulge/subligamentous protrusion at this level without impingement.  Minor lumbar degenerative changes elsewhere.   Electronically Signed   By: Elsie Stain M.D.   On: 11/01/2017 15:30 I personally (independently) visualized and performed the interpretation of the  images attached in this note.    Assessment and Plan: 53 y.o. male with  Hypertension: Not controlled.  Continue lisinopril and restart propranolol.  Add back hydrochlorothiazide.  Recheck 3 months.  Back pain with lumbar radiculopathy.  Plan for transforaminal epidural steroid injection at L5-S1.  Switch to naproxen from meloxicam.  Continue current regimen.  Tremor: Doing well refill propranolol.  Mood: Stable continue citalopram.  Refer to gastroenterology for colon cancer screening.  PDMP reviewed during this encounter. Orders Placed This Encounter  Procedures  . DG Epidurography    Order Specific Question:   Reason for Exam (SYMPTOM  OR DIAGNOSIS REQUIRED)    Answer:   Interlaminal L5-S1 BL symptoms    Order Specific Question:   Preferred imaging location?    Answer:   [id number]  . Ambulatory referral to Gastroenterology    Referral Priority:   Routine    Referral Type:   Consultation    Referral Reason:   Specialty Services Required    Number of Visits Requested:   1   Meds ordered this encounter  Medications  . propranolol ER (INDERAL LA) 60 MG 24 hr capsule    Sig: Take 1 capsule (60 mg total) by mouth daily. For tremor    Dispense:  90 capsule    Refill:  2  . hydrochlorothiazide (HYDRODIURIL) 25 MG tablet    Sig: Take 1 tablet (25 mg total) by mouth daily. For blood pressure    Dispense:  90 tablet    Refill:  2  . naproxen (NAPROSYN) 500 MG tablet    Sig: Take 1 tablet (500 mg total) by mouth 2 (two) times daily as needed for moderate pain.    Dispense:  180 tablet    Refill:  1    Replaces meloxicam  . atorvastatin (LIPITOR) 40 MG tablet    Sig: Take 1 tablet (40 mg total) by mouth daily. For cholesterol    Dispense:  90 tablet    Refill:  1     Historical information moved to improve visibility of documentation.  Past Medical History:  Diagnosis Date  . Gout   . Hypertension   . Kidney stones    Past Surgical History:  Procedure  Laterality Date  . TONSILLECTOMY AND ADENOIDECTOMY     Social History   Tobacco Use  . Smoking status: Never Smoker  . Smokeless tobacco: Never Used  Substance Use Topics  . Alcohol use: No   family history is not on file.  Medications: Current Outpatient Medications  Medication Sig Dispense Refill  . amitriptyline (ELAVIL) 25 MG tablet Take 1 tablet (25 mg total) by mouth at bedtime as needed (pain). 90 tablet 1  . atorvastatin (LIPITOR) 40 MG tablet Take 1 tablet (40 mg total) by mouth daily. For cholesterol 90 tablet 1  . citalopram (CELEXA) 40 MG tablet Take 1 tablet (40 mg total) by mouth daily. For depression 90 tablet 1  . diclofenac sodium (VOLTAREN) 1 % GEL Apply 4 g topically 4 (four) times daily. To affected joint. 100 g 11  . fluticasone (FLONASE) 50 MCG/ACT nasal spray Place  1 spray into both nostrils daily. 16 g 2  . gabapentin (NEURONTIN) 300 MG capsule Take 2 capsules (600 mg total) by mouth 3 (three) times daily. For nerve pain 540 capsule 1  . lidocaine (LIDODERM) 5 % Place 1 patch onto the skin daily. Remove & Discard patch within 12 hours or as directed by MD 30 patch 0  . lisinopril (PRINIVIL,ZESTRIL) 10 MG tablet Take 1 tablet (10 mg total) by mouth daily. For blood pressure 90 tablet 1  . propranolol ER (INDERAL LA) 60 MG 24 hr capsule Take 1 capsule (60 mg total) by mouth daily. For tremor 90 capsule 2  . tizanidine (ZANAFLEX) 2 MG capsule Take 1 capsule (2 mg total) by mouth 3 (three) times daily as needed for muscle spasms. 90 capsule 3  . hydrochlorothiazide (HYDRODIURIL) 25 MG tablet Take 1 tablet (25 mg total) by mouth daily. For blood pressure 90 tablet 2  . naproxen (NAPROSYN) 500 MG tablet Take 1 tablet (500 mg total) by mouth 2 (two) times daily as needed for moderate pain. 180 tablet 1   No current facility-administered medications for this visit.    Allergies  Allergen Reactions  . Flexeril [Cyclobenzaprine] Hives and Rash     Discussed warning  signs or symptoms. Please see discharge instructions. Patient expresses understanding.

## 2018-03-23 NOTE — Patient Instructions (Addendum)
Thank you for coming in today. Continue lisinopril for blood pressure.  Start HCTZ daily for blood pressure.  Re-start propranolol daily for tremor. Let me know if you only get a 30 day supply.   STOP meloxicam  Start naproxen twice daily as needed for pain.  You can take tylenol with naproxn .   You should hear about the back injection.    You should also hear about stomach doctor for colon cancer screening.   Recheck with me in 3 months or sooner if needed.

## 2018-04-01 ENCOUNTER — Encounter: Payer: Self-pay | Admitting: Family Medicine

## 2018-04-12 ENCOUNTER — Ambulatory Visit
Admission: RE | Admit: 2018-04-12 | Discharge: 2018-04-12 | Disposition: A | Discharge status: Home / Self Care | Payer: Medicaid Other | Source: Ambulatory Visit | Attending: Family Medicine | Admitting: Family Medicine

## 2018-04-12 DIAGNOSIS — M47817 Spondylosis without myelopathy or radiculopathy, lumbosacral region: Secondary | ICD-10-CM | POA: Diagnosis not present

## 2018-04-12 MED ORDER — IOPAMIDOL (ISOVUE-M 200) INJECTION 41%
1.0000 mL | Freq: Once | INTRAMUSCULAR | Status: AC
  Administered 2018-04-12: 1 mL via EPIDURAL

## 2018-04-12 MED ORDER — METHYLPREDNISOLONE ACETATE 40 MG/ML INJ SUSP (RADIOLOG
120.0000 mg | Freq: Once | INTRAMUSCULAR | Status: AC
  Administered 2018-04-12: 120 mg via EPIDURAL

## 2018-04-12 NOTE — Discharge Instructions (Signed)

## 2018-04-14 ENCOUNTER — Ambulatory Visit (INDEPENDENT_AMBULATORY_CARE_PROVIDER_SITE_OTHER): Payer: Medicaid Other | Admitting: Family Medicine

## 2018-04-14 ENCOUNTER — Encounter: Payer: Self-pay | Admitting: Family Medicine

## 2018-04-14 VITALS — BP 127/83 | HR 53

## 2018-04-14 DIAGNOSIS — I10 Essential (primary) hypertension: Secondary | ICD-10-CM | POA: Diagnosis not present

## 2018-04-14 DIAGNOSIS — M7989 Other specified soft tissue disorders: Secondary | ICD-10-CM | POA: Diagnosis not present

## 2018-04-14 MED ORDER — AMITRIPTYLINE HCL 25 MG PO TABS: 25.0000 mg | ORAL_TABLET | Freq: Every evening | ORAL | 1 refills | Status: DC | PRN

## 2018-04-14 MED ORDER — TIZANIDINE HCL 2 MG PO CAPS: 2.0000 mg | ORAL_CAPSULE | Freq: Three times a day (TID) | ORAL | 3 refills | Status: DC | PRN

## 2018-04-14 MED ORDER — FUROSEMIDE 20 MG PO TABS: 20.0000 mg | ORAL_TABLET | Freq: Every day | ORAL | 3 refills | Status: DC

## 2018-04-14 MED ORDER — METHOCARBAMOL 500 MG PO TABS: 500.0000 mg | ORAL_TABLET | Freq: Three times a day (TID) | ORAL | 0 refills | Status: DC

## 2018-04-14 MED ORDER — NAPROXEN 500 MG PO TABS: 500.0000 mg | ORAL_TABLET | Freq: Two times a day (BID) | ORAL | 1 refills | Status: DC | PRN

## 2018-04-14 MED ORDER — ATORVASTATIN CALCIUM 40 MG PO TABS: 40.0000 mg | ORAL_TABLET | Freq: Every day | ORAL | 3 refills | Status: DC

## 2018-04-14 MED ORDER — CITALOPRAM HYDROBROMIDE 40 MG PO TABS: 40.0000 mg | ORAL_TABLET | Freq: Every day | ORAL | 1 refills | Status: DC

## 2018-04-14 MED ORDER — GABAPENTIN 300 MG PO CAPS: 600.0000 mg | ORAL_CAPSULE | Freq: Three times a day (TID) | ORAL | 1 refills | Status: DC

## 2018-04-14 MED FILL — AMITRIPTYLINE HCL 25 MG TAB: 25 | 90 days supply | Qty: 90 | Fill #0

## 2018-04-14 MED FILL — GABAPENTIN 300 MG CAPSULE: 300 | 90 days supply | Qty: 540 | Fill #0

## 2018-04-14 MED FILL — CITALOPRAM HBR 40 MG TABLET: 40 | 90 days supply | Qty: 90 | Fill #0

## 2018-04-14 MED FILL — METHOCARBAMOL 500 MG TABLET: 500 | 30 days supply | Qty: 90 | Fill #0

## 2018-04-14 MED FILL — tiZANidine HCL 2 MG TABS: 2 | 30 days supply | Qty: 90 | Fill #0

## 2018-04-14 MED FILL — FUROSEMIDE 20 MG TAB: 20 | 30 days supply | Qty: 30 | Fill #0

## 2018-04-14 NOTE — Progress Notes (Signed)
William Vincent is a 53 y.o. male who presents to [facility] today for leg swelling.  Kasandra Knudsen had a lumbar epidural steroid injection on February 4.  He developed bilateral lower extremity swelling yesterday.  He denies significant weight gain shortness of breath fevers or chills.  No new medications.  He notes he does not think the epidural steroid injection has helped his leg pain.  He denies any injury or change in mobility.  He takes his blood pressure medications listed below.   ROS as above:  Exam:  BP 127/83   Pulse (!) 53  Wt Readings from Last 5 Encounters:  03/23/18 267 lb (121.1 kg)  12/21/17 253 lb (114.8 kg)  10/28/17 216 lb (98 kg)  10/27/17 246 lb (111.6 kg)  10/20/17 244 lb (110.7 kg)    Gen: Well NAD HEENT: EOMI,  MMM Lungs: Normal work of breathing. CTABL Heart: RRR no MRG Abd: NABS, Soft. Nondistended, Nontender  Exts: Brisk capillary refill, warm and well perfused.  1+ edema bilateral lower extremities to calves.  Calf diameters are equal.  No palpable cords.  No calf erythema.  Not particularly tender.  Lab and Radiology Results No results found for this or any previous visit (from the past 72 hour(s)). Dg Inject Diag/thera/inc Needle/cath/plc Epi/lumb/sac W/img  Result Date: 04/12/2018 CLINICAL DATA:  Lumbosacral spondylosis without myelopathy. Low back and bilateral lower extremity pain, right slightly greater than left. FLUOROSCOPY TIME:  Fluoroscopy Time: 12 seconds Radiation Exposure Index: 51.2 for microGray*m^2 PROCEDURE: The procedure, risks, benefits, and alternatives were explained to the patient. Questions regarding the procedure were encouraged and answered. The patient understands and consents to the procedure. LUMBAR EPIDURAL INJECTION: An interlaminar approach was performed on the right at L5-S1. The overlying skin was cleansed and  anesthetized. A 6 inch 20 gauge epidural needle was advanced using loss-of-resistance technique. DIAGNOSTIC EPIDURAL INJECTION: Injection of Isovue-M 200 initially demonstrated substantial contrast washout consistent with partial venous uptake. The needle was repositioned and subsequent injection showed a good epidural pattern with spread above and below the level of needle placement, primarily on the right. THERAPEUTIC EPIDURAL INJECTION: 120 mg of Depo-Medrol mixed with 3 mL of 1% lidocaine were instilled. The procedure was well-tolerated, and the patient was discharged thirty minutes following the injection in good condition. COMPLICATIONS: None IMPRESSION: Technically successful lumbar interlaminar epidural injection on the right at L5-S1. Electronically Signed   By: Logan Bores M.D.   On: 04/12/2018 11:27      Assessment and Plan: 53 y.o. male with  Leg swelling.  Unclear etiology.  Patient had recent steroid exposure which could cause some fluid retention.  Additionally he has hypertension and has been over half a year since his last metabolic panel.  DVT quite unlikely given symmetrical nature of swelling.  Plan for short course of Lasix along with checking labs listed below including metabolic panel CBC and BNP.  Recheck in 1 month.  Return sooner if needed.  Precautions reviewed.  Chronic medications refilled.  PDMP not reviewed this encounter. Orders Placed This Encounter  Procedures  . CMP14+EGFR  . CBC  . B Nat Peptide   Meds ordered this encounter  Medications  . citalopram (CELEXA) 40 MG tablet    Sig: Take 1 tablet (40 mg total) by mouth daily. For depression    Dispense:  90 tablet    Refill:  1  . atorvastatin (LIPITOR) 40 MG tablet  Sig: Take 1 tablet (40 mg total) by mouth daily. For cholesterol    Dispense:  90 tablet    Refill:  3  . methocarbamol (ROBAXIN) 500 MG tablet    Sig: Take 1 tablet (500 mg total) by mouth 3 (three) times daily.    Dispense:  90  tablet    Refill:  0  . naproxen (NAPROSYN) 500 MG tablet    Sig: Take 1 tablet (500 mg total) by mouth 2 (two) times daily as needed for moderate pain.    Dispense:  180 tablet    Refill:  1    Replaces meloxicam  . gabapentin (NEURONTIN) 300 MG capsule    Sig: Take 2 capsules (600 mg total) by mouth 3 (three) times daily. For nerve pain    Dispense:  540 capsule    Refill:  1  . tizanidine (ZANAFLEX) 2 MG capsule    Sig: Take 1 capsule (2 mg total) by mouth 3 (three) times daily as needed for muscle spasms.    Dispense:  90 capsule    Refill:  3  . amitriptyline (ELAVIL) 25 MG tablet    Sig: Take 1 tablet (25 mg total) by mouth at bedtime as needed (pain).    Dispense:  90 tablet    Refill:  1  . furosemide (LASIX) 20 MG tablet    Sig: Take 1 tablet (20 mg total) by mouth daily.    Dispense:  30 tablet    Refill:  3     Historical information moved to improve visibility of documentation.  Past Medical History:  Diagnosis Date  . Gout   . Hypertension   . Kidney stones    Past Surgical History:  Procedure Laterality Date  . TONSILLECTOMY AND ADENOIDECTOMY     Social History   Tobacco Use  . Smoking status: Never Smoker  . Smokeless tobacco: Never Used  Substance Use Topics  . Alcohol use: No   family history is not on file.  Medications: Current Outpatient Medications  Medication Sig Dispense Refill  . amitriptyline (ELAVIL) 25 MG tablet Take 1 tablet (25 mg total) by mouth at bedtime as needed (pain). 90 tablet 1  . atorvastatin (LIPITOR) 40 MG tablet Take 1 tablet (40 mg total) by mouth daily. For cholesterol 90 tablet 3  . citalopram (CELEXA) 40 MG tablet Take 1 tablet (40 mg total) by mouth daily. For depression 90 tablet 1  . diclofenac sodium (VOLTAREN) 1 % GEL Apply 4 g topically 4 (four) times daily. To affected joint. 100 g 11  . fluticasone (FLONASE) 50 MCG/ACT nasal spray Place 1 spray into both nostrils daily. 16 g 2  . gabapentin (NEURONTIN) 300 MG  capsule Take 2 capsules (600 mg total) by mouth 3 (three) times daily. For nerve pain 540 capsule 1  . hydrochlorothiazide (HYDRODIURIL) 25 MG tablet Take 1 tablet (25 mg total) by mouth daily. For blood pressure 90 tablet 2  . lidocaine (LIDODERM) 5 % Place 1 patch onto the skin daily. Remove & Discard patch within 12 hours or as directed by MD 30 patch 0  . lisinopril (PRINIVIL,ZESTRIL) 10 MG tablet Take 1 tablet (10 mg total) by mouth daily. For blood pressure 90 tablet 1  . naproxen (NAPROSYN) 500 MG tablet Take 1 tablet (500 mg total) by mouth 2 (two) times daily as needed for moderate pain. 180 tablet 1  . propranolol ER (INDERAL LA) 60 MG 24 hr capsule Take 1 capsule (60 mg total)  by mouth daily. For tremor 90 capsule 2  . tizanidine (ZANAFLEX) 2 MG capsule Take 1 capsule (2 mg total) by mouth 3 (three) times daily as needed for muscle spasms. 90 capsule 3  . furosemide (LASIX) 20 MG tablet Take 1 tablet (20 mg total) by mouth daily. 30 tablet 3  . methocarbamol (ROBAXIN) 500 MG tablet Take 1 tablet (500 mg total) by mouth 3 (three) times daily. 90 tablet 0   No current facility-administered medications for this visit.    Allergies  Allergen Reactions  . Flexeril [Cyclobenzaprine] Hives and Rash     Discussed warning signs or symptoms. Please see discharge instructions. Patient expresses understanding.

## 2018-04-14 NOTE — Patient Instructions (Signed)
Thank you for coming in today. Get labs soon at [facility] Or which ever lab is near.  Let me know if I need to change the order.   Start Furosamide daily for fluid.   Recheck in 1 month.   Let me know how it goes.    Edema  Edema is an abnormal buildup of fluids in the body tissues and under the skin. Swelling of the legs, feet, and ankles is a common symptom that becomes more likely as you get older. Swelling is also common in looser tissues, like around the eyes. When the affected area is squeezed, the fluid may move out of that spot and leave a dent for a few moments. This dent is called pitting edema. There are many possible causes of edema. Eating too much salt (sodium) and being on your feet or sitting for a long time can cause edema in your legs, feet, and ankles. Hot weather may make edema worse. Common causes of edema include:  Heart failure.  Liver or kidney disease.  Weak leg blood vessels.  Cancer.  An injury.  Pregnancy.  Medicines.  Being obese.  Low protein levels in the blood. Edema is usually painless. Your skin may look swollen or shiny. Follow these instructions at home:  Keep the affected body part raised (elevated) above the level of your heart when you are sitting or lying down.  Do not sit still or stand for long periods of time.  Do not wear tight clothing. Do not wear garters on your upper legs.  Exercise your legs to get your circulation going. This helps to move the fluid back into your blood vessels, and it may help the swelling go down.  Wear elastic bandages or support stockings to reduce swelling as told by your health care provider.  Eat a low-salt (low-sodium) diet to reduce fluid as told by your health care provider.  Depending on the cause of your swelling, you may need to limit how much fluid you drink (fluid restriction).  Take over-the-counter and prescription medicines only as told by your health care provider. Contact a health  care provider if:  Your edema does not get better with treatment.  You have heart, liver, or kidney disease and have symptoms of edema.  You have sudden and unexplained weight gain. Get help right away if:  You develop shortness of breath or chest pain.  You cannot breathe when you lie down.  You develop pain, redness, or warmth in the swollen areas.  You have heart, liver, or kidney disease and suddenly get edema.  You have a fever and your symptoms suddenly get worse. Summary  Edema is an abnormal buildup of fluids in the body tissues and under the skin.  Eating too much salt (sodium) and being on your feet or sitting for a long time can cause edema in your legs, feet, and ankles.  Keep the affected body part raised (elevated) above the level of your heart when you are sitting or lying down. This information is not intended to replace advice given to you by your health care provider. Make sure you discuss any questions you have with your health care provider. Document Released: 02/23/2005 Document Revised: 03/28/2016 Document Reviewed: 03/28/2016 Elsevier Interactive Patient Education  2019 [employer].

## 2018-05-13 ENCOUNTER — Ambulatory Visit: Payer: Medicaid Other | Admitting: Family Medicine

## 2018-05-13 ENCOUNTER — Encounter: Payer: Self-pay | Admitting: Family Medicine

## 2018-05-13 VITALS — BP 124/86 | HR 60 | Ht 70.0 in | Wt 275.0 lb

## 2018-05-13 DIAGNOSIS — R609 Edema, unspecified: Secondary | ICD-10-CM | POA: Diagnosis not present

## 2018-05-13 DIAGNOSIS — Z125 Encounter for screening for malignant neoplasm of prostate: Secondary | ICD-10-CM

## 2018-05-13 DIAGNOSIS — R5383 Other fatigue: Secondary | ICD-10-CM | POA: Diagnosis not present

## 2018-05-13 DIAGNOSIS — G250 Essential tremor: Secondary | ICD-10-CM | POA: Diagnosis not present

## 2018-05-13 DIAGNOSIS — Z5181 Encounter for therapeutic drug level monitoring: Secondary | ICD-10-CM

## 2018-05-13 DIAGNOSIS — M4306 Spondylolysis, lumbar region: Secondary | ICD-10-CM | POA: Diagnosis not present

## 2018-05-13 DIAGNOSIS — I10 Essential (primary) hypertension: Secondary | ICD-10-CM | POA: Diagnosis not present

## 2018-05-13 MED ORDER — PRIMIDONE 50 MG PO TABS: ORAL_TABLET | ORAL | 0 refills | Status: DC

## 2018-05-13 MED ORDER — FUROSEMIDE 20 MG PO TABS: 20.0000 mg | ORAL_TABLET | Freq: Every day | ORAL | 0 refills | Status: DC

## 2018-05-13 MED ORDER — NAPROXEN 500 MG PO TABS: 500.0000 mg | ORAL_TABLET | Freq: Two times a day (BID) | ORAL | 1 refills | Status: DC | PRN

## 2018-05-13 MED ORDER — ATORVASTATIN CALCIUM 40 MG PO TABS: 40.0000 mg | ORAL_TABLET | Freq: Every day | ORAL | 3 refills | Status: DC

## 2018-05-13 MED ORDER — PREGABALIN 75 MG PO CAPS: 75.0000 mg | ORAL_CAPSULE | Freq: Two times a day (BID) | ORAL | 1 refills | Status: DC

## 2018-05-13 MED FILL — PREGABALIN 75 MG CAPS: 75 | 30 days supply | Qty: 60 | Fill #0

## 2018-05-13 MED FILL — NAPROXEN 500 MG TABLET: 500 | 90 days supply | Qty: 180 | Fill #0

## 2018-05-13 MED FILL — FUROSEMIDE 20 MG TAB: 20 | 90 days supply | Qty: 90 | Fill #0

## 2018-05-13 MED FILL — PRIMIDONE 50 MG TABLET: 50 | 30 days supply | Qty: 86 | Fill #0

## 2018-05-13 NOTE — Patient Instructions (Signed)
Thank you for coming in today.  STOP gabapentin.  Start lyrica.  Start Primidone for tremor.  Start 1/2 at night for 4 days then increase to 1 pill at night for 4 days.  Go up by 1/2 every 4 days until you take 5 pills at night.   Continue lasix for fluid.  Get labs now.   Recheck in 1 month.   Let me know if not doing well.

## 2018-05-13 NOTE — Progress Notes (Signed)
William Vincent is a 53 y.o. male who presents to [name]: Primary Care Sports Medicine today for follow-up leg swelling, radicular pain, tremor, hypertension.  William Vincent was seen previously for leg swelling 1 month ago.  He was prescribed 20 mg of Lasix.  He notes that when he takes a Lasix he does urinate and he notes his leg swelling has been improving.  He is pretty happy with how things are going.  Additionally he notes he continues to have pain in his legs bilaterally.  This is thought to be due to lumbar radiculopathy due to spondylolisthesis.  He has been using gabapentin which she finds to not be very effective.  He is interested in trying Lyrica.  He notes history of tremor of his hands bilaterally.  He is thought to have essential tremor and has had significant difficulty with tasks such as handwriting or eating or drinking.  He has been prescribed propranolol but notes that is not very effective.  He takes medications listed below for hypertension.  No chest pain palpitations or shortness of breath.   ROS as above:  Exam:  BP 124/86   Pulse 60   Ht 5\' 10"  (1.778 m)   Wt 275 lb (124.7 kg)   BMI 39.46 kg/m  Wt Readings from Last 5 Encounters:  05/13/18 275 lb (124.7 kg)  03/23/18 267 lb (121.1 kg)  12/21/17 253 lb (114.8 kg)  10/28/17 216 lb (98 kg)  10/27/17 246 lb (111.6 kg)    Gen: Well NAD HEENT: EOMI,  MMM Lungs: Normal work of breathing. CTABL Heart: RRR no MRG Abd: NABS, Soft. Nondistended, Nontender Exts: Brisk capillary refill, warm and well perfused.  Trace edema bilateral lower extremities. Neuro: No tremor at rest.  Tremor with hand motion bilaterally.      Assessment and Plan: 53 y.o. male with  Lower extremity edema: Improved.  Plan to continue Lasix.  Check basic labs listed below.  Tremor: Essential tremor type not controlled with propranolol.  Plan to use  primidone.  Will start taper at 25 mg increasing every 4 days to 250 mg at night.  Recheck in about 1 month.  Lumbar radiculopathy: Not controlled with gabapentin.  Trial of Lyrica.  Start at 75 mg twice daily.  May increase in the interim if needed.  Hypertension: Controlled continue current regimen.  Check labs below.  Additionally will screen for prostate cancer with PSA test today.  PDMP not reviewed this encounter. Orders Placed This Encounter  Procedures  . COMPLETE METABOLIC PANEL WITH GFR  . CBC  . TSH  . PSA   Meds ordered this encounter  Medications  . DISCONTD: atorvastatin (LIPITOR) 40 MG tablet    Sig: Take 1 tablet (40 mg total) by mouth daily. For cholesterol    Dispense:  90 tablet    Refill:  3  . pregabalin (LYRICA) 75 MG capsule    Sig: Take 1 capsule (75 mg total) by mouth 2 (two) times daily.    Dispense:  180 capsule    Refill:  1  . atorvastatin (LIPITOR) 40 MG tablet    Sig: Take 1 tablet (40 mg total) by mouth daily. For cholesterol    Dispense:  90 tablet    Refill:  3  . primidone (MYSOLINE) 50 MG tablet    Sig: Take 0.5 tablets (25 mg total) by mouth at bedtime for 4 days, THEN 1 tablet (50 mg total) at bedtime for 4  days, THEN 2.5 tablets (125 mg total) at bedtime for 4 days, THEN 3 tablets (150 mg total) at bedtime for 4 days, THEN 3.5 tablets (175 mg total) at bedtime for 4 days, THEN 4 tablets (200 mg total) at bedtime for 4 days, THEN 4 tablets (200 mg total) at bedtime for 4 days, THEN 4.5 tablets (225 mg total) at bedtime for 4 days, THEN 5 tablets (250 mg total) at bedtime for 20 days.    Dispense:  192 tablet    Refill:  0  . furosemide (LASIX) 20 MG tablet    Sig: Take 1 tablet (20 mg total) by mouth daily.    Dispense:  90 tablet    Refill:  0  . naproxen (NAPROSYN) 500 MG tablet    Sig: Take 1 tablet (500 mg total) by mouth 2 (two) times daily as needed for moderate pain.    Dispense:  180 tablet    Refill:  1    Replaces meloxicam      Historical information moved to improve visibility of documentation.  Past Medical History:  Diagnosis Date  . Gout   . Hypertension   . Kidney stones    Past Surgical History:  Procedure Laterality Date  . TONSILLECTOMY AND ADENOIDECTOMY     Social History   Tobacco Use  . Smoking status: Never Smoker  . Smokeless tobacco: Never Used  Substance Use Topics  . Alcohol use: No   family history is not on file.  Medications: Current Outpatient Medications  Medication Sig Dispense Refill  . amitriptyline (ELAVIL) 25 MG tablet Take 1 tablet (25 mg total) by mouth at bedtime as needed (pain). 90 tablet 1  . atorvastatin (LIPITOR) 40 MG tablet Take 1 tablet (40 mg total) by mouth daily. For cholesterol 90 tablet 3  . citalopram (CELEXA) 40 MG tablet Take 1 tablet (40 mg total) by mouth daily. For depression 90 tablet 1  . diclofenac sodium (VOLTAREN) 1 % GEL Apply 4 g topically 4 (four) times daily. To affected joint. 100 g 11  . fluticasone (FLONASE) 50 MCG/ACT nasal spray Place 1 spray into both nostrils daily. 16 g 2  . furosemide (LASIX) 20 MG tablet Take 1 tablet (20 mg total) by mouth daily. 90 tablet 0  . hydrochlorothiazide (HYDRODIURIL) 25 MG tablet Take 1 tablet (25 mg total) by mouth daily. For blood pressure 90 tablet 2  . lidocaine (LIDODERM) 5 % Place 1 patch onto the skin daily. Remove & Discard patch within 12 hours or as directed by MD 30 patch 0  . lisinopril (PRINIVIL,ZESTRIL) 10 MG tablet Take 1 tablet (10 mg total) by mouth daily. For blood pressure 90 tablet 1  . methocarbamol (ROBAXIN) 500 MG tablet Take 1 tablet (500 mg total) by mouth 3 (three) times daily. 90 tablet 0  . naproxen (NAPROSYN) 500 MG tablet Take 1 tablet (500 mg total) by mouth 2 (two) times daily as needed for moderate pain. 180 tablet 1  . propranolol ER (INDERAL LA) 60 MG 24 hr capsule Take 1 capsule (60 mg total) by mouth daily. For tremor 90 capsule 2  . tizanidine (ZANAFLEX) 2 MG  capsule Take 1 capsule (2 mg total) by mouth 3 (three) times daily as needed for muscle spasms. 90 capsule 3  . pregabalin (LYRICA) 75 MG capsule Take 1 capsule (75 mg total) by mouth 2 (two) times daily. 180 capsule 1  . primidone (MYSOLINE) 50 MG tablet Take 0.5 tablets (25 mg total) by  mouth at bedtime for 4 days, THEN 1 tablet (50 mg total) at bedtime for 4 days, THEN 2.5 tablets (125 mg total) at bedtime for 4 days, THEN 3 tablets (150 mg total) at bedtime for 4 days, THEN 3.5 tablets (175 mg total) at bedtime for 4 days, THEN 4 tablets (200 mg total) at bedtime for 4 days, THEN 4 tablets (200 mg total) at bedtime for 4 days, THEN 4.5 tablets (225 mg total) at bedtime for 4 days, THEN 5 tablets (250 mg total) at bedtime for 20 days. 192 tablet 0   No current facility-administered medications for this visit.    Allergies  Allergen Reactions  . Flexeril [Cyclobenzaprine] Hives and Rash     Discussed warning signs or symptoms. Please see discharge instructions. Patient expresses understanding.

## 2018-05-14 LAB — COMPLETE METABOLIC PANEL WITH GFR
AG Ratio: 1.6 (calc) (ref 1.0–2.5)
ALT: 27 U/L (ref 9–46)
AST: 21 U/L (ref 10–35)
Albumin: 3.9 g/dL (ref 3.6–5.1)
Alkaline phosphatase (APISO): 78 U/L (ref 35–144)
BUN: 18 mg/dL (ref 7–25)
CHLORIDE: 106 mmol/L (ref 98–110)
CO2: 26 mmol/L (ref 20–32)
Calcium: 9.3 mg/dL (ref 8.6–10.3)
Creat: 1.3 mg/dL (ref 0.70–1.33)
GFR, EST NON AFRICAN AMERICAN: 64 mL/min/{1.73_m2} (ref >=60)
GFR, Est African American: 74 mL/min/{1.73_m2} (ref >=60)
Globulin: 2.5 g/dL (calc) (ref 1.9–3.7)
Glucose, Bld: 85 mg/dL (ref 65–99)
Potassium: 5.1 mmol/L (ref 3.5–5.3)
Sodium: 140 mmol/L (ref 135–146)
TOTAL PROTEIN: 6.4 g/dL (ref 6.1–8.1)
Total Bilirubin: 0.5 mg/dL (ref 0.2–1.2)

## 2018-05-14 LAB — TSH: TSH: 2.55 mIU/L (ref 0.40–4.50)

## 2018-05-14 LAB — CBC
HCT: 44.3 % (ref 38.5–50.0)
Hemoglobin: 15.2 g/dL (ref 13.2–17.1)
MCH: 30.1 pg (ref 27.0–33.0)
MCHC: 34.3 g/dL (ref 32.0–36.0)
MCV: 87.7 fL (ref 80.0–100.0)
MPV: 10.5 fL (ref 7.5–12.5)
Platelets: 314 10*3/uL (ref 140–400)
RBC: 5.05 10*6/uL (ref 4.20–5.80)
RDW: 14.2 % (ref 11.0–15.0)
WBC: 6.9 10*3/uL (ref 3.8–10.8)

## 2018-05-14 LAB — PSA: PSA: 0.6 ng/mL (ref <=4.0)

## 2018-06-15 ENCOUNTER — Ambulatory Visit: Payer: Medicaid Other | Admitting: Family Medicine

## 2018-06-17 DIAGNOSIS — M5442 Lumbago with sciatica, left side: Secondary | ICD-10-CM | POA: Diagnosis not present

## 2018-06-20 DIAGNOSIS — M5442 Lumbago with sciatica, left side: Secondary | ICD-10-CM | POA: Diagnosis not present

## 2018-06-22 DIAGNOSIS — M5442 Lumbago with sciatica, left side: Secondary | ICD-10-CM | POA: Diagnosis not present

## 2018-06-24 DIAGNOSIS — M5442 Lumbago with sciatica, left side: Secondary | ICD-10-CM | POA: Diagnosis not present

## 2018-06-27 DIAGNOSIS — M5442 Lumbago with sciatica, left side: Secondary | ICD-10-CM | POA: Diagnosis not present

## 2018-06-29 DIAGNOSIS — M5442 Lumbago with sciatica, left side: Secondary | ICD-10-CM | POA: Diagnosis not present

## 2018-07-01 DIAGNOSIS — M5442 Lumbago with sciatica, left side: Secondary | ICD-10-CM | POA: Diagnosis not present

## 2018-07-04 DIAGNOSIS — M5442 Lumbago with sciatica, left side: Secondary | ICD-10-CM | POA: Diagnosis not present

## 2018-07-06 DIAGNOSIS — M5442 Lumbago with sciatica, left side: Secondary | ICD-10-CM | POA: Diagnosis not present

## 2018-07-08 DIAGNOSIS — M5442 Lumbago with sciatica, left side: Secondary | ICD-10-CM | POA: Diagnosis not present

## 2018-07-11 DIAGNOSIS — M5442 Lumbago with sciatica, left side: Secondary | ICD-10-CM | POA: Diagnosis not present

## 2018-07-13 DIAGNOSIS — M5442 Lumbago with sciatica, left side: Secondary | ICD-10-CM | POA: Diagnosis not present

## 2018-07-14 MED FILL — PRIMIDONE 50 MG TABLET: 50 | 30 days supply | Qty: 86 | Fill #1

## 2018-07-14 MED FILL — AMITRIPTYLINE HCL 25 MG TAB: 25 | 90 days supply | Qty: 90 | Fill #1

## 2018-07-14 MED FILL — tiZANidine HCL 2 MG TABS: 2 | 30 days supply | Qty: 90 | Fill #1

## 2018-07-14 MED FILL — CITALOPRAM HBR 40 MG TABLET: 40 | 90 days supply | Qty: 90 | Fill #1

## 2018-07-14 MED FILL — GABAPENTIN 300 MG CAPSULE: 300 | 90 days supply | Qty: 540 | Fill #1

## 2018-07-15 DIAGNOSIS — M5442 Lumbago with sciatica, left side: Secondary | ICD-10-CM | POA: Diagnosis not present

## 2018-07-15 MED FILL — ATORVASTATIN 40 MG TABLET: 40 | 30 days supply | Qty: 30 | Fill #0

## 2018-07-15 MED FILL — PROPRANOLOL HCL ER 60 MG CP: 60 | 90 days supply | Qty: 90 | Fill #0

## 2018-07-15 MED FILL — PREGABALIN 75 MG CAPS: 75 | 30 days supply | Qty: 60 | Fill #1

## 2018-07-15 MED FILL — LISINOPRIL 10 MG TABLET: 10 | 30 days supply | Qty: 30 | Fill #0

## 2018-07-15 MED FILL — HYDROCHLOROTHIAZIDE 25 MG T: 25 | 30 days supply | Qty: 30 | Fill #0

## 2018-07-18 ENCOUNTER — Ambulatory Visit: Payer: Medicaid Other | Admitting: Family Medicine

## 2018-07-18 ENCOUNTER — Encounter: Payer: Self-pay | Admitting: Family Medicine

## 2018-07-18 VITALS — BP 122/74 | HR 62 | Temp 98.4°F | Wt 279.0 lb

## 2018-07-18 DIAGNOSIS — F321 Major depressive disorder, single episode, moderate: Secondary | ICD-10-CM

## 2018-07-18 DIAGNOSIS — M5441 Lumbago with sciatica, right side: Secondary | ICD-10-CM | POA: Diagnosis not present

## 2018-07-18 DIAGNOSIS — M4306 Spondylolysis, lumbar region: Secondary | ICD-10-CM | POA: Diagnosis not present

## 2018-07-18 DIAGNOSIS — I10 Essential (primary) hypertension: Secondary | ICD-10-CM

## 2018-07-18 DIAGNOSIS — G250 Essential tremor: Secondary | ICD-10-CM

## 2018-07-18 DIAGNOSIS — M5442 Lumbago with sciatica, left side: Secondary | ICD-10-CM

## 2018-07-18 DIAGNOSIS — G8929 Other chronic pain: Secondary | ICD-10-CM

## 2018-07-18 MED ORDER — ATORVASTATIN CALCIUM 40 MG PO TABS: 40.0000 mg | ORAL_TABLET | Freq: Every day | ORAL | 3 refills | Status: DC

## 2018-07-18 MED ORDER — LISINOPRIL 10 MG PO TABS: 10.0000 mg | ORAL_TABLET | Freq: Every day | ORAL | 1 refills | Status: DC

## 2018-07-18 MED ORDER — PRIMIDONE 250 MG PO TABS: 250.0000 mg | ORAL_TABLET | Freq: Every day | ORAL | 1 refills | Status: DC

## 2018-07-18 MED ORDER — BUPROPION HCL ER (XL) 150 MG PO TB24: 150.0000 mg | ORAL_TABLET | ORAL | 1 refills | Status: DC

## 2018-07-18 MED ORDER — TRIAMCINOLONE ACETONIDE 0.1 % EX CREA: 1.0000 "application " | TOPICAL_CREAM | Freq: Two times a day (BID) | CUTANEOUS | 12 refills | Status: AC

## 2018-07-18 MED ORDER — FUROSEMIDE 20 MG PO TABS: 20.0000 mg | ORAL_TABLET | Freq: Every day | ORAL | 1 refills | Status: AC

## 2018-07-18 MED FILL — TRIAMCINOLONE 0.1% CREAM: 0.1 | 30 days supply | Qty: 454 | Fill #0

## 2018-07-18 MED FILL — buPROPion HCL ER (XL) 150 M: 150 | 30 days supply | Qty: 30 | Fill #0

## 2018-07-18 NOTE — Patient Instructions (Addendum)
Thank you for coming in today. For primidone take 1/2 pill for 1 week then increase to a full pill after the first week.  Continue medicines for blood pressure and cholesterol and pain.,   Add Bupropion (wellbutrin) daily.    We should recheck in video or phone in 1 month or sooner if needed.    Bupropion extended-release tablets (Depression/Mood Disorders) What is this medicine? BUPROPION (byoo PROE pee on) is used to treat depression. This medicine may be used for other purposes; ask your health care provider or pharmacist if you have questions. COMMON BRAND NAME(S): Aplenzin, Budeprion XL, Forfivo XL, Wellbutrin XL What should I tell my health care provider before I take this medicine? They need to know if you have any of these conditions: -an eating disorder, such as anorexia or bulimia -bipolar disorder or psychosis -diabetes or high blood sugar, treated with medication -glaucoma -head injury or brain tumor -heart disease, previous heart attack, or irregular heart beat -high blood pressure -kidney or liver disease -seizures (convulsions) -suicidal thoughts or a previous suicide attempt -Tourette's syndrome -weight loss -an unusual or allergic reaction to bupropion, other medicines, foods, dyes, or preservatives -breast-feeding -pregnant or trying to become pregnant How should I use this medicine? Take this medicine by mouth with a glass of water. Follow the directions on the prescription label. You can take it with or without food. If it upsets your stomach, take it with food. Do not crush, chew, or cut these tablets. This medicine is taken once daily at the same time each day. Do not take your medicine more often than directed. Do not stop taking this medicine suddenly except upon the advice of your doctor. Stopping this medicine too quickly may cause serious side effects or your condition may worsen. A special MedGuide will be given to you by the pharmacist with each  prescription and refill. Be sure to read this information carefully each time. Talk to your pediatrician regarding the use of this medicine in children. Special care may be needed. Overdosage: If you think you have taken too much of this medicine contact a poison control center or emergency room at once. NOTE: This medicine is only for you. Do not share this medicine with others. What if I miss a dose? If you miss a dose, skip the missed dose and take your next tablet at the regular time. Do not take double or extra doses. What may interact with this medicine? Do not take this medicine with any of the following medications: -linezolid -MAOIs like Azilect, Carbex, Eldepryl, Marplan, Nardil, and Parnate -methylene blue (injected into a vein) -other medicines that contain bupropion like Zyban This medicine may also interact with the following medications: -alcohol -certain medicines for anxiety or sleep -certain medicines for blood pressure like metoprolol, propranolol -certain medicines for depression or psychotic disturbances -certain medicines for HIV or AIDS like efavirenz, lopinavir, nelfinavir, ritonavir -certain medicines for irregular heart beat like propafenone, flecainide -certain medicines for Parkinson's disease like amantadine, levodopa -certain medicines for seizures like carbamazepine, phenytoin, phenobarbital -cimetidine -clopidogrel -cyclophosphamide -digoxin -furazolidone -isoniazid -nicotine -orphenadrine -procarbazine -steroid medicines like prednisone or cortisone -stimulant medicines for attention disorders, weight loss, or to stay awake -tamoxifen -theophylline -thiotepa -ticlopidine -tramadol -warfarin This list may not describe all possible interactions. Give your health care provider a list of all the medicines, herbs, non-prescription drugs, or dietary supplements you use. Also tell them if you smoke, drink alcohol, or use illegal drugs. Some items may  interact with your  medicine. What should I watch for while using this medicine? Tell your doctor if your symptoms do not get better or if they get worse. Visit your doctor or health care professional for regular checks on your progress. Because it may take several weeks to see the full effects of this medicine, it is important to continue your treatment as prescribed by your doctor. Patients and their families should watch out for new or worsening thoughts of suicide or depression. Also watch out for sudden changes in feelings such as feeling anxious, agitated, panicky, irritable, hostile, aggressive, impulsive, severely restless, overly excited and hyperactive, or not being able to sleep. If this happens, especially at the beginning of treatment or after a change in dose, call your health care professional. Avoid alcoholic drinks while taking this medicine. Drinking large amounts of alcoholic beverages, using sleeping or anxiety medicines, or quickly stopping the use of these agents while taking this medicine may increase your risk for a seizure. Do not drive or use heavy machinery until you know how this medicine affects you. This medicine can impair your ability to perform these tasks. Do not take this medicine close to bedtime. It may prevent you from sleeping. Your mouth may get dry. Chewing sugarless gum or sucking hard candy, and drinking plenty of water may help. Contact your doctor if the problem does not go away or is severe. The tablet shell for some brands of this medicine does not dissolve. This is normal. The tablet shell may appear whole in the stool. This is not a cause for concern. What side effects may I notice from receiving this medicine? Side effects that you should report to your doctor or health care professional as soon as possible: -allergic reactions like skin rash, itching or hives, swelling of the face, lips, or tongue -breathing problems -changes in  vision -confusion -elevated mood, decreased need for sleep, racing thoughts, impulsive behavior -fast or irregular heartbeat -hallucinations, loss of contact with reality -increased blood pressure -redness, blistering, peeling or loosening of the skin, including inside the mouth -seizures -suicidal thoughts or other mood changes -unusually weak or tired -vomiting Side effects that usually do not require medical attention (report to your doctor or health care professional if they continue or are bothersome): -constipation -headache -loss of appetite -nausea -tremors -weight loss This list may not describe all possible side effects. Call your doctor for medical advice about side effects. You may report side effects to FDA at 1-800-FDA-1088. Where should I keep my medicine? Keep out of the reach of children. Store at room temperature between 15 and 30 degrees C (59 and 86 degrees F). Throw away any unused medicine after the expiration date. NOTE: This sheet is a summary. It may not cover all possible information. If you have questions about this medicine, talk to your doctor, pharmacist, or health care provider.  2019 Elsevier/Gold Standard (2015-08-16 13:55:13)

## 2018-07-18 NOTE — Progress Notes (Signed)
William Vincent is a 53 y.o. male who presents to [facility]: Primary Care Sports Medicine today for hypertension essential tremor back pain and depression.  Hypertension: Taking medication listed below.  No chest pain palpitation shortness of breath lightheadedness or dizziness.  He feels pretty well.  Tremor: Failed propranolol.  Patient was started on primidone 2 months ago during his last visit.  He titrated up to 250 mg and notes that it worked quite well but then he ran out.  He has been out of it for some time.  He knows it did not make him feel tired.  Back pain: Chronic and ongoing.  Patient managed with naproxen as well as Lyrica.  Lyrica was started at the last visit and notes that this is been very helpful.  He like to continue it.  Depression:William Vincent notes ongoing hard to control depression.  His symptoms have remained the same or slightly worse recently.  He currently takes Celexa 40 mg daily.   ROS as above:  Exam:  BP 122/74   Pulse 62   Temp 98.4 F (36.9 C)   Wt 279 lb (126.6 kg)   BMI 40.03 kg/m  Wt Readings from Last 5 Encounters:  07/18/18 279 lb (126.6 kg)  05/13/18 275 lb (124.7 kg)  03/23/18 267 lb (121.1 kg)  12/21/17 253 lb (114.8 kg)  10/28/17 216 lb (98 kg)    Gen: Well NAD HEENT: EOMI,  MMM Lungs: Normal work of breathing. CTABL Heart: RRR no MRG Abd: NABS, Soft. Nondistended, Nontender Exts: Brisk capillary refill, warm and well perfused.  MSK: L-spine nontender to midline.  Decreased lumbar motion. Antalgic gait present. Psych: Alert and oriented normal speech thought process and affect.  No SI or HI expressed.  Depression screen [facility] 2/9 07/18/2018 05/13/2018 03/23/2018 07/14/2017 09/18/2016  Decreased Interest 3 3 1  0 0  Down, Depressed, Hopeless 3 3 1 2  0  PHQ - 2 Score 6 6 2 2  0  Altered sleeping 3 3 1 2 1   Tired, decreased energy 3 3 1 3 2   Change in  appetite 3 0 1 1 1   Feeling bad or failure about yourself  0 3 1 0 0  Trouble concentrating 2 0 0 1 0  Moving slowly or fidgety/restless 1 0 1 1 0  Suicidal thoughts 0 0 0 0 0  PHQ-9 Score 18 15 7 10 4   Difficult doing work/chores Very difficult Very difficult Somewhat difficult - -    Assessment and Plan: 53 y.o. male with hypertension: Blood pressure well controlled continue current regimen.  Renal function and electrolytes normal last checked recently.  Essential tremor: Tolerating primidone well.  Patient is run out.  We will switch back to 250 mg pills.  Will take 1/2 pill for 1 week then full dose pill after 1 week and recheck in about a month.  Back pain: Much better controlled with Lyrica.  Continue current regimen.  Mood: Slightly worsened.  Plan to continue citalopram and add Wellbutrin.  Recheck in 1 month via phone or video visit.  PDMP not reviewed this encounter. No orders of the defined types were placed in this encounter.  Meds ordered this encounter  Medications  . primidone (MYSOLINE) 250 MG tablet    Sig: Take 1 tablet (250 mg total) by mouth at bedtime. For tremor    Dispense:  90 tablet    Refill:  1  . lisinopril (ZESTRIL) 10 MG tablet  Sig: Take 1 tablet (10 mg total) by mouth daily. For blood pressure    Dispense:  90 tablet    Refill:  1  . furosemide (LASIX) 20 MG tablet    Sig: Take 1 tablet (20 mg total) by mouth daily. For leg swelling    Dispense:  90 tablet    Refill:  1  . atorvastatin (LIPITOR) 40 MG tablet    Sig: Take 1 tablet (40 mg total) by mouth daily. For cholesterol    Dispense:  90 tablet    Refill:  3  . buPROPion (WELLBUTRIN XL) 150 MG 24 hr tablet    Sig: Take 1 tablet (150 mg total) by mouth every morning. For depression    Dispense:  90 tablet    Refill:  1  . triamcinolone cream (KENALOG) 0.1 %    Sig: Apply 1 application topically 2 (two) times daily.    Dispense:  453.6 g    Refill:  12     Historical information  moved to improve visibility of documentation.  Past Medical History:  Diagnosis Date  . Gout   . Hypertension   . Kidney stones    Past Surgical History:  Procedure Laterality Date  . TONSILLECTOMY AND ADENOIDECTOMY     Social History   Tobacco Use  . Smoking status: Never Smoker  . Smokeless tobacco: Never Used  Substance Use Topics  . Alcohol use: No   family history is not on file.  Medications: Current Outpatient Medications  Medication Sig Dispense Refill  . amitriptyline (ELAVIL) 25 MG tablet Take 1 tablet (25 mg total) by mouth at bedtime as needed (pain). 90 tablet 1  . atorvastatin (LIPITOR) 40 MG tablet Take 1 tablet (40 mg total) by mouth daily. For cholesterol 90 tablet 3  . buPROPion (WELLBUTRIN XL) 150 MG 24 hr tablet Take 1 tablet (150 mg total) by mouth every morning. For depression 90 tablet 1  . citalopram (CELEXA) 40 MG tablet Take 1 tablet (40 mg total) by mouth daily. For depression 90 tablet 1  . diclofenac sodium (VOLTAREN) 1 % GEL Apply 4 g topically 4 (four) times daily. To affected joint. 100 g 11  . fluticasone (FLONASE) 50 MCG/ACT nasal spray Place 1 spray into both nostrils daily. 16 g 2  . furosemide (LASIX) 20 MG tablet Take 1 tablet (20 mg total) by mouth daily. For leg swelling 90 tablet 1  . hydrochlorothiazide (HYDRODIURIL) 25 MG tablet Take 1 tablet (25 mg total) by mouth daily. For blood pressure 90 tablet 2  . lidocaine (LIDODERM) 5 % Place 1 patch onto the skin daily. Remove & Discard patch within 12 hours or as directed by MD 30 patch 0  . lisinopril (ZESTRIL) 10 MG tablet Take 1 tablet (10 mg total) by mouth daily. For blood pressure 90 tablet 1  . methocarbamol (ROBAXIN) 500 MG tablet Take 1 tablet (500 mg total) by mouth 3 (three) times daily. 90 tablet 0  . naproxen (NAPROSYN) 500 MG tablet Take 1 tablet (500 mg total) by mouth 2 (two) times daily as needed for moderate pain. 180 tablet 1  . pregabalin (LYRICA) 75 MG capsule Take 1  capsule (75 mg total) by mouth 2 (two) times daily. 180 capsule 1  . primidone (MYSOLINE) 250 MG tablet Take 1 tablet (250 mg total) by mouth at bedtime. For tremor 90 tablet 1  . tizanidine (ZANAFLEX) 2 MG capsule Take 1 capsule (2 mg total) by mouth 3 (three) times  daily as needed for muscle spasms. 90 capsule 3  . triamcinolone cream (KENALOG) 0.1 % Apply 1 application topically 2 (two) times daily. 453.6 g 12   No current facility-administered medications for this visit.    Allergies  Allergen Reactions  . Flexeril [Cyclobenzaprine] Hives and Rash     Discussed warning signs or symptoms. Please see discharge instructions. Patient expresses understanding.

## 2018-07-20 DIAGNOSIS — M5442 Lumbago with sciatica, left side: Secondary | ICD-10-CM | POA: Diagnosis not present

## 2018-07-22 DIAGNOSIS — M5442 Lumbago with sciatica, left side: Secondary | ICD-10-CM | POA: Diagnosis not present

## 2018-07-25 DIAGNOSIS — M5442 Lumbago with sciatica, left side: Secondary | ICD-10-CM | POA: Diagnosis not present

## 2018-07-27 DIAGNOSIS — M5442 Lumbago with sciatica, left side: Secondary | ICD-10-CM | POA: Diagnosis not present

## 2018-07-29 DIAGNOSIS — M5442 Lumbago with sciatica, left side: Secondary | ICD-10-CM | POA: Diagnosis not present

## 2018-08-01 DIAGNOSIS — M5442 Lumbago with sciatica, left side: Secondary | ICD-10-CM | POA: Diagnosis not present

## 2018-08-03 DIAGNOSIS — M5442 Lumbago with sciatica, left side: Secondary | ICD-10-CM | POA: Diagnosis not present

## 2018-08-05 DIAGNOSIS — M5442 Lumbago with sciatica, left side: Secondary | ICD-10-CM | POA: Diagnosis not present

## 2018-08-08 DIAGNOSIS — M5442 Lumbago with sciatica, left side: Secondary | ICD-10-CM | POA: Diagnosis not present

## 2018-08-10 DIAGNOSIS — M5442 Lumbago with sciatica, left side: Secondary | ICD-10-CM | POA: Diagnosis not present

## 2018-08-12 DIAGNOSIS — M5442 Lumbago with sciatica, left side: Secondary | ICD-10-CM | POA: Diagnosis not present

## 2018-08-13 DIAGNOSIS — M5442 Lumbago with sciatica, left side: Secondary | ICD-10-CM | POA: Diagnosis not present

## 2018-08-14 DIAGNOSIS — M5442 Lumbago with sciatica, left side: Secondary | ICD-10-CM | POA: Diagnosis not present

## 2018-08-15 DIAGNOSIS — M5442 Lumbago with sciatica, left side: Secondary | ICD-10-CM | POA: Diagnosis not present

## 2018-08-16 DIAGNOSIS — M5442 Lumbago with sciatica, left side: Secondary | ICD-10-CM | POA: Diagnosis not present

## 2018-08-17 DIAGNOSIS — M5442 Lumbago with sciatica, left side: Secondary | ICD-10-CM | POA: Diagnosis not present

## 2018-08-18 ENCOUNTER — Telehealth: Payer: Self-pay | Admitting: Family Medicine

## 2018-08-18 ENCOUNTER — Ambulatory Visit (INDEPENDENT_AMBULATORY_CARE_PROVIDER_SITE_OTHER): Payer: Medicaid Other | Admitting: Family Medicine

## 2018-08-18 DIAGNOSIS — G8929 Other chronic pain: Secondary | ICD-10-CM | POA: Diagnosis not present

## 2018-08-18 DIAGNOSIS — Z7409 Other reduced mobility: Secondary | ICD-10-CM

## 2018-08-18 DIAGNOSIS — M5416 Radiculopathy, lumbar region: Secondary | ICD-10-CM

## 2018-08-18 DIAGNOSIS — M170 Bilateral primary osteoarthritis of knee: Secondary | ICD-10-CM

## 2018-08-18 DIAGNOSIS — N529 Male erectile dysfunction, unspecified: Secondary | ICD-10-CM

## 2018-08-18 DIAGNOSIS — F321 Major depressive disorder, single episode, moderate: Secondary | ICD-10-CM | POA: Diagnosis not present

## 2018-08-18 DIAGNOSIS — I10 Essential (primary) hypertension: Secondary | ICD-10-CM | POA: Diagnosis not present

## 2018-08-18 DIAGNOSIS — G250 Essential tremor: Secondary | ICD-10-CM | POA: Diagnosis not present

## 2018-08-18 DIAGNOSIS — M5441 Lumbago with sciatica, right side: Secondary | ICD-10-CM

## 2018-08-18 DIAGNOSIS — M5442 Lumbago with sciatica, left side: Secondary | ICD-10-CM | POA: Diagnosis not present

## 2018-08-18 MED ORDER — BUPROPION HCL ER (XL) 300 MG PO TB24: 300.0000 mg | ORAL_TABLET | ORAL | 1 refills | Status: DC

## 2018-08-18 MED ORDER — PREGABALIN 150 MG PO CAPS: 150.0000 mg | ORAL_CAPSULE | Freq: Two times a day (BID) | ORAL | 1 refills | Status: DC

## 2018-08-18 MED ORDER — PRIMIDONE 250 MG PO TABS: 250.0000 mg | ORAL_TABLET | Freq: Every day | ORAL | 1 refills | Status: DC

## 2018-08-18 MED ORDER — SILDENAFIL CITRATE 100 MG PO TABS: 50.0000 mg | ORAL_TABLET | Freq: Every day | ORAL | 11 refills | Status: AC | PRN

## 2018-08-18 MED ORDER — SILDENAFIL CITRATE 20 MG PO TABS: 40.0000 mg | ORAL_TABLET | ORAL | 11 refills | Status: DC | PRN

## 2018-08-18 MED FILL — NAPROXEN 500 MG TABLET: 500 | 90 days supply | Qty: 180 | Fill #1

## 2018-08-18 MED FILL — PREGABALIN 150 MG CAPS: 150 | 30 days supply | Qty: 60 | Fill #0

## 2018-08-18 MED FILL — PRIMIDONE 250 MG TABLET: 250 | 90 days supply | Qty: 90 | Fill #0

## 2018-08-18 MED FILL — ATORVASTATIN 40 MG TABLET: 40 | 30 days supply | Qty: 30 | Fill #1

## 2018-08-18 MED FILL — buPROPion HCL ER (XL) 300 M: 300 | 90 days supply | Qty: 90 | Fill #0

## 2018-08-18 MED FILL — tiZANidine HCL 2 MG TABS: 2 | 30 days supply | Qty: 90 | Fill #2

## 2018-08-18 NOTE — Telephone Encounter (Signed)
20 mg the dental sent to pharmacy.

## 2018-08-18 NOTE — Telephone Encounter (Signed)
Patient called the office and spoke with Janett Billow.that he was not able to pick up the Sildenafil. It looks like insurance will only pay for Sildenafil (Revatio) and not Sildenafil (Viagra). Is this ok to change? If so can you send a new prescription?

## 2018-08-18 NOTE — Telephone Encounter (Signed)
Called patient and left a message that med sent to pharmacy. KG LPN

## 2018-08-18 NOTE — Telephone Encounter (Signed)
I called [place] [facility] and Sildenafil is not covered under patients plan. Per Janett Billow the patient can pay out of pocket for the medication. Pharmacy is aware.   Reference number is [id number].   Left message for patient to call back as well.

## 2018-08-18 NOTE — Progress Notes (Signed)
Virtual Visit  I connected with      William Vincent  by a telemedicine application and verified that I am speaking with the correct person using two identifiers.   I discussed the limitations of evaluation and management by telemedicine and the availability of in person appointments. The patient expressed understanding and agreed to proceed.  History of Present Illness: William Vincent is a 53 y.o. male who would like to discuss mood, pain, erectile dysfunction.   Patient was seen 1 month ago.  At that time his depression had worsened.  He was continued on Celexa and Wellbutrin 150 was added.  He notes in the interim his mood has not changed.  He still has depression and anxiety symptoms.  He also in the past has been prescribed Lyrica for his chronic pain.  This had helped quite a bit however he currently is taking 75 mg twice daily.  He wants to know if increasing the Lyrica may help his pain.  He tolerates it well.  He is also having some issues with erectile dysfunction.  He notes this is been ongoing for years.  He has difficulty maintaining and sustaining an erection.  He is interested in Viagra if possible.    Observations/Objective:  Wt Readings from Last 5 Encounters:  07/18/18 279 lb (126.6 kg)  05/13/18 275 lb (124.7 kg)  03/23/18 267 lb (121.1 kg)  12/21/17 253 lb (114.8 kg)  10/28/17 216 lb (98 kg)   Exam: Normal Speech.  Psych alert and oriented normal speech and thought process.    Assessment and Plan: 53 y.o. male with  Mood: Depression not well controlled.  Plan to increase Wellbutrin to 300 mg daily and continue Celexa.  Recheck in 1 month.  If not improved may switch to SNRI therapy.  Chronic pain: Lumbago and radiculopathy.  Plan to increase Lyrica to 150 mg twice daily.  Additionally refer to home health physical therapy as this will help with strength and back pain.  Additionally we will prescribe trial of Viagra for erectile dysfunction.  Reassess in 1  month.  Refill primidone for essential tremor.  PDMP not reviewed this encounter. Orders Placed This Encounter  Procedures  . Ambulatory referral to Home Health    Referral Priority:   Routine    Referral Type:   Home Health Care    Referral Reason:   Specialty Services Required    Requested Specialty:   [place]    Number of Visits Requested:   1   Meds ordered this encounter  Medications  . buPROPion (WELLBUTRIN XL) 300 MG 24 hr tablet    Sig: Take 1 tablet (300 mg total) by mouth every morning. For depression    Dispense:  90 tablet    Refill:  1  . pregabalin (LYRICA) 150 MG capsule    Sig: Take 1 capsule (150 mg total) by mouth 2 (two) times daily.    Dispense:  180 capsule    Refill:  1  . primidone (MYSOLINE) 250 MG tablet    Sig: Take 1 tablet (250 mg total) by mouth at bedtime. For tremor    Dispense:  90 tablet    Refill:  1  . sildenafil (VIAGRA) 100 MG tablet    Sig: Take 0.5-1 tablets (50-100 mg total) by mouth daily as needed for erectile dysfunction.    Dispense:  30 tablet    Refill:  11    Follow Up Instructions:    I discussed  the assessment and treatment plan with the patient. The patient was provided an opportunity to ask questions and all were answered. The patient agreed with the plan and demonstrated an understanding of the instructions.   The patient was advised to call back or seek an in-person evaluation if the symptoms worsen or if the condition fails to improve as anticipated.  Time: 25 minutes of intraservice time, with >39 minutes of total time during today's visit.      Historical information moved to improve visibility of documentation.  Past Medical History:  Diagnosis Date  . Gout   . Hypertension   . Kidney stones    Past Surgical History:  Procedure Laterality Date  . TONSILLECTOMY AND ADENOIDECTOMY     Social History   Tobacco Use  . Smoking status: Never Smoker  . Smokeless tobacco: Never Used  Substance  Use Topics  . Alcohol use: No   family history is not on file.  Medications: Current Outpatient Medications  Medication Sig Dispense Refill  . amitriptyline (ELAVIL) 25 MG tablet Take 1 tablet (25 mg total) by mouth at bedtime as needed (pain). 90 tablet 1  . atorvastatin (LIPITOR) 40 MG tablet Take 1 tablet (40 mg total) by mouth daily. For cholesterol 90 tablet 3  . buPROPion (WELLBUTRIN XL) 300 MG 24 hr tablet Take 1 tablet (300 mg total) by mouth every morning. For depression 90 tablet 1  . citalopram (CELEXA) 40 MG tablet Take 1 tablet (40 mg total) by mouth daily. For depression 90 tablet 1  . diclofenac sodium (VOLTAREN) 1 % GEL Apply 4 g topically 4 (four) times daily. To affected joint. 100 g 11  . fluticasone (FLONASE) 50 MCG/ACT nasal spray Place 1 spray into both nostrils daily. 16 g 2  . furosemide (LASIX) 20 MG tablet Take 1 tablet (20 mg total) by mouth daily. For leg swelling 90 tablet 1  . hydrochlorothiazide (HYDRODIURIL) 25 MG tablet Take 1 tablet (25 mg total) by mouth daily. For blood pressure 90 tablet 2  . lidocaine (LIDODERM) 5 % Place 1 patch onto the skin daily. Remove & Discard patch within 12 hours or as directed by MD 30 patch 0  . lisinopril (ZESTRIL) 10 MG tablet Take 1 tablet (10 mg total) by mouth daily. For blood pressure 90 tablet 1  . methocarbamol (ROBAXIN) 500 MG tablet Take 1 tablet (500 mg total) by mouth 3 (three) times daily. 90 tablet 0  . naproxen (NAPROSYN) 500 MG tablet Take 1 tablet (500 mg total) by mouth 2 (two) times daily as needed for moderate pain. 180 tablet 1  . pregabalin (LYRICA) 150 MG capsule Take 1 capsule (150 mg total) by mouth 2 (two) times daily. 180 capsule 1  . primidone (MYSOLINE) 250 MG tablet Take 1 tablet (250 mg total) by mouth at bedtime. For tremor 90 tablet 1  . sildenafil (VIAGRA) 100 MG tablet Take 0.5-1 tablets (50-100 mg total) by mouth daily as needed for erectile dysfunction. 30 tablet 11  . tizanidine (ZANAFLEX) 2  MG capsule Take 1 capsule (2 mg total) by mouth 3 (three) times daily as needed for muscle spasms. 90 capsule 3  . triamcinolone cream (KENALOG) 0.1 % Apply 1 application topically 2 (two) times daily. 453.6 g 12   No current facility-administered medications for this visit.    Allergies  Allergen Reactions  . Flexeril [Cyclobenzaprine] Hives and Rash

## 2018-08-19 ENCOUNTER — Telehealth: Payer: Self-pay

## 2018-08-19 ENCOUNTER — Telehealth: Payer: Self-pay | Admitting: Family Medicine

## 2018-08-19 DIAGNOSIS — M5442 Lumbago with sciatica, left side: Secondary | ICD-10-CM | POA: Diagnosis not present

## 2018-08-19 MED ORDER — SILDENAFIL CITRATE 20 MG PO TABS: 40.0000 mg | ORAL_TABLET | ORAL | 11 refills | Status: DC | PRN

## 2018-08-19 NOTE — Telephone Encounter (Signed)
William Vincent's wife called and states the sildenafil 20 mg and the 100 mg needs a prior authorization through Medicaid. I advised that Medicaid probably will approve the prior authorization. I also told her that the sildenafil 20 mg was probably sent in just in case the 100 mg was not cover or cost too much. She states the 20 mg still cost $40 dollars. I told her I would send the prescription of the 20 mg to [facility] and send her a coupon through a text on her smart phone. The cost of the medication through [facility] at [facility] is $11.

## 2018-08-19 NOTE — Telephone Encounter (Signed)
Medications were sent yesterday by Dr Georgina Snell.

## 2018-08-19 NOTE — Telephone Encounter (Signed)
Per [name], she states that she is going to pick up ALL of patients medication and wants to make sure it is authorized or whatever needs to be done, they can not wait. Please contact and advise, she did not tell me what medication it was.

## 2018-08-20 DIAGNOSIS — M5442 Lumbago with sciatica, left side: Secondary | ICD-10-CM | POA: Diagnosis not present

## 2018-08-21 DIAGNOSIS — M5442 Lumbago with sciatica, left side: Secondary | ICD-10-CM | POA: Diagnosis not present

## 2018-08-22 DIAGNOSIS — M5442 Lumbago with sciatica, left side: Secondary | ICD-10-CM | POA: Diagnosis not present

## 2018-08-23 DIAGNOSIS — M5442 Lumbago with sciatica, left side: Secondary | ICD-10-CM | POA: Diagnosis not present

## 2018-08-24 DIAGNOSIS — M5442 Lumbago with sciatica, left side: Secondary | ICD-10-CM | POA: Diagnosis not present

## 2018-08-25 DIAGNOSIS — M5442 Lumbago with sciatica, left side: Secondary | ICD-10-CM | POA: Diagnosis not present

## 2018-08-26 DIAGNOSIS — M5442 Lumbago with sciatica, left side: Secondary | ICD-10-CM | POA: Diagnosis not present

## 2018-08-27 DIAGNOSIS — M5442 Lumbago with sciatica, left side: Secondary | ICD-10-CM | POA: Diagnosis not present

## 2018-08-28 DIAGNOSIS — M5442 Lumbago with sciatica, left side: Secondary | ICD-10-CM | POA: Diagnosis not present

## 2018-08-29 DIAGNOSIS — M5442 Lumbago with sciatica, left side: Secondary | ICD-10-CM | POA: Diagnosis not present

## 2018-08-30 DIAGNOSIS — M5442 Lumbago with sciatica, left side: Secondary | ICD-10-CM | POA: Diagnosis not present

## 2018-08-31 DIAGNOSIS — M5442 Lumbago with sciatica, left side: Secondary | ICD-10-CM | POA: Diagnosis not present

## 2018-09-01 DIAGNOSIS — M5442 Lumbago with sciatica, left side: Secondary | ICD-10-CM | POA: Diagnosis not present

## 2018-09-02 DIAGNOSIS — M5442 Lumbago with sciatica, left side: Secondary | ICD-10-CM | POA: Diagnosis not present

## 2018-09-03 DIAGNOSIS — M5442 Lumbago with sciatica, left side: Secondary | ICD-10-CM | POA: Diagnosis not present

## 2018-09-04 DIAGNOSIS — M5442 Lumbago with sciatica, left side: Secondary | ICD-10-CM | POA: Diagnosis not present

## 2018-09-05 DIAGNOSIS — M5442 Lumbago with sciatica, left side: Secondary | ICD-10-CM | POA: Diagnosis not present

## 2018-09-06 DIAGNOSIS — M5442 Lumbago with sciatica, left side: Secondary | ICD-10-CM | POA: Diagnosis not present

## 2018-09-07 DIAGNOSIS — M5442 Lumbago with sciatica, left side: Secondary | ICD-10-CM | POA: Diagnosis not present

## 2018-09-08 DIAGNOSIS — M5442 Lumbago with sciatica, left side: Secondary | ICD-10-CM | POA: Diagnosis not present

## 2018-09-09 DIAGNOSIS — M5442 Lumbago with sciatica, left side: Secondary | ICD-10-CM | POA: Diagnosis not present

## 2018-09-10 DIAGNOSIS — M5442 Lumbago with sciatica, left side: Secondary | ICD-10-CM | POA: Diagnosis not present

## 2018-09-11 DIAGNOSIS — M5442 Lumbago with sciatica, left side: Secondary | ICD-10-CM | POA: Diagnosis not present

## 2018-09-12 DIAGNOSIS — M5442 Lumbago with sciatica, left side: Secondary | ICD-10-CM | POA: Diagnosis not present

## 2018-09-13 DIAGNOSIS — M5442 Lumbago with sciatica, left side: Secondary | ICD-10-CM | POA: Diagnosis not present

## 2018-09-14 DIAGNOSIS — M5442 Lumbago with sciatica, left side: Secondary | ICD-10-CM | POA: Diagnosis not present

## 2018-09-15 DIAGNOSIS — M5442 Lumbago with sciatica, left side: Secondary | ICD-10-CM | POA: Diagnosis not present

## 2018-09-16 DIAGNOSIS — M5442 Lumbago with sciatica, left side: Secondary | ICD-10-CM | POA: Diagnosis not present

## 2018-09-17 DIAGNOSIS — M5442 Lumbago with sciatica, left side: Secondary | ICD-10-CM | POA: Diagnosis not present

## 2018-09-18 DIAGNOSIS — M5442 Lumbago with sciatica, left side: Secondary | ICD-10-CM | POA: Diagnosis not present

## 2018-09-19 DIAGNOSIS — M5442 Lumbago with sciatica, left side: Secondary | ICD-10-CM | POA: Diagnosis not present

## 2018-09-20 ENCOUNTER — Ambulatory Visit (INDEPENDENT_AMBULATORY_CARE_PROVIDER_SITE_OTHER): Payer: Medicaid Other | Admitting: Family Medicine

## 2018-09-20 ENCOUNTER — Other Ambulatory Visit: Payer: Self-pay

## 2018-09-20 ENCOUNTER — Encounter: Payer: Self-pay | Admitting: Family Medicine

## 2018-09-20 VITALS — Ht 70.0 in | Wt 279.0 lb

## 2018-09-20 DIAGNOSIS — F321 Major depressive disorder, single episode, moderate: Secondary | ICD-10-CM

## 2018-09-20 DIAGNOSIS — G8929 Other chronic pain: Secondary | ICD-10-CM | POA: Diagnosis not present

## 2018-09-20 DIAGNOSIS — M5442 Lumbago with sciatica, left side: Secondary | ICD-10-CM

## 2018-09-20 DIAGNOSIS — M4306 Spondylolysis, lumbar region: Secondary | ICD-10-CM

## 2018-09-20 DIAGNOSIS — M5441 Lumbago with sciatica, right side: Secondary | ICD-10-CM | POA: Diagnosis not present

## 2018-09-20 DIAGNOSIS — G250 Essential tremor: Secondary | ICD-10-CM | POA: Diagnosis not present

## 2018-09-20 MED ORDER — DULOXETINE HCL 30 MG PO CPEP: 30.0000 mg | ORAL_CAPSULE | Freq: Every day | ORAL | 1 refills | Status: DC

## 2018-09-20 NOTE — Patient Instructions (Addendum)
Thank you for coming in today. STOP celexa and wellbutrin.  Start Cymbalta daily.  Recheck with phone in 2 weeks. We will likely increase the cymbalta dose.   Recheck with phone visit in 2 weeks at 1pm.

## 2018-09-20 NOTE — Progress Notes (Signed)
Virtual Visit  I connected with      William Vincent  by a telemedicine application and verified that I am speaking with the correct person using two identifiers.   I discussed the limitations of evaluation and management by telemedicine and the availability of in person appointments. The patient expressed understanding and agreed to proceed.  History of Present Illness: William Vincent is a 53 y.o. male who would like to discuss mood. Not well controlled with celexa and wellbutrin.  He has been on Celexa 40 for some time and Wellbutrin was increased to 300.  He notes is not helping at all.  He continues to have chronic back pain.  He has been on Lyrica 150 now.  He notes that helps a little but is not fully controlled.  In the past he had hydrocodone which helped some as well.    He has been on primidone recently for essential tremor which has been helpful.    Observations/Objective: Ht 5\' 10"  (1.778 m)   Wt 279 lb (126.6 kg)   BMI 40.03 kg/m  Wt Readings from Last 5 Encounters:  09/20/18 279 lb (126.6 kg)  07/18/18 279 lb (126.6 kg)  05/13/18 275 lb (124.7 kg)  03/23/18 267 lb (121.1 kg)  12/21/17 253 lb (114.8 kg)   Exam: Normal Speech.  Psych: Alert and oriented normal speech thought process verbal affect.  No SI or HI expressed.  Depression screen [facility] 2/9 09/20/2018 07/18/2018 05/13/2018 03/23/2018 07/14/2017  Decreased Interest 3 3 3 1  0  Down, Depressed, Hopeless 3 3 3 1 2   PHQ - 2 Score 6 6 6 2 2   Altered sleeping 3 3 3 1 2   Tired, decreased energy 2 3 3 1 3   Change in appetite 2 3 0 1 1  Feeling bad or failure about yourself  3 0 3 1 0  Trouble concentrating 1 2 0 0 1  Moving slowly or fidgety/restless 0 1 0 1 1  Suicidal thoughts 3 0 0 0 0  PHQ-9 Score 20 18 15 7 10   Difficult doing work/chores Extremely dIfficult Very difficult Very difficult Somewhat difficult -   GAD 7 : Generalized Anxiety Score 09/20/2018 05/13/2018 07/14/2017 09/18/2016  Nervous, Anxious, on Edge 3 0 0 0   Control/stop worrying 3 3 3  0  Worry too much - different things 3 3 3  0  Trouble relaxing 3 3 3  0  Restless 2 3 3  0  Easily annoyed or irritable 3 3 2  0  Afraid - awful might happen 3 0 2 0  Total GAD 7 Score 20 15 16  0  Anxiety Difficulty Extremely difficult Very difficult - -     Lab and Radiology Results No results found for this or any previous visit (from the past 72 hour(s)). No results found.   Assessment and Plan: 53 y.o. male with  Mood: Not controlled.  Plan to abandon Celexa and Wellbutrin.  We will switch to Celebrex 30 mg.  Recheck in 2 weeks and likely proceed with titration from there.  Hopefully this will help with chronic pain as well.  Chronic pain: Marginally controlled with Lyrica.  Hopefully Celebrex will help.  Will consider restarting hydrocodone however he did not have great benefits from this in the past.  I am not sure that skin to be as helpful as it would really like it to be.  Essential tremor: Doing well with primidone  PDMP not reviewed this encounter. No orders of the defined types  were placed in this encounter.  No orders of the defined types were placed in this encounter.   Follow Up Instructions:    I discussed the assessment and treatment plan with the patient. The patient was provided an opportunity to ask questions and all were answered. The patient agreed with the plan and demonstrated an understanding of the instructions.   The patient was advised to call back or seek an in-person evaluation if the symptoms worsen or if the condition fails to improve as anticipated.  Time: 25 minutes of intraservice time, with >39 minutes of total time during today's visit.      Historical information moved to improve visibility of documentation.  Past Medical History:  Diagnosis Date  . Gout   . Hypertension   . Kidney stones    Past Surgical History:  Procedure Laterality Date  . TONSILLECTOMY AND ADENOIDECTOMY     Social History    Tobacco Use  . Smoking status: Never Smoker  . Smokeless tobacco: Never Used  Substance Use Topics  . Alcohol use: No   family history is not on file.  Medications: Current Outpatient Medications  Medication Sig Dispense Refill  . amitriptyline (ELAVIL) 25 MG tablet Take 1 tablet (25 mg total) by mouth at bedtime as needed (pain). 90 tablet 1  . atorvastatin (LIPITOR) 40 MG tablet Take 1 tablet (40 mg total) by mouth daily. For cholesterol 90 tablet 3  . buPROPion (WELLBUTRIN XL) 300 MG 24 hr tablet Take 1 tablet (300 mg total) by mouth every morning. For depression 90 tablet 1  . citalopram (CELEXA) 40 MG tablet Take 1 tablet (40 mg total) by mouth daily. For depression 90 tablet 1  . diclofenac sodium (VOLTAREN) 1 % GEL Apply 4 g topically 4 (four) times daily. To affected joint. 100 g 11  . fluticasone (FLONASE) 50 MCG/ACT nasal spray Place 1 spray into both nostrils daily. 16 g 2  . furosemide (LASIX) 20 MG tablet Take 1 tablet (20 mg total) by mouth daily. For leg swelling 90 tablet 1  . hydrochlorothiazide (HYDRODIURIL) 25 MG tablet Take 1 tablet (25 mg total) by mouth daily. For blood pressure 90 tablet 2  . lidocaine (LIDODERM) 5 % Place 1 patch onto the skin daily. Remove & Discard patch within 12 hours or as directed by MD 30 patch 0  . lisinopril (ZESTRIL) 10 MG tablet Take 1 tablet (10 mg total) by mouth daily. For blood pressure 90 tablet 1  . methocarbamol (ROBAXIN) 500 MG tablet Take 1 tablet (500 mg total) by mouth 3 (three) times daily. 90 tablet 0  . naproxen (NAPROSYN) 500 MG tablet Take 1 tablet (500 mg total) by mouth 2 (two) times daily as needed for moderate pain. 180 tablet 1  . pregabalin (LYRICA) 150 MG capsule Take 1 capsule (150 mg total) by mouth 2 (two) times daily. 180 capsule 1  . primidone (MYSOLINE) 250 MG tablet Take 1 tablet (250 mg total) by mouth at bedtime. For tremor 90 tablet 1  . sildenafil (REVATIO) 20 MG tablet Take 2-5 tablets (40-100 mg  total) by mouth as needed. 50 tablet 11  . sildenafil (VIAGRA) 100 MG tablet Take 0.5-1 tablets (50-100 mg total) by mouth daily as needed for erectile dysfunction. 30 tablet 11  . tizanidine (ZANAFLEX) 2 MG capsule Take 1 capsule (2 mg total) by mouth 3 (three) times daily as needed for muscle spasms. 90 capsule 3  . triamcinolone cream (KENALOG) 0.1 % Apply 1 application  topically 2 (two) times daily. 453.6 g 12   No current facility-administered medications for this visit.    Allergies  Allergen Reactions  . Flexeril [Cyclobenzaprine] Hives and Rash

## 2018-09-21 DIAGNOSIS — M5442 Lumbago with sciatica, left side: Secondary | ICD-10-CM | POA: Diagnosis not present

## 2018-09-22 DIAGNOSIS — M5442 Lumbago with sciatica, left side: Secondary | ICD-10-CM | POA: Diagnosis not present

## 2018-09-23 DIAGNOSIS — M5442 Lumbago with sciatica, left side: Secondary | ICD-10-CM | POA: Diagnosis not present

## 2018-09-24 DIAGNOSIS — M5442 Lumbago with sciatica, left side: Secondary | ICD-10-CM | POA: Diagnosis not present

## 2018-09-25 DIAGNOSIS — M5442 Lumbago with sciatica, left side: Secondary | ICD-10-CM | POA: Diagnosis not present

## 2018-09-26 DIAGNOSIS — M5442 Lumbago with sciatica, left side: Secondary | ICD-10-CM | POA: Diagnosis not present

## 2018-09-27 DIAGNOSIS — M5442 Lumbago with sciatica, left side: Secondary | ICD-10-CM | POA: Diagnosis not present

## 2018-09-28 DIAGNOSIS — M5442 Lumbago with sciatica, left side: Secondary | ICD-10-CM | POA: Diagnosis not present

## 2018-09-29 ENCOUNTER — Encounter: Payer: Self-pay | Admitting: Family Medicine

## 2018-09-29 DIAGNOSIS — M5442 Lumbago with sciatica, left side: Secondary | ICD-10-CM | POA: Diagnosis not present

## 2018-09-30 DIAGNOSIS — M5442 Lumbago with sciatica, left side: Secondary | ICD-10-CM | POA: Diagnosis not present

## 2018-10-01 DIAGNOSIS — M5442 Lumbago with sciatica, left side: Secondary | ICD-10-CM | POA: Diagnosis not present

## 2018-10-02 DIAGNOSIS — M5442 Lumbago with sciatica, left side: Secondary | ICD-10-CM | POA: Diagnosis not present

## 2018-10-03 DIAGNOSIS — M5442 Lumbago with sciatica, left side: Secondary | ICD-10-CM | POA: Diagnosis not present

## 2018-10-04 ENCOUNTER — Ambulatory Visit (INDEPENDENT_AMBULATORY_CARE_PROVIDER_SITE_OTHER): Payer: Medicaid Other | Admitting: Family Medicine

## 2018-10-04 ENCOUNTER — Encounter: Payer: Self-pay | Admitting: Family Medicine

## 2018-10-04 DIAGNOSIS — F321 Major depressive disorder, single episode, moderate: Secondary | ICD-10-CM

## 2018-10-04 DIAGNOSIS — G250 Essential tremor: Secondary | ICD-10-CM

## 2018-10-04 DIAGNOSIS — G8929 Other chronic pain: Secondary | ICD-10-CM

## 2018-10-04 DIAGNOSIS — M5442 Lumbago with sciatica, left side: Secondary | ICD-10-CM | POA: Diagnosis not present

## 2018-10-04 DIAGNOSIS — M5441 Lumbago with sciatica, right side: Secondary | ICD-10-CM

## 2018-10-04 MED ORDER — VENLAFAXINE HCL ER 75 MG PO CP24: 75.0000 mg | ORAL_CAPSULE | Freq: Every day | ORAL | 1 refills | Status: DC

## 2018-10-04 NOTE — Progress Notes (Signed)
Virtual Visit  I connected with      William Vincent  by a telemedicine application and verified that I am speaking with the correct person using two identifiers.   I discussed the limitations of evaluation and management by telemedicine and the availability of in person appointments. The patient expressed understanding and agreed to proceed.  History of Present Illness: William Vincent is a 53 y.o. male who would like to discuss mood.   William Vincent was seen 2 weeks ago for mood.  He was struggling and not well controlled with high doses of Celexa and Wellbutrin.  At that point we plan to discontinue both Celexa and Wellbutrin and switch to Celebrex.  Started at 30 mg with plans at today's visit to titrate.  In the interim he notes that his mood has not changed.  He thinks the Cymbalta is caused some stomach upset.  He also notes his tremor has been improved with primidone to 50 but not fully controlled.  He is receiving home health physical therapy for his back pain/leg pain and leg weakness which does help some.     Observations/Objective: There were no vitals taken for this visit. Wt Readings from Last 5 Encounters:  09/20/18 279 lb (126.6 kg)  07/18/18 279 lb (126.6 kg)  05/13/18 275 lb (124.7 kg)  03/23/18 267 lb (121.1 kg)  12/21/17 253 lb (114.8 kg)   Exam: Normal Speech.    Depression screen [facility] 2/9 10/04/2018 09/20/2018 07/18/2018 05/13/2018 03/23/2018  Decreased Interest 3 3 3 3 1   Down, Depressed, Hopeless 3 3 3 3 1   PHQ - 2 Score 6 6 6 6 2   Altered sleeping 3 3 3 3 1   Tired, decreased energy 2 2 3 3 1   Change in appetite 2 2 3  0 1  Feeling bad or failure about yourself  3 3 0 3 1  Trouble concentrating 2 1 2  0 0  Moving slowly or fidgety/restless 1 0 1 0 1  Suicidal thoughts 2 3 0 0 0  PHQ-9 Score 21 20 18 15 7   Difficult doing work/chores Somewhat difficult Extremely dIfficult Very difficult Very difficult Somewhat difficult  Some recent data might be hidden   GAD 7 :  Generalized Anxiety Score 10/04/2018 09/20/2018 05/13/2018 07/14/2017  Nervous, Anxious, on Edge 2 3 0 0  Control/stop worrying 2 3 3 3   Worry too much - different things 2 3 3 3   Trouble relaxing 2 3 3 3   Restless 2 2 3 3   Easily annoyed or irritable 2 3 3 2   Afraid - awful might happen 2 3 0 2  Total GAD 7 Score 14 20 15 16   Anxiety Difficulty Extremely difficult Extremely difficult Very difficult -     Lab and Radiology Results No results found for this or any previous visit (from the past 62 hour(s)). No results found.   Assessment and Plan: 54 y.o. male with  Mood: Not controlled.  Intolerant of Cymbalta.  Will try fixer and check back via video/phone visit in 2 weeks.  Tremor: Not controlled with tramadol to 50.  Increase to 500 and recheck in 2 weeks.  Pain and weakness: Continue physical therapy.  PDMP not reviewed this encounter. No orders of the defined types were placed in this encounter.  Meds ordered this encounter  Medications  . venlafaxine XR (EFFEXOR XR) 75 MG 24 hr capsule    Sig: Take 1 capsule (75 mg total) by mouth daily with breakfast.  Dispense:  30 capsule    Refill:  1    Replaces cymbalta    Follow Up Instructions:    I discussed the assessment and treatment plan with the patient. The patient was provided an opportunity to ask questions and all were answered. The patient agreed with the plan and demonstrated an understanding of the instructions.   The patient was advised to call back or seek an in-person evaluation if the symptoms worsen or if the condition fails to improve as anticipated.  Time: 15 minutes of intraservice time, with >22 minutes of total time during today's visit.      Historical information moved to improve visibility of documentation.  Past Medical History:  Diagnosis Date  . Gout   . Hypertension   . Kidney stones    Past Surgical History:  Procedure Laterality Date  . TONSILLECTOMY AND ADENOIDECTOMY     Social  History   Tobacco Use  . Smoking status: Never Smoker  . Smokeless tobacco: Never Used  Substance Use Topics  . Alcohol use: No   family history is not on file.  Medications: Current Outpatient Medications  Medication Sig Dispense Refill  . amitriptyline (ELAVIL) 25 MG tablet Take 1 tablet (25 mg total) by mouth at bedtime as needed (pain). 90 tablet 1  . atorvastatin (LIPITOR) 40 MG tablet Take 1 tablet (40 mg total) by mouth daily. For cholesterol 90 tablet 3  . diclofenac sodium (VOLTAREN) 1 % GEL Apply 4 g topically 4 (four) times daily. To affected joint. 100 g 11  . fluticasone (FLONASE) 50 MCG/ACT nasal spray Place 1 spray into both nostrils daily. 16 g 2  . furosemide (LASIX) 20 MG tablet Take 1 tablet (20 mg total) by mouth daily. For leg swelling 90 tablet 1  . hydrochlorothiazide (HYDRODIURIL) 25 MG tablet Take 1 tablet (25 mg total) by mouth daily. For blood pressure 90 tablet 2  . lidocaine (LIDODERM) 5 % Place 1 patch onto the skin daily. Remove & Discard patch within 12 hours or as directed by MD 30 patch 0  . methocarbamol (ROBAXIN) 500 MG tablet Take 1 tablet (500 mg total) by mouth 3 (three) times daily. 90 tablet 0  . naproxen (NAPROSYN) 500 MG tablet Take 1 tablet (500 mg total) by mouth 2 (two) times daily as needed for moderate pain. 180 tablet 1  . pregabalin (LYRICA) 150 MG capsule Take 1 capsule (150 mg total) by mouth 2 (two) times daily. 180 capsule 1  . primidone (MYSOLINE) 250 MG tablet Take 1 tablet (250 mg total) by mouth at bedtime. For tremor 90 tablet 1  . sildenafil (VIAGRA) 100 MG tablet Take 0.5-1 tablets (50-100 mg total) by mouth daily as needed for erectile dysfunction. 30 tablet 11  . tizanidine (ZANAFLEX) 2 MG capsule Take 1 capsule (2 mg total) by mouth 3 (three) times daily as needed for muscle spasms. 90 capsule 3  . triamcinolone cream (KENALOG) 0.1 % Apply 1 application topically 2 (two) times daily. 453.6 g 12  . venlafaxine XR (EFFEXOR XR)  75 MG 24 hr capsule Take 1 capsule (75 mg total) by mouth daily with breakfast. 30 capsule 1   No current facility-administered medications for this visit.    Allergies  Allergen Reactions  . Flexeril [Cyclobenzaprine] Hives and Rash

## 2018-10-04 NOTE — Patient Instructions (Addendum)
Thank you for coming in today. STOP cymbalta.  START effexor.  Increase primidrone for tremor.  Recheck in 2 weeks  As scheduled for phone call.

## 2018-10-05 DIAGNOSIS — M5442 Lumbago with sciatica, left side: Secondary | ICD-10-CM | POA: Diagnosis not present

## 2018-10-06 DIAGNOSIS — M5442 Lumbago with sciatica, left side: Secondary | ICD-10-CM | POA: Diagnosis not present

## 2018-10-07 DIAGNOSIS — M5442 Lumbago with sciatica, left side: Secondary | ICD-10-CM | POA: Diagnosis not present

## 2018-10-08 DIAGNOSIS — M5442 Lumbago with sciatica, left side: Secondary | ICD-10-CM | POA: Diagnosis not present

## 2018-10-09 DIAGNOSIS — M5442 Lumbago with sciatica, left side: Secondary | ICD-10-CM | POA: Diagnosis not present

## 2018-10-10 DIAGNOSIS — M5442 Lumbago with sciatica, left side: Secondary | ICD-10-CM | POA: Diagnosis not present

## 2018-10-11 ENCOUNTER — Other Ambulatory Visit: Payer: Self-pay | Admitting: Family Medicine

## 2018-10-11 DIAGNOSIS — M5442 Lumbago with sciatica, left side: Secondary | ICD-10-CM | POA: Diagnosis not present

## 2018-10-11 MED FILL — tiZANidine HCL 2 MG TABS: 2 | 30 days supply | Qty: 90 | Fill #0

## 2018-10-11 MED FILL — ATORVASTATIN 40 MG TABLET: 40 | 90 days supply | Qty: 90 | Fill #0

## 2018-10-11 MED FILL — FUROSEMIDE 20 MG TABS: 20 | 90 days supply | Qty: 90 | Fill #0

## 2018-10-11 MED FILL — FLUTICASONE PROP 50 MCG SPR: 50 | 60 days supply | Qty: 16 | Fill #0

## 2018-10-11 MED FILL — LISINOPRIL 10 MG TABS: 10 | 30 days supply | Qty: 30 | Fill #0

## 2018-10-11 MED FILL — HYDROCHLOROTHIAZIDE 25 MG T: 25 | 30 days supply | Qty: 30 | Fill #0

## 2018-10-11 MED FILL — DICLOFENAC SODIUM 1 % GEL: 1 | 7 days supply | Qty: 100 | Fill #0

## 2018-10-11 MED FILL — AMITRIPTYLINE HCL 25 MG TAB: 25 | 90 days supply | Qty: 90 | Fill #0

## 2018-10-11 MED FILL — PROPRANOLOL HCL ER 60 MG CP: 60 | 90 days supply | Qty: 90 | Fill #0

## 2018-10-12 DIAGNOSIS — M5442 Lumbago with sciatica, left side: Secondary | ICD-10-CM | POA: Diagnosis not present

## 2018-10-13 DIAGNOSIS — M5442 Lumbago with sciatica, left side: Secondary | ICD-10-CM | POA: Diagnosis not present

## 2018-10-14 DIAGNOSIS — M5442 Lumbago with sciatica, left side: Secondary | ICD-10-CM | POA: Diagnosis not present

## 2018-10-15 DIAGNOSIS — M5442 Lumbago with sciatica, left side: Secondary | ICD-10-CM | POA: Diagnosis not present

## 2018-10-16 DIAGNOSIS — M5442 Lumbago with sciatica, left side: Secondary | ICD-10-CM | POA: Diagnosis not present

## 2018-10-17 DIAGNOSIS — M5442 Lumbago with sciatica, left side: Secondary | ICD-10-CM | POA: Diagnosis not present

## 2018-10-18 ENCOUNTER — Ambulatory Visit (INDEPENDENT_AMBULATORY_CARE_PROVIDER_SITE_OTHER): Payer: Medicaid Other | Admitting: Family Medicine

## 2018-10-18 ENCOUNTER — Encounter: Payer: Self-pay | Admitting: Family Medicine

## 2018-10-18 VITALS — Ht 70.0 in | Wt 279.0 lb

## 2018-10-18 DIAGNOSIS — Z7409 Other reduced mobility: Secondary | ICD-10-CM | POA: Diagnosis not present

## 2018-10-18 DIAGNOSIS — M5442 Lumbago with sciatica, left side: Secondary | ICD-10-CM | POA: Diagnosis not present

## 2018-10-18 DIAGNOSIS — G8929 Other chronic pain: Secondary | ICD-10-CM | POA: Diagnosis not present

## 2018-10-18 DIAGNOSIS — F321 Major depressive disorder, single episode, moderate: Secondary | ICD-10-CM

## 2018-10-18 DIAGNOSIS — G250 Essential tremor: Secondary | ICD-10-CM | POA: Diagnosis not present

## 2018-10-18 DIAGNOSIS — M5441 Lumbago with sciatica, right side: Secondary | ICD-10-CM | POA: Diagnosis not present

## 2018-10-18 MED ORDER — DICLOFENAC SODIUM 1 % TD GEL: TRANSDERMAL | 11 refills | Status: DC

## 2018-10-18 MED ORDER — PRIMIDONE 250 MG PO TABS: 500.0000 mg | ORAL_TABLET | Freq: Every day | ORAL | 1 refills | Status: DC

## 2018-10-18 MED ORDER — FLUTICASONE PROPIONATE 50 MCG/ACT NA SUSP: 1.0000 | Freq: Every day | NASAL | 2 refills | Status: DC

## 2018-10-18 MED ORDER — CELECOXIB 200 MG PO CAPS: ORAL_CAPSULE | ORAL | 2 refills | Status: DC

## 2018-10-18 MED ORDER — PREGABALIN 150 MG PO CAPS: 150.0000 mg | ORAL_CAPSULE | Freq: Two times a day (BID) | ORAL | 1 refills | Status: DC

## 2018-10-18 MED ORDER — AMBULATORY NON FORMULARY MEDICATION: 0 refills | Status: AC

## 2018-10-18 MED ORDER — VENLAFAXINE HCL ER 150 MG PO CP24: 150.0000 mg | ORAL_CAPSULE | Freq: Every day | ORAL | 1 refills | Status: DC

## 2018-10-18 MED FILL — CELECOXIB 200 MG CAP: 200 | 90 days supply | Qty: 180 | Fill #0

## 2018-10-18 MED FILL — PRIMIDONE 250 MG TABLET: 250 | 90 days supply | Qty: 180 | Fill #0

## 2018-10-18 MED FILL — VENLAFAXINE HCL ER 150 MG C: 150 | 30 days supply | Qty: 30 | Fill #0

## 2018-10-18 NOTE — Progress Notes (Signed)
Virtual Visit  I connected with      William Vincent  by a telemedicine application and verified that I am speaking with the correct person using two identifiers.   I discussed the limitations of evaluation and management by telemedicine and the availability of in person appointments. The patient expressed understanding and agreed to proceed.  History of Present Illness: William MouldDannie Bond is a 53 y.o. male who would like to discuss mood.  Patient had difficult to control anxiety and depression symptoms.  He was not doing well with Celexa and Wellbutrin and was switched to Cymbalta.  He had trouble tolerating it and at the last visit 2 weeks ago was switched to Effexor or 75 mg daily.  In the interim he notes that his mood is not any better but he is able at least tolerate the medication a lot better.  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  This was increased to 500 mg at the last visit.  In the interim he notes that his tremor has improved a bit   He has been doing physical therapy for back pain and leg weakness.  He notes this is slightly helpful.  He notes that he thinks he do better with a rolling walker if able.  He like prescription for that.  He notes that naproxen is not very helpful for his back pain did like to switch to a different NSAID if possible.  He also needs refills of some of his chronic medications as well.  Observations/Objective: Ht 5\' 10"  (1.778 m)   Wt 279 lb (126.6 kg)   BMI 40.03 kg/m  Wt Readings from Last 5 Encounters:  10/18/18 279 lb (126.6 kg)  09/20/18 279 lb (126.6 kg)  07/18/18 279 lb (126.6 kg)  05/13/18 275 lb (124.7 kg)  03/23/18 267 lb (121.1 kg)   Exam: Normal Speech.  Psych alert and oriented normal speech thought process and affect.  [facility] 10/18/2018 10/04/2018 09/20/2018 07/18/2018 05/13/2018  Decreased Interest 3 3 3 3 3   Down, Depressed, Hopeless 3 3 3 3 3   PHQ - 2 Score 6 6 6 6 6    Altered sleeping 3 3 3 3 3   Tired, decreased energy 2 2 2 3 3   Change in appetite 3 2 2 3  0  Feeling bad or failure about yourself  3 3 3  0 3  Trouble concentrating 3 2 1 2  0  Moving slowly or fidgety/restless 3 1 0 1 0  Suicidal thoughts 2 2 3  0 0  PHQ-9 Score 25 21 20 18 15   Difficult doing work/chores Very difficult Somewhat difficult Extremely dIfficult Very difficult Very difficult  Some recent data might be hidden   GAD 7 : Generalized Anxiety Score 10/18/2018 10/04/2018 09/20/2018 05/13/2018  Nervous, Anxious, on Edge 3 2 3  0  Control/stop worrying 3 2 3 3   Worry too much - different things 3 2 3 3   Trouble relaxing 3 2 3 3   Restless 3 2 2 3   Easily annoyed or irritable 3 2 3 3   Afraid - awful might happen 3 2 3  0  Total GAD 7 Score 21 14 20 15   Anxiety Difficulty Extremely difficult Extremely difficult Extremely difficult Very difficult     Lab and Radiology Results No results found for this or any previous visit (from the past 72 hour(s)). No results found.   Assessment and Plan: 53 y.o. male with  Depression: Not well controlled.  Plan to increase Effexor to 150 mg daily.  Recheck in 1 month via phone or in person visit.  Chronic back pain: Not controlled very well.  Plan to continue physical therapy.  Will use a rolling walker.  We will switch from naproxen to Celebrex and recheck in about a month.  Tremor: Slight improvement.  Continue current dose of primidone.  New prescription sent.  Refill chronic medications.  Check back 1 month.  PDMP not reviewed this encounter. No orders of the defined types were placed in this encounter.  Meds ordered this encounter  Medications  . venlafaxine XR (EFFEXOR-XR) 150 MG 24 hr capsule    Sig: Take 1 capsule (150 mg total) by mouth daily with breakfast.    Dispense:  30 capsule    Refill:  1  . primidone (MYSOLINE) 250 MG tablet    Sig: Take 2 tablets (500 mg total) by mouth at bedtime. For tremor    Dispense:  180  tablet    Refill:  1  . celecoxib (CELEBREX) 200 MG capsule    Sig: One to 2 tablets by mouth daily as needed for pain.    Dispense:  180 capsule    Refill:  2  . pregabalin (LYRICA) 150 MG capsule    Sig: Take 1 capsule (150 mg total) by mouth 2 (two) times daily.    Dispense:  180 capsule    Refill:  1  . fluticasone (FLONASE) 50 MCG/ACT nasal spray    Sig: Place 1 spray into both nostrils daily.    Dispense:  16 g    Refill:  2  . diclofenac sodium (VOLTAREN) 1 % GEL    Sig: APPLY 4 GRAMS TOPICALLY 4 TIMES DAILY TO AFFECTED JOINT    Dispense:  100 g    Refill:  11  . AMBULATORY NON FORMULARY MEDICATION    Sig: Rolling Walker. Use as needed for leg weakness and back pain.  Disp #1 Z74.09    Dispense:  1 each    Refill:  0    Follow Up Instructions:    I discussed the assessment and treatment plan with the patient. The patient was provided an opportunity to ask questions and all were answered. The patient agreed with the plan and demonstrated an understanding of the instructions.   The patient was advised to call back or seek an in-person evaluation if the symptoms worsen or if the condition fails to improve as anticipated.  Time: 15 minutes of intraservice time, with >22 minutes of total time during today's visit.      Historical information moved to improve visibility of documentation.  Past Medical History:  Diagnosis Date  . Gout   . Hypertension   . Kidney stones    Past Surgical History:  Procedure Laterality Date  . TONSILLECTOMY AND ADENOIDECTOMY     Social History   Tobacco Use  . Smoking status: Never Smoker  . Smokeless tobacco: Never Used  Substance Use Topics  . Alcohol use: No   family history is not on file.  Medications: Current Outpatient Medications  Medication Sig Dispense Refill  . amitriptyline (ELAVIL) 25 MG tablet TAKE 1 TABLET (25 MG TOTAL) BY MOUTH AT BEDTIME AS NEEDED (PAIN). 90 tablet 1  . atorvastatin (LIPITOR) 40 MG tablet  Take 1 tablet (40 mg total) by mouth daily. For cholesterol 90 tablet 3  . diclofenac sodium (VOLTAREN) 1 % GEL APPLY 4 GRAMS TOPICALLY 4 TIMES DAILY TO AFFECTED JOINT 100 g  11  . fluticasone (FLONASE) 50 MCG/ACT nasal spray Place 1 spray into both nostrils daily. 16 g 2  . furosemide (LASIX) 20 MG tablet Take 1 tablet (20 mg total) by mouth daily. For leg swelling 90 tablet 1  . hydrochlorothiazide (HYDRODIURIL) 25 MG tablet Take 1 tablet (25 mg total) by mouth daily. For blood pressure 90 tablet 2  . lidocaine (LIDODERM) 5 % Place 1 patch onto the skin daily. Remove & Discard patch within 12 hours or as directed by MD 30 patch 0  . methocarbamol (ROBAXIN) 500 MG tablet Take 1 tablet (500 mg total) by mouth 3 (three) times daily. 90 tablet 0  . pregabalin (LYRICA) 150 MG capsule Take 1 capsule (150 mg total) by mouth 2 (two) times daily. 180 capsule 1  . primidone (MYSOLINE) 250 MG tablet Take 2 tablets (500 mg total) by mouth at bedtime. For tremor 180 tablet 1  . sildenafil (VIAGRA) 100 MG tablet Take 0.5-1 tablets (50-100 mg total) by mouth daily as needed for erectile dysfunction. 30 tablet 11  . tizanidine (ZANAFLEX) 2 MG capsule Take 1 capsule (2 mg total) by mouth 3 (three) times daily as needed for muscle spasms. 90 capsule 3  . triamcinolone cream (KENALOG) 0.1 % Apply 1 application topically 2 (two) times daily. 453.6 g 12  . venlafaxine XR (EFFEXOR-XR) 150 MG 24 hr capsule Take 1 capsule (150 mg total) by mouth daily with breakfast. 30 capsule 1  . AMBULATORY NON FORMULARY MEDICATION Rolling Walker. Use as needed for leg weakness and back pain.  Disp #1 Z74.09 1 each 0  . celecoxib (CELEBREX) 200 MG capsule One to 2 tablets by mouth daily as needed for pain. 180 capsule 2   No current facility-administered medications for this visit.    Allergies  Allergen Reactions  . Flexeril [Cyclobenzaprine] Hives and Rash

## 2018-10-19 ENCOUNTER — Telehealth: Payer: Self-pay | Admitting: Family Medicine

## 2018-10-19 DIAGNOSIS — M5442 Lumbago with sciatica, left side: Secondary | ICD-10-CM | POA: Diagnosis not present

## 2018-10-19 NOTE — Telephone Encounter (Signed)
Left voicemail with information below. Let patient know to call us back to schedule an office visit or a virtual.  ° °

## 2018-10-19 NOTE — Telephone Encounter (Signed)
-----   Message from Gregor Hams, MD sent at 10/18/2018 12:54 PM EDT ----- Regarding: 1 month 1 month follow up mood, and HTN, and shoulder pain. Phone or in person

## 2018-10-20 DIAGNOSIS — M5442 Lumbago with sciatica, left side: Secondary | ICD-10-CM | POA: Diagnosis not present

## 2018-10-21 DIAGNOSIS — M5442 Lumbago with sciatica, left side: Secondary | ICD-10-CM | POA: Diagnosis not present

## 2018-10-22 DIAGNOSIS — M5442 Lumbago with sciatica, left side: Secondary | ICD-10-CM | POA: Diagnosis not present

## 2018-10-23 DIAGNOSIS — M5442 Lumbago with sciatica, left side: Secondary | ICD-10-CM | POA: Diagnosis not present

## 2018-10-24 DIAGNOSIS — M5442 Lumbago with sciatica, left side: Secondary | ICD-10-CM | POA: Diagnosis not present

## 2018-10-25 DIAGNOSIS — M5442 Lumbago with sciatica, left side: Secondary | ICD-10-CM | POA: Diagnosis not present

## 2018-10-26 DIAGNOSIS — M5442 Lumbago with sciatica, left side: Secondary | ICD-10-CM | POA: Diagnosis not present

## 2018-10-27 DIAGNOSIS — M5442 Lumbago with sciatica, left side: Secondary | ICD-10-CM | POA: Diagnosis not present

## 2018-10-28 DIAGNOSIS — M5442 Lumbago with sciatica, left side: Secondary | ICD-10-CM | POA: Diagnosis not present

## 2018-10-29 DIAGNOSIS — M5442 Lumbago with sciatica, left side: Secondary | ICD-10-CM | POA: Diagnosis not present

## 2018-10-29 MED FILL — PREGABALIN 150 MG CAPS: 150 | 30 days supply | Qty: 60 | Fill #0

## 2018-10-30 DIAGNOSIS — M5442 Lumbago with sciatica, left side: Secondary | ICD-10-CM | POA: Diagnosis not present

## 2018-10-31 DIAGNOSIS — M5442 Lumbago with sciatica, left side: Secondary | ICD-10-CM | POA: Diagnosis not present

## 2018-11-01 DIAGNOSIS — M5442 Lumbago with sciatica, left side: Secondary | ICD-10-CM | POA: Diagnosis not present

## 2018-11-02 DIAGNOSIS — M5442 Lumbago with sciatica, left side: Secondary | ICD-10-CM | POA: Diagnosis not present

## 2018-11-03 DIAGNOSIS — M5442 Lumbago with sciatica, left side: Secondary | ICD-10-CM | POA: Diagnosis not present

## 2018-11-04 DIAGNOSIS — M5442 Lumbago with sciatica, left side: Secondary | ICD-10-CM | POA: Diagnosis not present

## 2018-11-05 DIAGNOSIS — M5442 Lumbago with sciatica, left side: Secondary | ICD-10-CM | POA: Diagnosis not present

## 2018-11-06 DIAGNOSIS — M5442 Lumbago with sciatica, left side: Secondary | ICD-10-CM | POA: Diagnosis not present

## 2018-11-07 DIAGNOSIS — M5442 Lumbago with sciatica, left side: Secondary | ICD-10-CM | POA: Diagnosis not present

## 2018-11-08 DIAGNOSIS — M5442 Lumbago with sciatica, left side: Secondary | ICD-10-CM | POA: Diagnosis not present

## 2018-11-09 ENCOUNTER — Telehealth: Payer: Self-pay | Admitting: Family Medicine

## 2018-11-09 ENCOUNTER — Ambulatory Visit (INDEPENDENT_AMBULATORY_CARE_PROVIDER_SITE_OTHER): Payer: 59 | Admitting: Family Medicine

## 2018-11-09 DIAGNOSIS — J302 Other seasonal allergic rhinitis: Secondary | ICD-10-CM | POA: Diagnosis not present

## 2018-11-09 DIAGNOSIS — M5442 Lumbago with sciatica, left side: Secondary | ICD-10-CM | POA: Diagnosis not present

## 2018-11-09 DIAGNOSIS — K047 Periapical abscess without sinus: Secondary | ICD-10-CM | POA: Diagnosis not present

## 2018-11-09 MED ORDER — AMOXICILLIN 500 MG PO CAPS: 1000.0000 mg | ORAL_CAPSULE | Freq: Two times a day (BID) | ORAL | 0 refills | Status: DC

## 2018-11-09 MED ORDER — HYDROCODONE-ACETAMINOPHEN 5-325 MG PO TABS: 1.0000 | ORAL_TABLET | Freq: Four times a day (QID) | ORAL | 0 refills | Status: DC | PRN

## 2018-11-09 MED ORDER — CETIRIZINE HCL 10 MG PO TABS: 10.0000 mg | ORAL_TABLET | Freq: Every day | ORAL | 11 refills | Status: AC

## 2018-11-09 NOTE — Telephone Encounter (Signed)
[name] calling in stating that patient isn't feeling good and wanted to know if his penicillin can be filled and something for his allergies. If it can be called in she wants it sent to [address], [place],  [id number] . Please advise.

## 2018-11-09 NOTE — Telephone Encounter (Signed)
Left a message to return a call to schedule an appointment.   

## 2018-11-09 NOTE — Telephone Encounter (Signed)
Happy to do a phone visit

## 2018-11-09 NOTE — Progress Notes (Signed)
Virtual Visit  I connected with      William Vincent  by a telemedicine application and verified that I am speaking with the correct person using two identifiers.   I discussed the limitations of evaluation and management by telemedicine and the availability of in person appointments. The patient expressed understanding and agreed to proceed.  History of Present Illness: William Vincent is a 53 y.o. male who would like to discuss right upper tooth pain.  Patient has multiple dental caries and notes several teeth in the right upper side of his jaw are painful swollen.  He has had similar episodes before like this.  He has an appointment scheduled next week with his dentist.  In the past he is done pretty well with antibiotics and would like a antibiotic prescription if possible.  He is tried Tylenol and ibuprofen which is not been sufficient to control his pain.  Additionally he like a limited supply of hydrocodone if possible as that helped in the past as well.  He also notes sinus pain and pressure and ear pressure.  He attributes this to allergies.  He is tried Flonase nasal spray which does not help much.  He has not tried over-the-counter antihistamines yet.       Observations/Objective: There were no vitals taken for this visit. Wt Readings from Last 5 Encounters:  10/18/18 279 lb (126.6 kg)  09/20/18 279 lb (126.6 kg)  07/18/18 279 lb (126.6 kg)  05/13/18 275 lb (124.7 kg)  03/23/18 267 lb (121.1 kg)   Exam: Normal Speech.    Lab and Radiology Results No results found for this or any previous visit (from the past 72 hour(s)). No results found.   Assessment and Plan: 53 y.o. male with dental pain.  Dental caries with dental infection.  Treat with amoxicillin.  Limited hydrocodone for pain control.  Recheck if not improving.  Follow-up with dentist ASAP.  Allergy symptoms: Pain and pressure and ear congestion.  Prescribed cetirizine.  This is also available over-the-counter.   Recheck back if needed.  PDMP reviewed during this encounter. No orders of the defined types were placed in this encounter.  Meds ordered this encounter  Medications  . amoxicillin (AMOXIL) 500 MG capsule    Sig: Take 2 capsules (1,000 mg total) by mouth 2 (two) times daily.    Dispense:  40 capsule    Refill:  0  . cetirizine (ZYRTEC) 10 MG tablet    Sig: Take 1 tablet (10 mg total) by mouth daily.    Dispense:  30 tablet    Refill:  11  . HYDROcodone-acetaminophen (NORCO/VICODIN) 5-325 MG tablet    Sig: Take 1 tablet by mouth every 6 (six) hours as needed.    Dispense:  15 tablet    Refill:  0    Follow Up Instructions:    I discussed the assessment and treatment plan with the patient. The patient was provided an opportunity to ask questions and all were answered. The patient agreed with the plan and demonstrated an understanding of the instructions.   The patient was advised to call back or seek an in-person evaluation if the symptoms worsen or if the condition fails to improve as anticipated.  Time: 15 minutes of intraservice time, with >22 minutes of total time during today's visit.      Historical information moved to improve visibility of documentation.  Past Medical History:  Diagnosis Date  . Gout   . Hypertension   .  Kidney stones    Past Surgical History:  Procedure Laterality Date  . TONSILLECTOMY AND ADENOIDECTOMY     Social History   Tobacco Use  . Smoking status: Never Smoker  . Smokeless tobacco: Never Used  Substance Use Topics  . Alcohol use: No   family history is not on file.  Medications: Current Outpatient Medications  Medication Sig Dispense Refill  . AMBULATORY NON FORMULARY MEDICATION Rolling Walker. Use as needed for leg weakness and back pain.  Disp #1 Z74.09 1 each 0  . amitriptyline (ELAVIL) 25 MG tablet TAKE 1 TABLET (25 MG TOTAL) BY MOUTH AT BEDTIME AS NEEDED (PAIN). 90 tablet 1  . amoxicillin (AMOXIL) 500 MG capsule Take 2  capsules (1,000 mg total) by mouth 2 (two) times daily. 40 capsule 0  . atorvastatin (LIPITOR) 40 MG tablet Take 1 tablet (40 mg total) by mouth daily. For cholesterol 90 tablet 3  . celecoxib (CELEBREX) 200 MG capsule One to 2 tablets by mouth daily as needed for pain. 180 capsule 2  . cetirizine (ZYRTEC) 10 MG tablet Take 1 tablet (10 mg total) by mouth daily. 30 tablet 11  . diclofenac sodium (VOLTAREN) 1 % GEL APPLY 4 GRAMS TOPICALLY 4 TIMES DAILY TO AFFECTED JOINT 100 g 11  . furosemide (LASIX) 20 MG tablet Take 1 tablet (20 mg total) by mouth daily. For leg swelling 90 tablet 1  . hydrochlorothiazide (HYDRODIURIL) 25 MG tablet Take 1 tablet (25 mg total) by mouth daily. For blood pressure 90 tablet 2  . HYDROcodone-acetaminophen (NORCO/VICODIN) 5-325 MG tablet Take 1 tablet by mouth every 6 (six) hours as needed. 15 tablet 0  . lidocaine (LIDODERM) 5 % Place 1 patch onto the skin daily. Remove & Discard patch within 12 hours or as directed by MD 30 patch 0  . methocarbamol (ROBAXIN) 500 MG tablet Take 1 tablet (500 mg total) by mouth 3 (three) times daily. 90 tablet 0  . pregabalin (LYRICA) 150 MG capsule Take 1 capsule (150 mg total) by mouth 2 (two) times daily. 180 capsule 1  . primidone (MYSOLINE) 250 MG tablet Take 2 tablets (500 mg total) by mouth at bedtime. For tremor 180 tablet 1  . sildenafil (VIAGRA) 100 MG tablet Take 0.5-1 tablets (50-100 mg total) by mouth daily as needed for erectile dysfunction. 30 tablet 11  . tizanidine (ZANAFLEX) 2 MG capsule Take 1 capsule (2 mg total) by mouth 3 (three) times daily as needed for muscle spasms. 90 capsule 3  . triamcinolone cream (KENALOG) 0.1 % Apply 1 application topically 2 (two) times daily. 453.6 g 12  . venlafaxine XR (EFFEXOR-XR) 150 MG 24 hr capsule Take 1 capsule (150 mg total) by mouth daily with breakfast. 30 capsule 1   No current facility-administered medications for this visit.    Allergies  Allergen Reactions  . Flexeril  [Cyclobenzaprine] Hives and Rash

## 2018-11-09 NOTE — Telephone Encounter (Signed)
Called and spoke with [name]. Reports William Vincent is having some sinus concerns and tooth pain. They do not have a BP machine or thermometer at home to get vitals. Questions if they can still do a telephone visit of it Pt needs to come in to get the refill.  Routing to PCP for recommendation.

## 2018-11-10 ENCOUNTER — Telehealth: Payer: Self-pay | Admitting: Family Medicine

## 2018-11-10 DIAGNOSIS — M5442 Lumbago with sciatica, left side: Secondary | ICD-10-CM | POA: Diagnosis not present

## 2018-11-10 MED ORDER — CLINDAMYCIN HCL 300 MG PO CAPS: 300.0000 mg | ORAL_CAPSULE | Freq: Three times a day (TID) | ORAL | 0 refills | Status: DC

## 2018-11-10 NOTE — Addendum Note (Signed)
Addended by: Gregor Hams on: 11/10/2018 12:32 PM   Modules accepted: Orders

## 2018-11-10 NOTE — Telephone Encounter (Signed)
Sent clindamycin antibiotic to [facility].

## 2018-11-10 NOTE — Telephone Encounter (Signed)
Left pt msg advising RX at pharmacy  

## 2018-11-10 NOTE — Telephone Encounter (Signed)
PT called asking about his teeth. The medication that was sent in isn't getting the infection out. He asked if there was anything that could take care of the situation faster. He can't go to the dentist till next week.   Please advise.

## 2018-11-11 ENCOUNTER — Telehealth: Payer: Self-pay

## 2018-11-11 DIAGNOSIS — M5442 Lumbago with sciatica, left side: Secondary | ICD-10-CM | POA: Diagnosis not present

## 2018-11-11 NOTE — Telephone Encounter (Signed)
Patient calls concerning worsening tooth ache. States that there is a lot of pus, very painful sinuses, and facial swelling. He is not able to get into his regular dentist (Dr Weyman Pedro in [place]) until Wednesday and has trouble finding someone who accepts Medicaid.   Prior to sending patient to ER, I advised him to call [facility] at [phone] to see if they accept his insurance and can see him today. Patient advised to call me back with update

## 2018-11-11 NOTE — Telephone Encounter (Signed)
Patient

## 2018-11-11 NOTE — Telephone Encounter (Addendum)
Pt has been advised to seek care with a dentist (see phone note from 11/11/2018). He is scheduled for an appointment today with [facility] in [facility]. No further concerns.

## 2018-11-12 DIAGNOSIS — M5442 Lumbago with sciatica, left side: Secondary | ICD-10-CM | POA: Diagnosis not present

## 2018-11-13 DIAGNOSIS — M5442 Lumbago with sciatica, left side: Secondary | ICD-10-CM | POA: Diagnosis not present

## 2018-11-14 DIAGNOSIS — M5442 Lumbago with sciatica, left side: Secondary | ICD-10-CM | POA: Diagnosis not present

## 2018-11-15 ENCOUNTER — Encounter: Payer: Self-pay | Admitting: Family Medicine

## 2018-11-15 ENCOUNTER — Other Ambulatory Visit: Payer: Self-pay

## 2018-11-15 ENCOUNTER — Ambulatory Visit (INDEPENDENT_AMBULATORY_CARE_PROVIDER_SITE_OTHER): Payer: 59 | Admitting: Family Medicine

## 2018-11-15 VITALS — BP 139/101 | HR 85 | Temp 97.9°F | Wt 290.0 lb

## 2018-11-15 DIAGNOSIS — Z23 Encounter for immunization: Secondary | ICD-10-CM

## 2018-11-15 DIAGNOSIS — H9201 Otalgia, right ear: Secondary | ICD-10-CM

## 2018-11-15 DIAGNOSIS — K047 Periapical abscess without sinus: Secondary | ICD-10-CM | POA: Diagnosis not present

## 2018-11-15 DIAGNOSIS — M5442 Lumbago with sciatica, left side: Secondary | ICD-10-CM | POA: Diagnosis not present

## 2018-11-15 MED ORDER — CELECOXIB 200 MG PO CAPS: ORAL_CAPSULE | ORAL | 2 refills | Status: DC

## 2018-11-15 MED ORDER — CLINDAMYCIN HCL 300 MG PO CAPS: 300.0000 mg | ORAL_CAPSULE | Freq: Three times a day (TID) | ORAL | 0 refills | Status: DC

## 2018-11-15 MED ORDER — HYDROCODONE-ACETAMINOPHEN 5-325 MG PO TABS: 1.0000 | ORAL_TABLET | Freq: Four times a day (QID) | ORAL | 0 refills | Status: DC | PRN

## 2018-11-15 MED ORDER — PREDNISONE 10 MG PO TABS: 30.0000 mg | ORAL_TABLET | Freq: Every day | ORAL | 0 refills | Status: DC

## 2018-11-15 MED ORDER — VENLAFAXINE HCL ER 150 MG PO CP24: 150.0000 mg | ORAL_CAPSULE | Freq: Every day | ORAL | 1 refills | Status: DC

## 2018-11-15 MED FILL — CELECOXIB 200 MG CAP: 200 | 90 days supply | Qty: 180 | Fill #0

## 2018-11-15 MED FILL — HYDROCODON-APAP 5-325: 5-325 | 4 days supply | Qty: 15 | Fill #0

## 2018-11-15 MED FILL — VENLAFAXINE HCL ER 150 MG C: 150 | 90 days supply | Qty: 90 | Fill #0

## 2018-11-15 MED FILL — predniSONE 10 MG TABS: 10 | 5 days supply | Qty: 15 | Fill #0

## 2018-11-15 MED FILL — CLINDAMYCIN HCL 300 MG CAPS: 300 | 7 days supply | Qty: 21 | Fill #0

## 2018-11-15 NOTE — Progress Notes (Signed)
William Vincent is a 53 y.o. male who [facility] today for ear pain and jaw pain.  Patient had 4 teeth removed from the right upper jaw and one tooth removed from the right lower jaw last week on September 3.  He was treated with clindamycin and notes pain has continued.  He notes ear pressure and pain bilaterally and right-sided jaw pain and swelling.  He denies fevers or chills nausea vomiting or diarrhea.    ROS as above:  Exam:  BP (!) 139/101   Pulse 85   Temp 97.9 F (36.6 C) (Oral)   Wt 290 lb (131.5 kg)   BMI 41.61 kg/m  Wt Readings from Last 5 Encounters:  11/15/18 290 lb (131.5 kg)  10/18/18 279 lb (126.6 kg)  09/20/18 279 lb (126.6 kg)  07/18/18 279 lb (126.6 kg)  05/13/18 275 lb (124.7 kg)    Gen: Well NAD HEENT: EOMI,  MMM right tympanic membrane normal-appearing left tympanic membrane retracted.  Well-appearing oral sockets right upper jaw with some surrounding erythema.  No expressible pus or abscess formation.  Jaw is mildly tender.  No severe cervical lymphadenopathy.  Normal jaw motion. Lungs: Normal work of breathing. CTABL Heart: RRR no MRG Abd: NABS, Soft. Nondistended, Nontender Exts: Brisk capillary refill, warm and well perfused.   Lab and Radiology Results No results found for this or any previous visit (from the past 72 hour(s)). No results found.    Assessment and Plan: 53 y.o. male with resolving dental infection with retracted tympanic membrane.  Plan to continue clindamycin course.  Will also use short course of prednisone.  Refill chronic medications.  Influenza vaccine given today.   I informed patient that I am transitioning to sports medicine only [facility] in [place] starting in November.  Happy to see patient for continued sports medicine needs.  Discussed need for new PCP.  Provided some  recommendations.  PDMP reviewed during this encounter. Orders Placed This Encounter  Procedures  . Flu Vaccine QUAD 36+ mos IM   Meds ordered this encounter  Medications  . clindamycin (CLEOCIN) 300 MG capsule    Sig: Take 1 capsule (300 mg total) by mouth 3 (three) times daily.    Dispense:  21 capsule    Refill:  0  . predniSONE (DELTASONE) 10 MG tablet    Sig: Take 3 tablets (30 mg total) by mouth daily with breakfast.    Dispense:  15 tablet    Refill:  0  . HYDROcodone-acetaminophen (NORCO/VICODIN) 5-325 MG tablet    Sig: Take 1 tablet by mouth every 6 (six) hours as needed.    Dispense:  15 tablet    Refill:  0  . venlafaxine XR (EFFEXOR-XR) 150 MG 24 hr capsule    Sig: Take 1 capsule (150 mg total) by mouth daily with breakfast.    Dispense:  90 capsule    Refill:  1  . celecoxib (CELEBREX) 200 MG capsule    Sig: One to 2 tablets by mouth daily as needed for pain.    Dispense:  180 capsule    Refill:  2     Historical information moved to improve visibility of documentation.  Past Medical History:  Diagnosis Date  . Gout   . Hypertension   . Kidney stones    Past Surgical History:  Procedure Laterality Date  . TONSILLECTOMY AND ADENOIDECTOMY     Social History  Tobacco Use  . Smoking status: Never Smoker  . Smokeless tobacco: Never Used  Substance Use Topics  . Alcohol use: No   family history is not on file.  Medications: Current Outpatient Medications  Medication Sig Dispense Refill  . AMBULATORY NON FORMULARY MEDICATION Rolling Walker. Use as needed for leg weakness and back pain.  Disp #1 Z74.09 1 each 0  . amitriptyline (ELAVIL) 25 MG tablet TAKE 1 TABLET (25 MG TOTAL) BY MOUTH AT BEDTIME AS NEEDED (PAIN). 90 tablet 1  . atorvastatin (LIPITOR) 40 MG tablet Take 1 tablet (40 mg total) by mouth daily. For cholesterol 90 tablet 3  . celecoxib (CELEBREX) 200 MG capsule One to 2 tablets by mouth daily as needed for pain. 180 capsule 2  .  cetirizine (ZYRTEC) 10 MG tablet Take 1 tablet (10 mg total) by mouth daily. 30 tablet 11  . clindamycin (CLEOCIN) 300 MG capsule Take 1 capsule (300 mg total) by mouth 3 (three) times daily. 21 capsule 0  . diclofenac sodium (VOLTAREN) 1 % GEL APPLY 4 GRAMS TOPICALLY 4 TIMES DAILY TO AFFECTED JOINT 100 g 11  . furosemide (LASIX) 20 MG tablet Take 1 tablet (20 mg total) by mouth daily. For leg swelling 90 tablet 1  . hydrochlorothiazide (HYDRODIURIL) 25 MG tablet Take 1 tablet (25 mg total) by mouth daily. For blood pressure 90 tablet 2  . HYDROcodone-acetaminophen (NORCO/VICODIN) 5-325 MG tablet Take 1 tablet by mouth every 6 (six) hours as needed. 15 tablet 0  . lidocaine (LIDODERM) 5 % Place 1 patch onto the skin daily. Remove & Discard patch within 12 hours or as directed by MD 30 patch 0  . methocarbamol (ROBAXIN) 500 MG tablet Take 1 tablet (500 mg total) by mouth 3 (three) times daily. 90 tablet 0  . pregabalin (LYRICA) 150 MG capsule Take 1 capsule (150 mg total) by mouth 2 (two) times daily. 180 capsule 1  . primidone (MYSOLINE) 250 MG tablet Take 2 tablets (500 mg total) by mouth at bedtime. For tremor 180 tablet 1  . sildenafil (VIAGRA) 100 MG tablet Take 0.5-1 tablets (50-100 mg total) by mouth daily as needed for erectile dysfunction. 30 tablet 11  . tizanidine (ZANAFLEX) 2 MG capsule Take 1 capsule (2 mg total) by mouth 3 (three) times daily as needed for muscle spasms. 90 capsule 3  . triamcinolone cream (KENALOG) 0.1 % Apply 1 application topically 2 (two) times daily. 453.6 g 12  . venlafaxine XR (EFFEXOR-XR) 150 MG 24 hr capsule Take 1 capsule (150 mg total) by mouth daily with breakfast. 90 capsule 1  . predniSONE (DELTASONE) 10 MG tablet Take 3 tablets (30 mg total) by mouth daily with breakfast. 15 tablet 0   No current facility-administered medications for this visit.    Allergies  Allergen Reactions  . Flexeril [Cyclobenzaprine] Hives and Rash     Discussed warning  signs or symptoms. Please see discharge instructions. Patient expresses understanding.

## 2018-11-15 NOTE — Patient Instructions (Signed)
Thank you for coming in today.  Continue the clindamycin for 1 more week.  OK to stop Amoxicillin.  Take prednisone for 5 days.  Use hydrocodone sparingly.   Recheck as needed.    I will be moving to full time Sports Medicine in [place] starting on November 1st.  You will still be able to see me for your Sports Medicine or Orthopedic needs at [facility] in [place]. I will still be part of [facility] [place].    If you want to stay locally for your Sports Medicine issues Dr. Dianah Field here in [place] will be happy to see you.  Additionally Dr. Clearance Coots at [facility] will be happy to see you for sports medicine issues more locally.   For your primary care needs you are welcome to establish care with Dr. Emeterio Reeve.  We are working quickly to hire more physicians to cover the primary care needs however if you cannot get an appointment with Dr. Sheppard Coil in a timely manner [place] has locations and openings for primary care services nearby.   [facility] at [facility] [address] . [employer] , [place]: [phone] . Behavioral Medicine: [phone] . Fax: [place] at [employer] [address] . [place], [place]: [phone] . Behavioral Medicine: [phone] . Fax: [phone] . Hours (M-F): 7am - Academic librarian At [facility]. [place], [place]: [phone] . Behavioral Medicine: [phone] . Fax: [phone] . Hours (M-F): 8am - Optician, dispensing at [employer] . [place], [place] Phone: [phone] . Behavioral Medicine: [phone] . Fax: [phone]

## 2018-11-16 DIAGNOSIS — M5442 Lumbago with sciatica, left side: Secondary | ICD-10-CM | POA: Diagnosis not present

## 2018-11-17 DIAGNOSIS — M5442 Lumbago with sciatica, left side: Secondary | ICD-10-CM | POA: Diagnosis not present

## 2018-11-18 DIAGNOSIS — M5442 Lumbago with sciatica, left side: Secondary | ICD-10-CM | POA: Diagnosis not present

## 2018-11-19 DIAGNOSIS — M5442 Lumbago with sciatica, left side: Secondary | ICD-10-CM | POA: Diagnosis not present

## 2018-11-20 DIAGNOSIS — M5442 Lumbago with sciatica, left side: Secondary | ICD-10-CM | POA: Diagnosis not present

## 2018-11-28 DIAGNOSIS — M5442 Lumbago with sciatica, left side: Secondary | ICD-10-CM | POA: Diagnosis not present

## 2018-11-29 DIAGNOSIS — M5442 Lumbago with sciatica, left side: Secondary | ICD-10-CM | POA: Diagnosis not present

## 2018-11-30 DIAGNOSIS — M5442 Lumbago with sciatica, left side: Secondary | ICD-10-CM | POA: Diagnosis not present

## 2018-12-01 DIAGNOSIS — M5442 Lumbago with sciatica, left side: Secondary | ICD-10-CM | POA: Diagnosis not present

## 2018-12-02 DIAGNOSIS — M5442 Lumbago with sciatica, left side: Secondary | ICD-10-CM | POA: Diagnosis not present

## 2018-12-03 DIAGNOSIS — M5442 Lumbago with sciatica, left side: Secondary | ICD-10-CM | POA: Diagnosis not present

## 2018-12-04 DIAGNOSIS — M5442 Lumbago with sciatica, left side: Secondary | ICD-10-CM | POA: Diagnosis not present

## 2018-12-05 DIAGNOSIS — M5442 Lumbago with sciatica, left side: Secondary | ICD-10-CM | POA: Diagnosis not present

## 2018-12-06 DIAGNOSIS — M5442 Lumbago with sciatica, left side: Secondary | ICD-10-CM | POA: Diagnosis not present

## 2018-12-07 DIAGNOSIS — M5442 Lumbago with sciatica, left side: Secondary | ICD-10-CM | POA: Diagnosis not present

## 2018-12-13 ENCOUNTER — Ambulatory Visit: Payer: 59 | Admitting: Family Medicine

## 2018-12-15 ENCOUNTER — Ambulatory Visit: Payer: 59 | Admitting: Family Medicine

## 2018-12-15 NOTE — Progress Notes (Deleted)
William Vincent is a 53 y.o. male who presents to [facility] today for ***   ROS as above:  Exam:  There were no vitals taken for this visit. Wt Readings from Last 5 Encounters:  11/15/18 290 lb (131.5 kg)  10/18/18 279 lb (126.6 kg)  09/20/18 279 lb (126.6 kg)  07/18/18 279 lb (126.6 kg)  05/13/18 275 lb (124.7 kg)    Gen: Well NAD HEENT: EOMI,  MMM Lungs: Normal work of breathing. CTABL Heart: RRR no MRG Abd: NABS, Soft. Nondistended, Nontender Exts: Brisk capillary refill, warm and well perfused.   Lab and Radiology Results No results found for this or any previous visit (from the past 72 hour(s)). No results found.    Assessment and Plan: 53 y.o. male with ***  PDMP not reviewed this encounter. No orders of the defined types were placed in this encounter.  No orders of the defined types were placed in this encounter.    Historical information moved to improve visibility of documentation.  Past Medical History:  Diagnosis Date  . Gout   . Hypertension   . Kidney stones    Past Surgical History:  Procedure Laterality Date  . TONSILLECTOMY AND ADENOIDECTOMY     Social History   Tobacco Use  . Smoking status: Never Smoker  . Smokeless tobacco: Never Used  Substance Use Topics  . Alcohol use: No   family history is not on file.  Medications: Current Outpatient Medications  Medication Sig Dispense Refill  . AMBULATORY NON FORMULARY MEDICATION Rolling Walker. Use as needed for leg weakness and back pain.  Disp #1 Z74.09 1 each 0  . amitriptyline (ELAVIL) 25 MG tablet TAKE 1 TABLET (25 MG TOTAL) BY MOUTH AT BEDTIME AS NEEDED (PAIN). 90 tablet 1  . atorvastatin (LIPITOR) 40 MG tablet Take 1 tablet (40 mg total) by mouth daily. For cholesterol 90 tablet 3  . celecoxib (CELEBREX) 200 MG capsule One to 2 tablets by mouth daily as needed  for pain. 180 capsule 2  . cetirizine (ZYRTEC) 10 MG tablet Take 1 tablet (10 mg total) by mouth daily. 30 tablet 11  . clindamycin (CLEOCIN) 300 MG capsule Take 1 capsule (300 mg total) by mouth 3 (three) times daily. 21 capsule 0  . diclofenac sodium (VOLTAREN) 1 % GEL APPLY 4 GRAMS TOPICALLY 4 TIMES DAILY TO AFFECTED JOINT 100 g 11  . furosemide (LASIX) 20 MG tablet Take 1 tablet (20 mg total) by mouth daily. For leg swelling 90 tablet 1  . hydrochlorothiazide (HYDRODIURIL) 25 MG tablet Take 1 tablet (25 mg total) by mouth daily. For blood pressure 90 tablet 2  . HYDROcodone-acetaminophen (NORCO/VICODIN) 5-325 MG tablet Take 1 tablet by mouth every 6 (six) hours as needed. 15 tablet 0  . lidocaine (LIDODERM) 5 % Place 1 patch onto the skin daily. Remove & Discard patch within 12 hours or as directed by MD 30 patch 0  . methocarbamol (ROBAXIN) 500 MG tablet Take 1 tablet (500 mg total) by mouth 3 (three) times daily. 90 tablet 0  . predniSONE (DELTASONE) 10 MG tablet Take 3 tablets (30 mg total) by mouth daily with breakfast. 15 tablet 0  . pregabalin (LYRICA) 150 MG capsule Take 1 capsule (150 mg total) by mouth 2 (two) times daily. 180 capsule 1  . primidone (MYSOLINE) 250 MG tablet Take 2 tablets (500 mg total) by mouth at bedtime. For tremor 180 tablet 1  .  sildenafil (VIAGRA) 100 MG tablet Take 0.5-1 tablets (50-100 mg total) by mouth daily as needed for erectile dysfunction. 30 tablet 11  . tizanidine (ZANAFLEX) 2 MG capsule Take 1 capsule (2 mg total) by mouth 3 (three) times daily as needed for muscle spasms. 90 capsule 3  . triamcinolone cream (KENALOG) 0.1 % Apply 1 application topically 2 (two) times daily. 453.6 g 12  . venlafaxine XR (EFFEXOR-XR) 150 MG 24 hr capsule Take 1 capsule (150 mg total) by mouth daily with breakfast. 90 capsule 1   No current facility-administered medications for this visit.    Allergies  Allergen Reactions  . Flexeril [Cyclobenzaprine] Hives and Rash      Discussed warning signs or symptoms. Please see discharge instructions. Patient expresses understanding.

## 2018-12-16 ENCOUNTER — Ambulatory Visit: Payer: 59 | Admitting: Family Medicine

## 2018-12-30 ENCOUNTER — Ambulatory Visit: Payer: 59 | Admitting: Family Medicine

## 2019-01-02 ENCOUNTER — Ambulatory Visit: Payer: 59 | Admitting: Family Medicine

## 2019-01-02 ENCOUNTER — Ambulatory Visit (INDEPENDENT_AMBULATORY_CARE_PROVIDER_SITE_OTHER): Payer: 59 | Admitting: Family Medicine

## 2019-01-02 ENCOUNTER — Other Ambulatory Visit: Payer: Self-pay

## 2019-01-02 DIAGNOSIS — M5442 Lumbago with sciatica, left side: Secondary | ICD-10-CM

## 2019-01-02 DIAGNOSIS — F321 Major depressive disorder, single episode, moderate: Secondary | ICD-10-CM | POA: Diagnosis not present

## 2019-01-02 DIAGNOSIS — M4306 Spondylolysis, lumbar region: Secondary | ICD-10-CM

## 2019-01-02 DIAGNOSIS — G250 Essential tremor: Secondary | ICD-10-CM

## 2019-01-02 DIAGNOSIS — I10 Essential (primary) hypertension: Secondary | ICD-10-CM

## 2019-01-02 DIAGNOSIS — M5441 Lumbago with sciatica, right side: Secondary | ICD-10-CM

## 2019-01-02 DIAGNOSIS — G8929 Other chronic pain: Secondary | ICD-10-CM

## 2019-01-02 MED ORDER — CELECOXIB 200 MG PO CAPS: ORAL_CAPSULE | ORAL | 2 refills | Status: DC

## 2019-01-02 MED ORDER — VENLAFAXINE HCL ER 150 MG PO CP24: 150.0000 mg | ORAL_CAPSULE | Freq: Every day | ORAL | 1 refills | Status: AC

## 2019-01-02 MED ORDER — NAPROXEN 500 MG PO TABS: 500.0000 mg | ORAL_TABLET | Freq: Two times a day (BID) | ORAL | 4 refills | Status: DC

## 2019-01-02 MED ORDER — PREGABALIN 200 MG PO CAPS: 200.0000 mg | ORAL_CAPSULE | Freq: Two times a day (BID) | ORAL | 1 refills | Status: DC

## 2019-01-02 MED ORDER — TIZANIDINE HCL 2 MG PO CAPS: 2.0000 mg | ORAL_CAPSULE | Freq: Three times a day (TID) | ORAL | 3 refills | Status: DC | PRN

## 2019-01-02 MED ORDER — HYDROCHLOROTHIAZIDE 25 MG PO TABS: 25.0000 mg | ORAL_TABLET | Freq: Every day | ORAL | 2 refills | Status: DC

## 2019-01-02 MED ORDER — ATORVASTATIN CALCIUM 40 MG PO TABS: 40.0000 mg | ORAL_TABLET | Freq: Every day | ORAL | 3 refills | Status: AC

## 2019-01-02 NOTE — Progress Notes (Signed)
Virtual Visit  I connected with      William Vincent  by a telemedicine application and verified that I am speaking with the correct person using two identifiers.   I discussed the limitations of evaluation and management by telemedicine and the availability of in person appointments. The patient expressed understanding and agreed to proceed.  History of Present Illness: William Vincent is a 53 y.o. male who would like to discuss tremor, shoulder pain, mood, hypertension.  Patient has a history of essential tremor.  He previously had trials of propranolol which did not help much.  He was started on primidone and currently titrated up to 250.  Last visit we plan to increase to 500 after he was still having symptoms.  He notes that he was unaware that he was supposed to increase the dose and is still been taking 250 milligrams at night.  He notes very bothersome hand tremor interfering with ability to grasp and hold objects at times.  Additionally he developed right shoulder pain about 2 weeks ago.  He woke up with a feeling like his shoulder was dislocated or out of joint.  The shoulder ease back into place and has been bothering him since.  He notes pain with overhead motion reaching back.  Pain is located primarily in the shoulder and lateral upper arm.  He denies any injury.  Additionally he notes continued bothersome depressive symptoms.  He takes venlafaxine and notes that he needs a refill.  Symptoms are moderately controlled.  Hypertension is managed with hydrochlorothiazide.  Patient does not have a home blood pressure cuff.  Patient has chronic back pain.  He notes Lyrica has been somewhat helpful but would like to increase the dose if possible.  Additionally he uses tizanidine intermittently which helps some.      Observations/Objective: There were no vitals taken for this visit. Wt Readings from Last 5 Encounters:  11/15/18 290 lb (131.5 kg)  10/18/18 279 lb (126.6 kg)  09/20/18  279 lb (126.6 kg)  07/18/18 279 lb (126.6 kg)  05/13/18 275 lb (124.7 kg)   Exam: Normal Speech.   Psych: Alert and oriented normal speech thought process verbal affect.  No SI or HI.    Lab and Radiology Results No results found for this or any previous visit (from the past 72 hour(s)). No results found.   Assessment and Plan: 53 y.o. male with  Essential tremor.  Plan to increase primidone to 500 mg at night.  Recheck in about a month.  Hypertension: Continue current regimen.  Shoulder pain: Follow-up with me in 1 month and sports medicine.  Chronic back pain: Increase Lyrica.  Continue tizanidine.  Check back 1 month or so.  Mood: Continue Effexor.    PDMP not reviewed this encounter. No orders of the defined types were placed in this encounter.  No orders of the defined types were placed in this encounter.   Follow Up Instructions:    I discussed the assessment and treatment plan with the patient. The patient was provided an opportunity to ask questions and all were answered. The patient agreed with the plan and demonstrated an understanding of the instructions.   The patient was advised to call back or seek an in-person evaluation if the symptoms worsen or if the condition fails to improve as anticipated.  Time: 15 minutes of intraservice time, with >22 minutes of total time during today's visit.      Historical information moved to improve visibility of  documentation.  Past Medical History:  Diagnosis Date  . Gout   . Hypertension   . Kidney stones    Past Surgical History:  Procedure Laterality Date  . TONSILLECTOMY AND ADENOIDECTOMY     Social History   Tobacco Use  . Smoking status: Never Smoker  . Smokeless tobacco: Never Used  Substance Use Topics  . Alcohol use: No   family history is not on file.  Medications: Current Outpatient Medications  Medication Sig Dispense Refill  . AMBULATORY NON FORMULARY MEDICATION Rolling Walker. Use as  needed for leg weakness and back pain.  Disp #1 Z74.09 1 each 0  . amitriptyline (ELAVIL) 25 MG tablet TAKE 1 TABLET (25 MG TOTAL) BY MOUTH AT BEDTIME AS NEEDED (PAIN). 90 tablet 1  . atorvastatin (LIPITOR) 40 MG tablet Take 1 tablet (40 mg total) by mouth daily. For cholesterol 90 tablet 3  . celecoxib (CELEBREX) 200 MG capsule One to 2 tablets by mouth daily as needed for pain. 180 capsule 2  . cetirizine (ZYRTEC) 10 MG tablet Take 1 tablet (10 mg total) by mouth daily. 30 tablet 11  . clindamycin (CLEOCIN) 300 MG capsule Take 1 capsule (300 mg total) by mouth 3 (three) times daily. 21 capsule 0  . diclofenac sodium (VOLTAREN) 1 % GEL APPLY 4 GRAMS TOPICALLY 4 TIMES DAILY TO AFFECTED JOINT 100 g 11  . furosemide (LASIX) 20 MG tablet Take 1 tablet (20 mg total) by mouth daily. For leg swelling 90 tablet 1  . hydrochlorothiazide (HYDRODIURIL) 25 MG tablet Take 1 tablet (25 mg total) by mouth daily. For blood pressure 90 tablet 2  . HYDROcodone-acetaminophen (NORCO/VICODIN) 5-325 MG tablet Take 1 tablet by mouth every 6 (six) hours as needed. 15 tablet 0  . lidocaine (LIDODERM) 5 % Place 1 patch onto the skin daily. Remove & Discard patch within 12 hours or as directed by MD 30 patch 0  . methocarbamol (ROBAXIN) 500 MG tablet Take 1 tablet (500 mg total) by mouth 3 (three) times daily. 90 tablet 0  . predniSONE (DELTASONE) 10 MG tablet Take 3 tablets (30 mg total) by mouth daily with breakfast. 15 tablet 0  . pregabalin (LYRICA) 150 MG capsule Take 1 capsule (150 mg total) by mouth 2 (two) times daily. 180 capsule 1  . primidone (MYSOLINE) 250 MG tablet Take 2 tablets (500 mg total) by mouth at bedtime. For tremor 180 tablet 1  . sildenafil (VIAGRA) 100 MG tablet Take 0.5-1 tablets (50-100 mg total) by mouth daily as needed for erectile dysfunction. 30 tablet 11  . tizanidine (ZANAFLEX) 2 MG capsule Take 1 capsule (2 mg total) by mouth 3 (three) times daily as needed for muscle spasms. 90 capsule 3   . triamcinolone cream (KENALOG) 0.1 % Apply 1 application topically 2 (two) times daily. 453.6 g 12  . venlafaxine XR (EFFEXOR-XR) 150 MG 24 hr capsule Take 1 capsule (150 mg total) by mouth daily with breakfast. 90 capsule 1   No current facility-administered medications for this visit.    Allergies  Allergen Reactions  . Flexeril [Cyclobenzaprine] Hives and Rash

## 2019-01-02 NOTE — Patient Instructions (Signed)
Thank you for coming in today. Continue medicine.  Increase primidone to 500mg  (2 pills ) at night.  As for the shoulder ok to follow up with me at [facility] sports medicine after Nov 20th in [place].

## 2019-01-03 DIAGNOSIS — M5442 Lumbago with sciatica, left side: Secondary | ICD-10-CM | POA: Diagnosis not present

## 2019-01-04 DIAGNOSIS — M5442 Lumbago with sciatica, left side: Secondary | ICD-10-CM | POA: Diagnosis not present

## 2019-01-05 DIAGNOSIS — M5442 Lumbago with sciatica, left side: Secondary | ICD-10-CM | POA: Diagnosis not present

## 2019-01-06 DIAGNOSIS — M5442 Lumbago with sciatica, left side: Secondary | ICD-10-CM | POA: Diagnosis not present

## 2019-01-07 DIAGNOSIS — M5442 Lumbago with sciatica, left side: Secondary | ICD-10-CM | POA: Diagnosis not present

## 2019-01-08 DIAGNOSIS — M5442 Lumbago with sciatica, left side: Secondary | ICD-10-CM | POA: Diagnosis not present

## 2019-01-09 DIAGNOSIS — M5442 Lumbago with sciatica, left side: Secondary | ICD-10-CM | POA: Diagnosis not present

## 2019-01-10 DIAGNOSIS — M5442 Lumbago with sciatica, left side: Secondary | ICD-10-CM | POA: Diagnosis not present

## 2019-01-11 DIAGNOSIS — M5442 Lumbago with sciatica, left side: Secondary | ICD-10-CM | POA: Diagnosis not present

## 2019-01-12 DIAGNOSIS — M5442 Lumbago with sciatica, left side: Secondary | ICD-10-CM | POA: Diagnosis not present

## 2019-01-13 DIAGNOSIS — M5442 Lumbago with sciatica, left side: Secondary | ICD-10-CM | POA: Diagnosis not present

## 2019-01-14 DIAGNOSIS — M5442 Lumbago with sciatica, left side: Secondary | ICD-10-CM | POA: Diagnosis not present

## 2019-01-15 DIAGNOSIS — M5442 Lumbago with sciatica, left side: Secondary | ICD-10-CM | POA: Diagnosis not present

## 2019-01-16 DIAGNOSIS — M5442 Lumbago with sciatica, left side: Secondary | ICD-10-CM | POA: Diagnosis not present

## 2019-01-17 DIAGNOSIS — M5442 Lumbago with sciatica, left side: Secondary | ICD-10-CM | POA: Diagnosis not present

## 2019-01-18 DIAGNOSIS — M5442 Lumbago with sciatica, left side: Secondary | ICD-10-CM | POA: Diagnosis not present

## 2019-01-19 DIAGNOSIS — M5442 Lumbago with sciatica, left side: Secondary | ICD-10-CM | POA: Diagnosis not present

## 2019-01-20 DIAGNOSIS — M5442 Lumbago with sciatica, left side: Secondary | ICD-10-CM | POA: Diagnosis not present

## 2019-01-21 DIAGNOSIS — M5442 Lumbago with sciatica, left side: Secondary | ICD-10-CM | POA: Diagnosis not present

## 2019-01-22 DIAGNOSIS — M5442 Lumbago with sciatica, left side: Secondary | ICD-10-CM | POA: Diagnosis not present

## 2019-01-23 DIAGNOSIS — M5442 Lumbago with sciatica, left side: Secondary | ICD-10-CM | POA: Diagnosis not present

## 2019-01-24 DIAGNOSIS — M5442 Lumbago with sciatica, left side: Secondary | ICD-10-CM | POA: Diagnosis not present

## 2019-01-25 DIAGNOSIS — M5442 Lumbago with sciatica, left side: Secondary | ICD-10-CM | POA: Diagnosis not present

## 2019-01-26 DIAGNOSIS — M5442 Lumbago with sciatica, left side: Secondary | ICD-10-CM | POA: Diagnosis not present

## 2019-01-27 DIAGNOSIS — M5442 Lumbago with sciatica, left side: Secondary | ICD-10-CM | POA: Diagnosis not present

## 2019-01-28 DIAGNOSIS — M5442 Lumbago with sciatica, left side: Secondary | ICD-10-CM | POA: Diagnosis not present

## 2019-01-29 DIAGNOSIS — M5442 Lumbago with sciatica, left side: Secondary | ICD-10-CM | POA: Diagnosis not present

## 2019-01-30 DIAGNOSIS — M5442 Lumbago with sciatica, left side: Secondary | ICD-10-CM | POA: Diagnosis not present

## 2019-01-31 DIAGNOSIS — M5442 Lumbago with sciatica, left side: Secondary | ICD-10-CM | POA: Diagnosis not present

## 2019-02-01 DIAGNOSIS — M5442 Lumbago with sciatica, left side: Secondary | ICD-10-CM | POA: Diagnosis not present

## 2019-02-02 DIAGNOSIS — M5442 Lumbago with sciatica, left side: Secondary | ICD-10-CM | POA: Diagnosis not present

## 2019-02-03 DIAGNOSIS — M5442 Lumbago with sciatica, left side: Secondary | ICD-10-CM | POA: Diagnosis not present

## 2019-02-04 DIAGNOSIS — M5442 Lumbago with sciatica, left side: Secondary | ICD-10-CM | POA: Diagnosis not present

## 2019-02-05 DIAGNOSIS — M5442 Lumbago with sciatica, left side: Secondary | ICD-10-CM | POA: Diagnosis not present

## 2019-02-06 DIAGNOSIS — M5442 Lumbago with sciatica, left side: Secondary | ICD-10-CM | POA: Diagnosis not present

## 2019-02-07 DIAGNOSIS — M5442 Lumbago with sciatica, left side: Secondary | ICD-10-CM | POA: Diagnosis not present

## 2019-02-08 DIAGNOSIS — M5442 Lumbago with sciatica, left side: Secondary | ICD-10-CM | POA: Diagnosis not present

## 2019-02-09 DIAGNOSIS — M5442 Lumbago with sciatica, left side: Secondary | ICD-10-CM | POA: Diagnosis not present

## 2019-02-10 DIAGNOSIS — M5442 Lumbago with sciatica, left side: Secondary | ICD-10-CM | POA: Diagnosis not present

## 2019-02-11 DIAGNOSIS — M5442 Lumbago with sciatica, left side: Secondary | ICD-10-CM | POA: Diagnosis not present

## 2019-02-12 DIAGNOSIS — M5442 Lumbago with sciatica, left side: Secondary | ICD-10-CM | POA: Diagnosis not present

## 2019-02-13 DIAGNOSIS — M5442 Lumbago with sciatica, left side: Secondary | ICD-10-CM | POA: Diagnosis not present

## 2019-02-14 DIAGNOSIS — M5442 Lumbago with sciatica, left side: Secondary | ICD-10-CM | POA: Diagnosis not present

## 2019-02-15 DIAGNOSIS — M5442 Lumbago with sciatica, left side: Secondary | ICD-10-CM | POA: Diagnosis not present

## 2019-02-16 DIAGNOSIS — M5442 Lumbago with sciatica, left side: Secondary | ICD-10-CM | POA: Diagnosis not present

## 2019-02-17 DIAGNOSIS — M5442 Lumbago with sciatica, left side: Secondary | ICD-10-CM | POA: Diagnosis not present

## 2019-02-18 DIAGNOSIS — M5442 Lumbago with sciatica, left side: Secondary | ICD-10-CM | POA: Diagnosis not present

## 2019-02-19 DIAGNOSIS — M5442 Lumbago with sciatica, left side: Secondary | ICD-10-CM | POA: Diagnosis not present

## 2019-02-20 DIAGNOSIS — M5442 Lumbago with sciatica, left side: Secondary | ICD-10-CM | POA: Diagnosis not present

## 2019-02-21 DIAGNOSIS — M5442 Lumbago with sciatica, left side: Secondary | ICD-10-CM | POA: Diagnosis not present

## 2019-02-22 DIAGNOSIS — M5442 Lumbago with sciatica, left side: Secondary | ICD-10-CM | POA: Diagnosis not present

## 2019-02-23 DIAGNOSIS — M5442 Lumbago with sciatica, left side: Secondary | ICD-10-CM | POA: Diagnosis not present

## 2019-02-24 DIAGNOSIS — M5442 Lumbago with sciatica, left side: Secondary | ICD-10-CM | POA: Diagnosis not present

## 2019-02-25 DIAGNOSIS — M5442 Lumbago with sciatica, left side: Secondary | ICD-10-CM | POA: Diagnosis not present

## 2019-02-26 DIAGNOSIS — M5442 Lumbago with sciatica, left side: Secondary | ICD-10-CM | POA: Diagnosis not present

## 2019-02-27 DIAGNOSIS — M5442 Lumbago with sciatica, left side: Secondary | ICD-10-CM | POA: Diagnosis not present

## 2019-02-28 ENCOUNTER — Ambulatory Visit: Payer: Self-pay

## 2019-02-28 ENCOUNTER — Encounter: Payer: Self-pay | Admitting: Family Medicine

## 2019-02-28 ENCOUNTER — Telehealth: Payer: Self-pay | Admitting: Internal Medicine

## 2019-02-28 ENCOUNTER — Other Ambulatory Visit: Payer: Self-pay

## 2019-02-28 ENCOUNTER — Ambulatory Visit (INDEPENDENT_AMBULATORY_CARE_PROVIDER_SITE_OTHER): Payer: Medicaid Other | Admitting: Family Medicine

## 2019-02-28 VITALS — BP 120/82 | HR 72 | Ht 70.0 in | Wt 293.2 lb

## 2019-02-28 DIAGNOSIS — G250 Essential tremor: Secondary | ICD-10-CM

## 2019-02-28 DIAGNOSIS — M4306 Spondylolysis, lumbar region: Secondary | ICD-10-CM | POA: Diagnosis not present

## 2019-02-28 DIAGNOSIS — M25511 Pain in right shoulder: Secondary | ICD-10-CM | POA: Diagnosis not present

## 2019-02-28 DIAGNOSIS — M5442 Lumbago with sciatica, left side: Secondary | ICD-10-CM | POA: Diagnosis not present

## 2019-02-28 MED ORDER — PREGABALIN 200 MG PO CAPS: 200.0000 mg | ORAL_CAPSULE | Freq: Two times a day (BID) | ORAL | 1 refills | Status: DC

## 2019-02-28 MED ORDER — NAPROXEN 500 MG PO TABS: 500.0000 mg | ORAL_TABLET | Freq: Two times a day (BID) | ORAL | 4 refills | Status: DC

## 2019-02-28 MED ORDER — PRIMIDONE 250 MG PO TABS: 250.0000 mg | ORAL_TABLET | Freq: Every day | ORAL | 1 refills | Status: DC

## 2019-02-28 MED ORDER — METHOCARBAMOL 500 MG PO TABS: 500.0000 mg | ORAL_TABLET | Freq: Three times a day (TID) | ORAL | 0 refills | Status: DC

## 2019-02-28 MED FILL — NAPROXEN 500 MG TABS: 500 | 30 days supply | Qty: 60 | Fill #0

## 2019-02-28 MED FILL — PRIMIDONE 250 MG TABLET: 250 | 90 days supply | Qty: 90 | Fill #0

## 2019-02-28 MED FILL — METHOCARBAMOL 500 MG TABLET: 500 | 30 days supply | Qty: 90 | Fill #0

## 2019-02-28 NOTE — Patient Instructions (Signed)
Thank you for coming in today. Recheck with me as needed.  Ask to establish care with primary upstairs when you check out.

## 2019-02-28 NOTE — Progress Notes (Signed)
I, William Vincent, LAT, ATC, am serving as scribe for Dr. [name].  William Vincent is a 53 y.o. male who presents to [facility] at [facility] today for f/u of R shoulder and chronic low back pain.  Pt was last seen by Dr. [name] on 01/02/19 and his Lyrica dosage was increased to help manage symptoms.  Since his last visit, pt reports no change in his symptoms.  He has increased his dose of the Lyrica w/ no change noted in his symptoms.  He con't to take the Tizanidine and states that he will need a refill of several meds today.  Pt notes that [facility] pharmacy did not have the primidone so he has not been taking that medication.  Additionally he has history of essential tremor.  This is managed with primidone as above.  As noted he needs refill.  I previously was his primary care provider and he is looking to reestablish with new PCP.  He like to establish PCP in this location.  ROS:  As above  Exam:  BP 120/82 (BP Location: Left Arm, Patient Position: Sitting, Cuff Size: Large)   Pulse 72   Ht 5\' 10"  (1.778 m)   Wt 293 lb 3.2 oz (133 kg)   SpO2 97%   BMI 42.07 kg/m  Wt Readings from Last 5 Encounters:  02/28/19 293 lb 3.2 oz (133 kg)  11/15/18 290 lb (131.5 kg)  10/18/18 279 lb (126.6 kg)  09/20/18 279 lb (126.6 kg)  07/18/18 279 lb (126.6 kg)   General: Well Developed, well nourished, and in no acute distress.  Neuro/Psych: Alert and oriented x3, extra-ocular muscles intact, able to move all 4 extremities, sensation grossly intact. Skin: Warm and dry, no rashes noted.  Respiratory: Not using accessory muscles, speaking in full sentences, trachea midline.  Cardiovascular: Pulses palpable, no extremity edema. Abdomen: Does not appear distended. MSK:  Right shoulder: Normal-appearing Range of motion abduction limited to 100 degrees active 140 degrees passive.  Limited by pain. Normal external and internal rotation. Strength diminished abduction 4/5.  Normal  external and internal rotation. Positive Hawkins and Neer's test positive empty can test.   Negative Yergason's and speeds test.    Lab and Radiology Results  Procedure: Real-time Ultrasound Guided Injection of right subacromial bursa Device: Philips Affiniti 50G Images permanently stored and available for review in the ultrasound unit. Verbal informed consent obtained.  Discussed risks and benefits of procedure. Warned about infection bleeding damage to structures skin hypopigmentation and fat atrophy among others. Patient expresses understanding and agreement Time-out conducted.   Noted no overlying erythema, induration, or other signs of local infection.   Skin prepped in a sterile fashion.   Local anesthesia: Topical Ethyl chloride.   With sterile technique and under real time ultrasound guidance:  40 mg of Kenalog and 2 mL of Marcaine injected easily.   Completed without difficulty   Pain immediately resolved suggesting accurate placement of the medication.   Advised to call if fevers/chills, erythema, induration, drainage, or persistent bleeding.   Images permanently stored and available for review in the ultrasound unit.  Impression: Technically successful ultrasound guided injection.         Assessment and Plan: 53 y.o. male with  Right shoulder pain: Due to rotator cuff tendinopathy/subacromial impingement.  Plan to treat with injection as above as well as home exercise program taught by ATC.  Recheck back with me as needed for this issue.  Essential tremor: Moderately controlled.  Continue primidone.  Back pain: Continue chronic medications for pain as needed.  Happy to continue managing this problem in the future.  Recommend patient establish care with one of my colleagues at this location for primary care needs going forwards.  Happy to help in any way.    Orders Placed This Encounter  Procedures  . No Chg US upper Rt    Order Specific Question:   Reason for  Exam (SYMPTOM  OR DIAGNOSIS REQUIRED)    Answer:   rt shoulder pain    Order Specific Question:   Preferred imaging location?    Answer:   [facility]    Order Specific Question:   Release to patient    Answer:   Immediate   Meds ordered this encounter  Medications  . primidone (MYSOLINE) 250 MG tablet    Sig: Take 1 tablet (250 mg total) by mouth at bedtime. For tremor    Dispense:  90 tablet    Refill:  1  . naproxen (NAPROSYN) 500 MG tablet    Sig: Take 1 tablet (500 mg total) by mouth 2 (two) times daily with a meal.    Dispense:  60 tablet    Refill:  4  . methocarbamol (ROBAXIN) 500 MG tablet    Sig: Take 1 tablet (500 mg total) by mouth 3 (three) times daily.    Dispense:  90 tablet    Refill:  0  . pregabalin (LYRICA) 200 MG capsule    Sig: Take 1 capsule (200 mg total) by mouth 2 (two) times daily.    Dispense:  180 capsule    Refill:  1    Historical information moved to improve visibility of documentation.  Past Medical History:  Diagnosis Date  . Gout   . Hypertension   . Kidney stones    Past Surgical History:  Procedure Laterality Date  . TONSILLECTOMY AND ADENOIDECTOMY     Social History   Tobacco Use  . Smoking status: Never Smoker  . Smokeless tobacco: Never Used  Substance Use Topics  . Alcohol use: No   family history is not on file.  Medications: Current Outpatient Medications  Medication Sig Dispense Refill  . AMBULATORY NON FORMULARY MEDICATION Rolling Walker. Use as needed for leg weakness and back pain.  Disp #1 Z74.09 1 each 0  . amitriptyline (ELAVIL) 25 MG tablet TAKE 1 TABLET (25 MG TOTAL) BY MOUTH AT BEDTIME AS NEEDED (PAIN). 90 tablet 1  . atorvastatin (LIPITOR) 40 MG tablet Take 1 tablet (40 mg total) by mouth daily. For cholesterol 90 tablet 3  . cetirizine (ZYRTEC) 10 MG tablet Take 1 tablet (10 mg total) by mouth daily. 30 tablet 11  . diclofenac sodium (VOLTAREN) 1 % GEL APPLY 4 GRAMS TOPICALLY 4 TIMES DAILY TO  AFFECTED JOINT 100 g 11  . furosemide (LASIX) 20 MG tablet Take 1 tablet (20 mg total) by mouth daily. For leg swelling 90 tablet 1  . hydrochlorothiazide (HYDRODIURIL) 25 MG tablet Take 1 tablet (25 mg total) by mouth daily. For blood pressure 90 tablet 2  . HYDROcodone-acetaminophen (NORCO/VICODIN) 5-325 MG tablet Take 1 tablet by mouth every 6 (six) hours as needed. 15 tablet 0  . lidocaine (LIDODERM) 5 % Place 1 patch onto the skin daily. Remove & Discard patch within 12 hours or as directed by MD 30 patch 0  . methocarbamol (ROBAXIN) 500 MG tablet Take 1 tablet (500 mg total) by mouth 3 (three) times daily. 90  tablet 0  . naproxen (NAPROSYN) 500 MG tablet Take 1 tablet (500 mg total) by mouth 2 (two) times daily with a meal. 60 tablet 4  . pregabalin (LYRICA) 200 MG capsule Take 1 capsule (200 mg total) by mouth 2 (two) times daily. 180 capsule 1  . sildenafil (VIAGRA) 100 MG tablet Take 0.5-1 tablets (50-100 mg total) by mouth daily as needed for erectile dysfunction. 30 tablet 11  . tizanidine (ZANAFLEX) 2 MG capsule Take 1 capsule (2 mg total) by mouth 3 (three) times daily as needed for muscle spasms. 90 capsule 3  . triamcinolone cream (KENALOG) 0.1 % Apply 1 application topically 2 (two) times daily. 453.6 g 12  . venlafaxine XR (EFFEXOR-XR) 150 MG 24 hr capsule Take 1 capsule (150 mg total) by mouth daily with breakfast. 90 capsule 1  . primidone (MYSOLINE) 250 MG tablet Take 1 tablet (250 mg total) by mouth at bedtime. For tremor 90 tablet 1   No current facility-administered medications for this visit.   Allergies  Allergen Reactions  . Flexeril [Cyclobenzaprine] Hives and Rash      Discussed warning signs or symptoms. Please see discharge instructions. Patient expresses understanding.  The above documentation has been reviewed and is accurate and complete Lynne Leader

## 2019-02-28 NOTE — Telephone Encounter (Signed)
Elma with me but only if pt understands I do not treat chronic pain with hydrocodone or higher narcotic medicaitons, thanks

## 2019-02-28 NOTE — Telephone Encounter (Signed)
This patient was here to see Dr Georgina Snell in [place]. He was a primary patient of his while he was at the [facility] but it no longer able to see him as his PCP. Would you be willing to see this patient to establish care?  He said that he would prefer to have someone here is this office if possible.  Please advise.  - Patient can be reached at [phone]

## 2019-03-01 DIAGNOSIS — M5442 Lumbago with sciatica, left side: Secondary | ICD-10-CM | POA: Diagnosis not present

## 2019-03-01 NOTE — Telephone Encounter (Signed)
Appointment scheduled.

## 2019-03-02 DIAGNOSIS — M5442 Lumbago with sciatica, left side: Secondary | ICD-10-CM | POA: Diagnosis not present

## 2019-03-04 DIAGNOSIS — M5442 Lumbago with sciatica, left side: Secondary | ICD-10-CM | POA: Diagnosis not present

## 2019-03-05 DIAGNOSIS — M5442 Lumbago with sciatica, left side: Secondary | ICD-10-CM | POA: Diagnosis not present

## 2019-03-15 ENCOUNTER — Other Ambulatory Visit: Payer: Self-pay

## 2019-03-15 ENCOUNTER — Ambulatory Visit: Payer: Medicaid Other | Admitting: Internal Medicine

## 2019-03-16 ENCOUNTER — Telehealth: Payer: Self-pay | Admitting: Family Medicine

## 2019-03-16 ENCOUNTER — Encounter: Payer: Self-pay | Admitting: Family Medicine

## 2019-03-16 NOTE — Telephone Encounter (Signed)
Called pt and relayed Dr. Zollie Pee message to him.  Informed pt that we have placed his forms in the mail so he should receive them soon.  Pt states that he was hoping that Dr [name] could fill in his medications for him and I explain that I'm happy to provide that information once he receives the forms.

## 2019-03-16 NOTE — Telephone Encounter (Signed)
I saw William Vincent yesterday. She dropped off some forms for Social Security disability application. However I reviewed these forms and it looks like you're supposed to fill them out and not me. I have mailed the forms back to you so that you can complete them and send them in as directed on the form.

## 2019-04-03 ENCOUNTER — Ambulatory Visit: Payer: Self-pay

## 2019-04-03 ENCOUNTER — Ambulatory Visit (INDEPENDENT_AMBULATORY_CARE_PROVIDER_SITE_OTHER): Payer: Medicaid Other

## 2019-04-03 ENCOUNTER — Ambulatory Visit (INDEPENDENT_AMBULATORY_CARE_PROVIDER_SITE_OTHER): Payer: Medicaid Other | Admitting: Family Medicine

## 2019-04-03 ENCOUNTER — Other Ambulatory Visit: Payer: Self-pay

## 2019-04-03 ENCOUNTER — Encounter: Payer: Self-pay | Admitting: Family Medicine

## 2019-04-03 VITALS — BP 130/94 | HR 72 | Ht 70.0 in | Wt 290.0 lb

## 2019-04-03 DIAGNOSIS — M25561 Pain in right knee: Secondary | ICD-10-CM

## 2019-04-03 DIAGNOSIS — S8991XA Unspecified injury of right lower leg, initial encounter: Secondary | ICD-10-CM | POA: Diagnosis not present

## 2019-04-03 NOTE — Progress Notes (Signed)
I, [name], LAT, ATC, am serving as scribe for Dr. [name].  William Vincent is a 54 y.o. male who presents to [facility] at [facility] today for R knee pain and weakness x one week.  He states that his R knee gave out when he was standing up from the toilet that caused him to fall face first.  Pt rates his R knee pain at a 7/10 that he describes as aching and throbbing.  Aggravating factors include weight bearing.  He reports R knee swelling and mechanical knee symptoms.     Pertinent review of systems: No fevers or chills  Relevant historical information: Chronic back and leg pain secondary to spondylolisthesis and DJD.   Exam:  BP (!) 130/94 (BP Location: Left Arm, Patient Position: Sitting, Cuff Size: Large)   Pulse 72   Ht 5\' 10"  (1.778 m)   Wt 290 lb (131.5 kg)   SpO2 98%   BMI 41.61 kg/m  General: Well Developed, well nourished, and in no acute distress.   MSK:  Right knee: No significant effusion mildly swollen.  No erythema. Range of motion 0-120 degrees with mild crepitation. Mild tender palpation medial and lateral joint line. Stable ligaments exam. Intact flexion and extension strength. Antalgic gait.   Shoulders bilateral normal motion normal strength negative impingement testing.   Lab and Radiology Results \ X-ray images right knee obtained today personally independently reviewed Mild DJD.  No acute fractures. Await formal radiology review  Procedure: Real-time Ultrasound Guided Injection of right knee Device: Philips Affiniti 50G Images permanently stored and available for review in the ultrasound unit. Verbal informed consent obtained.  Discussed risks and benefits of procedure. Warned about infection bleeding damage to structures skin hypopigmentation and fat atrophy among others. Patient expresses understanding and agreement Time-out conducted.   Noted no overlying erythema, induration, or other signs of local infection.    Skin prepped in a sterile fashion.   Local anesthesia: Topical Ethyl chloride.   With sterile technique and under real time ultrasound guidance:  Milligrams of Depo-Medrol 4 mL of Marcaine injected easily.   Completed without difficulty   Pain immediately resolved suggesting accurate placement of the medication.   Advised to call if fevers/chills, erythema, induration, drainage, or persistent bleeding.   Images permanently stored and available for review in the ultrasound unit.  Impression: Technically successful ultrasound guided injection.  Limited musculoskeletal ultrasound examination right knee. Intact quad tendon with no significant effusion. Intact patellar tendon Medial joint line intact appearing meniscus with narrowing of medial joint line. Lateral joint line: Intact.  Meniscus narrowing lateral joint line. No obvious fractures. Impression: DJD knee examination.       Assessment and Plan: 54 y.o. male with right knee pain after fall and injury.  Exacerbation of chronic knee pain.  No evidence of severe ligament injury on today's examination.  X-ray done after the visit showed no fractures however formal radiology review is still pending. Plan to proceed with injection as above continued medications for pain and recheck if not improving.    Orders Placed This Encounter  Procedures  . 44 LIMITED JOINT SPACE STRUCTURES LOW RIGHT(NO LINKED CHARGES)    Order Specific Question:   Reason for Exam (SYMPTOM  OR DIAGNOSIS REQUIRED)    Answer:   R knee pain    Order Specific Question:   Preferred imaging location?    Answer:   [facility]  . DG Knee 4 Views W/Patella Right  Standing Status:   Future    Standing Expiration Date:   05/31/2020    Order Specific Question:   Reason for Exam (SYMPTOM  OR DIAGNOSIS REQUIRED)    Answer:   fall knee pain s/p injection today    Order Specific Question:   Preferred imaging location?    Answer:   [facility]    Order Specific Question:   Radiology Contrast Protocol - do NOT remove file path    Answer:   \\[email or web address]   No orders of the defined types were placed in this encounter.    Discussed warning signs or symptoms. Please see discharge instructions. Patient expresses understanding.   The above documentation has been reviewed and is accurate and complete Lynne Leader

## 2019-04-03 NOTE — Patient Instructions (Addendum)
You had a R knee injection today. Call or go to the ER if you develop a large red swollen joint with extreme pain or oozing puss.  Get xray today.  Call or go to the ER if you develop a large red swollen joint with extreme pain or oozing puss.  Continue medicines as needed for pain.  Recheck with me as needed.

## 2019-04-04 ENCOUNTER — Telehealth: Payer: Self-pay | Admitting: *Deleted

## 2019-04-04 NOTE — Progress Notes (Signed)
Knee is largely normal-appearing with no fractures.

## 2019-04-04 NOTE — Telephone Encounter (Signed)
Pt's wife called requesting a call back. She stated she has some questions about the pt's appt yesterday with Dr. [name].

## 2019-04-06 NOTE — Telephone Encounter (Signed)
I attempted to call William Vincent's wife [name].  She did not answer and I am unable to leave a message. If we do call back please try to get a bit more information of what she needs answered.

## 2019-04-12 ENCOUNTER — Encounter: Payer: Self-pay | Admitting: Family Medicine

## 2019-04-12 ENCOUNTER — Ambulatory Visit (INDEPENDENT_AMBULATORY_CARE_PROVIDER_SITE_OTHER): Payer: Medicaid Other | Admitting: Family Medicine

## 2019-04-12 ENCOUNTER — Other Ambulatory Visit: Payer: Self-pay

## 2019-04-12 DIAGNOSIS — I10 Essential (primary) hypertension: Secondary | ICD-10-CM

## 2019-04-12 DIAGNOSIS — F321 Major depressive disorder, single episode, moderate: Secondary | ICD-10-CM

## 2019-04-12 DIAGNOSIS — G8929 Other chronic pain: Secondary | ICD-10-CM

## 2019-04-12 DIAGNOSIS — M5441 Lumbago with sciatica, right side: Secondary | ICD-10-CM

## 2019-04-12 DIAGNOSIS — M5442 Lumbago with sciatica, left side: Secondary | ICD-10-CM

## 2019-04-12 DIAGNOSIS — E785 Hyperlipidemia, unspecified: Secondary | ICD-10-CM | POA: Insufficient documentation

## 2019-04-12 DIAGNOSIS — G250 Essential tremor: Secondary | ICD-10-CM

## 2019-04-12 DIAGNOSIS — E7800 Pure hypercholesterolemia, unspecified: Secondary | ICD-10-CM | POA: Diagnosis not present

## 2019-04-12 MED ORDER — AMLODIPINE BESYLATE 5 MG PO TABS: 5.0000 mg | ORAL_TABLET | Freq: Every day | ORAL | 1 refills | Status: DC

## 2019-04-12 MED FILL — AMLODIPINE BESYLATE 5 MG TA: 5 | 90 days supply | Qty: 90 | Fill #0

## 2019-04-12 NOTE — Progress Notes (Signed)
William Vincent - 54 y.o. male MRN [id number]  Date of birth: [birth year]  Subjective Chief Complaint  Patient presents with  . Follow-up    HPI William Vincent is a 54 y.o. male with history of HTN, HLD, chronic back and knee pain, and essential tremor here today for routine follow up.    -HTN:  Reports taking hctz and lisinopril for management of HTN previously however states that he was having increased sweats.  This occurred just after taking these medications and have stopped since discontinuing these.  BP is elevated today. He denies anginal symptoms, shortness of breath, palpitations, or headache.   -Chronic back/knee pain:  He continues to follow with Dr. Georgina Snell for management of chronic back and knee pain.  He is on several medication for management of this including elavil, effexor, zanaflex, lidocaine patch and naproxen.  Pain is somewhat well controlled with current regimen.  He denies significant side effect from current medications.    -HLD:  Current tx with lipitor.  He is tolerating well without myalgias.   Lab Results  Component Value Date   LDLCALC 69 07/14/2017     -Tremor:  Tremor is well managed with primidone.  He denies side effects from medication including sedation.    ROS:  A comprehensive ROS was completed and negative except as noted per HPI  Allergies  Allergen Reactions  . Flexeril [Cyclobenzaprine] Hives and Rash    Past Medical History:  Diagnosis Date  . Gout   . Hypertension   . Kidney stones     Past Surgical History:  Procedure Laterality Date  . TONSILLECTOMY AND ADENOIDECTOMY      Social History   Socioeconomic History  . Marital status: Single    Spouse name: Not on file  . Number of children: Not on file  . Years of education: Not on file  . Highest education level: Not on file  Occupational History  . Not on file  Tobacco Use  . Smoking status: Never Smoker  . Smokeless tobacco: Never Used  Substance and Sexual Activity  .  Alcohol use: No  . Drug use: No  . Sexual activity: Not on file  Other Topics Concern  . Not on file  Social History Narrative  . Not on file   Social Determinants of Health   Financial Resource Strain:   . Difficulty of Paying Living Expenses: Not on file  Food Insecurity:   . Worried About Charity fundraiser in the Last Year: Not on file  . Ran Out of Food in the Last Year: Not on file  Transportation Needs:   . Lack of Transportation (Medical): Not on file  . Lack of Transportation (Non-Medical): Not on file  Physical Activity:   . Days of Exercise per Week: Not on file  . Minutes of Exercise per Session: Not on file  Stress:   . Feeling of Stress : Not on file  Social Connections:   . Frequency of Communication with Friends and Family: Not on file  . Frequency of Social Gatherings with Friends and Family: Not on file  . Attends Religious Services: Not on file  . Active Member of Clubs or Organizations: Not on file  . Attends Archivist Meetings: Not on file  . Marital Status: Not on file    No family history on file.  Health Maintenance  Topic Date Due  . COLONOSCOPY  08/24/2017  . TETANUS/TDAP  02/19/2027  . INFLUENZA VACCINE  Completed  .  HIV Screening  Completed    ----------------------------------------------------------------------------------------------------------------------------------------------------------------------------------------------------------------- Physical Exam BP (!) 156/110   Pulse (!) 106   Ht 5\' 10"  (1.778 m)   Wt 290 lb (131.5 kg)   BMI 41.61 kg/m   Physical Exam HENT:     Head: Normocephalic and atraumatic.  Eyes:     General: No scleral icterus. Cardiovascular:     Rate and Rhythm: Normal rate and regular rhythm.  Pulmonary:     Effort: Pulmonary effort is normal.     Breath sounds: Normal breath sounds.  Musculoskeletal:     Cervical back: Neck supple.  Skin:    General: Skin is warm.  Neurological:      Mental Status: He is alert.     Comments: Walks with antalgic gait, using cane  Psychiatric:        Mood and Affect: Mood normal.        Behavior: Behavior normal.     ------------------------------------------------------------------------------------------------------------------------------------------------------------------------------------------------------------------- Assessment and Plan  HTN (hypertension) BP is not well controlled today.  He was having some sweats that have stopped with d/c of hctz and lisinopril.   He will hold these for now.  Start amlodipine 5mg  and follow up in 6 weeks.   Chronic back pain Current medication managed by Dr. , stable at this time.   Essential tremor Stable with primidone, continue at current dose.   Major depression Stable with current dose of effexor.   HLD (hyperlipidemia) Due for updated labs.  Will obtain at next follow up for BP check.   Continue current lipitor dose.      This visit occurred during the SARS-CoV-2 public health emergency.  Safety protocols were in place, including screening questions prior to the visit, additional usage of staff PPE, and extensive cleaning of exam room while observing appropriate contact time as indicated for disinfecting solutions.

## 2019-04-12 NOTE — Patient Instructions (Signed)
Nice to meet you today! Start amlodipine to replace current blood pressure medication.   See me again in 6 weeks.

## 2019-04-12 NOTE — Assessment & Plan Note (Signed)
Stable with current dose of effexor.

## 2019-04-12 NOTE — Assessment & Plan Note (Signed)
Stable with primidone, continue at current dose.

## 2019-04-12 NOTE — Assessment & Plan Note (Addendum)
BP is not well controlled today.  He was having some sweats that have stopped with d/c of hctz and lisinopril.   He will hold these for now.  Start amlodipine 5mg  and follow up in 6 weeks.

## 2019-04-12 NOTE — Assessment & Plan Note (Addendum)
Due for updated labs.  Will obtain at next follow up for BP check.   Continue current lipitor dose.

## 2019-04-12 NOTE — Assessment & Plan Note (Signed)
Current medication managed by Dr. [name], stable at this time.

## 2019-04-13 DIAGNOSIS — M5442 Lumbago with sciatica, left side: Secondary | ICD-10-CM | POA: Diagnosis not present

## 2019-04-14 DIAGNOSIS — M5442 Lumbago with sciatica, left side: Secondary | ICD-10-CM | POA: Diagnosis not present

## 2019-04-15 DIAGNOSIS — M5442 Lumbago with sciatica, left side: Secondary | ICD-10-CM | POA: Diagnosis not present

## 2019-04-16 DIAGNOSIS — M5442 Lumbago with sciatica, left side: Secondary | ICD-10-CM | POA: Diagnosis not present

## 2019-04-19 MED FILL — AMLODIPINE BESYLATE 5 MG TA: 5 | 90 days supply | Qty: 90 | Fill #0 | Status: TO

## 2019-04-21 ENCOUNTER — Other Ambulatory Visit: Payer: Self-pay | Admitting: Family Medicine

## 2019-04-21 MED FILL — CELECOXIB 200 MG CAP: 200 | 90 days supply | Qty: 180 | Fill #0

## 2019-04-21 MED FILL — AMLODIPINE BESYLATE 5 MG TA: 5 | 90 days supply | Qty: 90 | Fill #0

## 2019-04-21 MED FILL — VENLAFAXINE HCL ER 150 MG C: 150 | 90 days supply | Qty: 90 | Fill #0

## 2019-04-28 DIAGNOSIS — M5442 Lumbago with sciatica, left side: Secondary | ICD-10-CM | POA: Diagnosis not present

## 2019-04-29 DIAGNOSIS — M5442 Lumbago with sciatica, left side: Secondary | ICD-10-CM | POA: Diagnosis not present

## 2019-04-30 DIAGNOSIS — M5442 Lumbago with sciatica, left side: Secondary | ICD-10-CM | POA: Diagnosis not present

## 2019-05-01 DIAGNOSIS — M5442 Lumbago with sciatica, left side: Secondary | ICD-10-CM | POA: Diagnosis not present

## 2019-05-02 DIAGNOSIS — M5442 Lumbago with sciatica, left side: Secondary | ICD-10-CM | POA: Diagnosis not present

## 2019-05-03 DIAGNOSIS — M5442 Lumbago with sciatica, left side: Secondary | ICD-10-CM | POA: Diagnosis not present

## 2019-05-04 DIAGNOSIS — M5442 Lumbago with sciatica, left side: Secondary | ICD-10-CM | POA: Diagnosis not present

## 2019-05-05 ENCOUNTER — Ambulatory Visit (INDEPENDENT_AMBULATORY_CARE_PROVIDER_SITE_OTHER): Payer: Medicaid Other | Admitting: Family Medicine

## 2019-05-05 ENCOUNTER — Ambulatory Visit (INDEPENDENT_AMBULATORY_CARE_PROVIDER_SITE_OTHER): Payer: Medicaid Other

## 2019-05-05 ENCOUNTER — Other Ambulatory Visit: Payer: Self-pay

## 2019-05-05 ENCOUNTER — Ambulatory Visit: Payer: Self-pay

## 2019-05-05 ENCOUNTER — Encounter: Payer: Self-pay | Admitting: Family Medicine

## 2019-05-05 VITALS — BP 130/90 | HR 104 | Ht 70.0 in | Wt 284.0 lb

## 2019-05-05 DIAGNOSIS — M25562 Pain in left knee: Secondary | ICD-10-CM | POA: Diagnosis not present

## 2019-05-05 DIAGNOSIS — H5052 Exophoria: Secondary | ICD-10-CM | POA: Diagnosis not present

## 2019-05-05 DIAGNOSIS — L739 Follicular disorder, unspecified: Secondary | ICD-10-CM

## 2019-05-05 DIAGNOSIS — H52223 Regular astigmatism, bilateral: Secondary | ICD-10-CM | POA: Diagnosis not present

## 2019-05-05 DIAGNOSIS — H524 Presbyopia: Secondary | ICD-10-CM | POA: Diagnosis not present

## 2019-05-05 MED ORDER — DOXYCYCLINE HYCLATE 100 MG PO TABS: 100.0000 mg | ORAL_TABLET | Freq: Two times a day (BID) | ORAL | 0 refills | Status: AC

## 2019-05-05 MED FILL — DOXYCYCLINE HYCLATE 100 MG: 100 | 7 days supply | Qty: 14 | Fill #0

## 2019-05-05 NOTE — Progress Notes (Signed)
I, [name], LAT, ATC, am serving as scribe for Dr. Lynne Leader.  William Vincent is a 54 y.o. male who presents to [place] [facility] today for L knee pain and weakness.  He was last seen by Dr. Georgina Snell on 04/03/19 for his R knee pain and had an injection in his R knee.  Since his last visit, pt reports the L knee is now hurting X1 week  and will go numb. 7/10 constant pain achy and shooting pain. Is taking Medication as prescribed and it helps.   Additionally patient notes a itchy to painful rash on his buttocks above the gluteal cleft.  He notes this is been present for a few days.  He is tried some over-the-counter cream which has not helped.  No fevers or chills.  Diagnostic imaging: B knee XR- 10/20/17; R knee XR- 04/03/19  Pertinent review of systems: No fevers or chills  Relevant historical information: Hypertension and spondylolisthesis   Exam:  BP 130/90 (BP Location: Left Arm, Patient Position: Sitting, Cuff Size: Large)   Pulse (!) 104   Ht 5\' 10"  (1.778 m)   Wt 284 lb (128.8 kg)   SpO2 97%   BMI 40.75 kg/m  General: Well Developed, well nourished, and in no acute distress.   MSK: Left knee: No significant effusion.  Diffusely tender.  Intact strength.  Stable ligamentous exam. Skin: Erythematous maculopapular rash above gluteal cleft.  No rash extending longitudinally around torso.  Nontender.   Lab and Radiology Results  Procedure: Real-time Ultrasound Guided Injection of left knee Device: Philips Affiniti 50G Images permanently stored and available for review in the ultrasound unit. Verbal informed consent obtained.  Discussed risks and benefits of procedure. Warned about infection bleeding damage to structures skin hypopigmentation and fat atrophy among others. Patient expresses understanding and agreement Time-out conducted.   Noted no overlying erythema, induration, or other signs of local infection.   Skin prepped in a sterile fashion.    Local anesthesia: Topical Ethyl chloride.   With sterile technique and under real time ultrasound guidance:  40 mg of Kenalog and 4 mL of Marcaine injected easily.   Completed without difficulty   Pain immediately resolved suggesting accurate placement of the medication.   Advised to call if fevers/chills, erythema, induration, drainage, or persistent bleeding.   Images permanently stored and available for review in the ultrasound unit.  Impression: Technically successful ultrasound guided injection.   X-ray images left knee obtained today personally independently reviewed. No acute fractures.  Mild to moderate degenerative changes. Await formal radiology review      Assessment and Plan: 54 y.o. male with left knee pain.  DJD primary cause.  Plan for steroid injection as above.  Recommend weight loss and quad strengthening.  Recheck back as needed.  Rashes likely to be folliculitis.  Doubtful for shingles given no extension laterally.  Plan to treat with doxycycline.  Recheck back if not improving.  Recommend follow-up with PCP for this issue if needed.  PDMP not reviewed this encounter. Orders Placed This Encounter  Procedures  . [place] LIMITED JOINT SPACE STRUCTURES LOW LEFT(NO LINKED CHARGES)    Order Specific Question:   Reason for Exam (SYMPTOM  OR DIAGNOSIS REQUIRED)    Answer:   inj left knee    Order Specific Question:   Preferred imaging location?    Answer:   [facility]  . DG Knee 4 Views W/Patella Left    Standing Status:  Future    Number of Occurrences:   1    Standing Expiration Date:   07/02/2020    Order Specific Question:   Reason for Exam (SYMPTOM  OR DIAGNOSIS REQUIRED)    Answer:   eval left knee pain    Order Specific Question:   Preferred imaging location?    Answer:   [facility]    Order Specific Question:   Radiology Contrast Protocol - do NOT remove file path    Answer:    [email or web address]   Meds ordered this encounter  Medications  . doxycycline (VIBRA-TABS) 100 MG tablet    Sig: Take 1 tablet (100 mg total) by mouth 2 (two) times daily for 7 days.    Dispense:  14 tablet    Refill:  0     Discussed warning signs or symptoms. Please see discharge instructions. Patient expresses understanding.   The above documentation has been reviewed and is accurate and complete [name]

## 2019-05-05 NOTE — Patient Instructions (Addendum)
Thank you for coming in today. Call or go to the ER if you develop a large red swollen joint with extreme pain or oozing puss.  Recheck in about 1 month.  Take doxycycline twice daily for 1 week for the rash on the buttocks. Also use some of the triamcinolone cream on this area as well. In 1 month we will reassess the knee and also the shoulder.

## 2019-05-06 DIAGNOSIS — M5442 Lumbago with sciatica, left side: Secondary | ICD-10-CM | POA: Diagnosis not present

## 2019-05-07 DIAGNOSIS — M5442 Lumbago with sciatica, left side: Secondary | ICD-10-CM | POA: Diagnosis not present

## 2019-05-08 NOTE — Progress Notes (Signed)
X-ray knee looks pretty normal.  No severe arthritis.  No fractures.

## 2019-05-10 DIAGNOSIS — M5442 Lumbago with sciatica, left side: Secondary | ICD-10-CM | POA: Diagnosis not present

## 2019-05-11 DIAGNOSIS — M5442 Lumbago with sciatica, left side: Secondary | ICD-10-CM | POA: Diagnosis not present

## 2019-05-12 DIAGNOSIS — M5442 Lumbago with sciatica, left side: Secondary | ICD-10-CM | POA: Diagnosis not present

## 2019-05-13 DIAGNOSIS — M5442 Lumbago with sciatica, left side: Secondary | ICD-10-CM | POA: Diagnosis not present

## 2019-05-14 DIAGNOSIS — M5442 Lumbago with sciatica, left side: Secondary | ICD-10-CM | POA: Diagnosis not present

## 2019-05-15 DIAGNOSIS — M5442 Lumbago with sciatica, left side: Secondary | ICD-10-CM | POA: Diagnosis not present

## 2019-05-16 DIAGNOSIS — M5442 Lumbago with sciatica, left side: Secondary | ICD-10-CM | POA: Diagnosis not present

## 2019-05-17 DIAGNOSIS — M5442 Lumbago with sciatica, left side: Secondary | ICD-10-CM | POA: Diagnosis not present

## 2019-05-19 DIAGNOSIS — M5442 Lumbago with sciatica, left side: Secondary | ICD-10-CM | POA: Diagnosis not present

## 2019-05-20 DIAGNOSIS — M5442 Lumbago with sciatica, left side: Secondary | ICD-10-CM | POA: Diagnosis not present

## 2019-05-21 DIAGNOSIS — M5442 Lumbago with sciatica, left side: Secondary | ICD-10-CM | POA: Diagnosis not present

## 2019-05-22 DIAGNOSIS — M5442 Lumbago with sciatica, left side: Secondary | ICD-10-CM | POA: Diagnosis not present

## 2019-05-23 DIAGNOSIS — M5442 Lumbago with sciatica, left side: Secondary | ICD-10-CM | POA: Diagnosis not present

## 2019-05-24 ENCOUNTER — Other Ambulatory Visit: Payer: Self-pay

## 2019-05-24 ENCOUNTER — Encounter: Payer: Self-pay | Admitting: Family Medicine

## 2019-05-24 ENCOUNTER — Ambulatory Visit (INDEPENDENT_AMBULATORY_CARE_PROVIDER_SITE_OTHER): Payer: Medicaid Other | Admitting: Family Medicine

## 2019-05-24 DIAGNOSIS — I10 Essential (primary) hypertension: Secondary | ICD-10-CM

## 2019-05-24 DIAGNOSIS — H9192 Unspecified hearing loss, left ear: Secondary | ICD-10-CM

## 2019-05-24 DIAGNOSIS — H9190 Unspecified hearing loss, unspecified ear: Secondary | ICD-10-CM | POA: Insufficient documentation

## 2019-05-24 MED ORDER — AMLODIPINE BESYLATE 10 MG PO TABS: 10.0000 mg | ORAL_TABLET | Freq: Every day | ORAL | 1 refills | Status: AC

## 2019-05-24 MED FILL — AMLODIPINE BESYLATE 10 MG T: 10 | 90 days supply | Qty: 90 | Fill #0

## 2019-05-24 NOTE — Patient Instructions (Signed)
Increase amlodipine to 10mg  daily.  Return in 2 weeks for nurse visit for BP check.    Hypertension, Adult Hypertension is another name for high blood pressure. High blood pressure forces your heart to work harder to pump blood. This can cause problems over time. There are two numbers in a blood pressure reading. There is a top number (systolic) over a bottom number (diastolic). It is best to have a blood pressure that is below 120/80. Healthy choices can help lower your blood pressure, or you may need medicine to help lower it. What are the causes? The cause of this condition is not known. Some conditions may be related to high blood pressure. What increases the risk?  Smoking.  Having type 2 diabetes mellitus, high cholesterol, or both.  Not getting enough exercise or physical activity.  Being overweight.  Having too much fat, sugar, calories, or salt (sodium) in your diet.  Drinking too much alcohol.  Having long-term (chronic) kidney disease.  Having a family history of high blood pressure.  Age. Risk increases with age.  Race. You may be at higher risk if you are African American.  Gender. Men are at higher risk than women before age 23. After age 79, women are at higher risk than men.  Having obstructive sleep apnea.  Stress. What are the signs or symptoms?  High blood pressure may not cause symptoms. Very high blood pressure (hypertensive crisis) may cause: ? Headache. ? Feelings of worry or nervousness (anxiety). ? Shortness of breath. ? Nosebleed. ? A feeling of being sick to your stomach (nausea). ? Throwing up (vomiting). ? Changes in how you see. ? Very bad chest pain. ? Seizures. How is this treated?  This condition is treated by making healthy lifestyle changes, such as: ? Eating healthy foods. ? Exercising more. ? Drinking less alcohol.  Your health care provider may prescribe medicine if lifestyle changes are not enough to get your blood pressure  under control, and if: ? Your top number is above 130. ? Your bottom number is above 80.  Your personal target blood pressure may vary. Follow these instructions at home: Eating and drinking   If told, follow the DASH eating plan. To follow this plan: ? Fill one half of your plate at each meal with fruits and vegetables. ? Fill one fourth of your plate at each meal with whole grains. Whole grains include whole-wheat pasta, brown rice, and whole-grain bread. ? Eat or drink low-fat dairy products, such as skim milk or low-fat yogurt. ? Fill one fourth of your plate at each meal with low-fat (lean) proteins. Low-fat proteins include fish, chicken without skin, eggs, beans, and tofu. ? Avoid fatty meat, cured and processed meat, or chicken with skin. ? Avoid pre-made or processed food.  Eat less than 1,500 mg of salt each day.  Do not drink alcohol if: ? Your doctor tells you not to drink. ? You are pregnant, may be pregnant, or are planning to become pregnant.  If you drink alcohol: ? Limit how much you use to:  0-1 drink a day for women.  0-2 drinks a day for men. ? Be aware of how much alcohol is in your drink. In the [place] one drink equals one 12 oz bottle of beer (355 mL), one 5 oz glass of wine (148 mL), or one 1 oz glass of hard liquor (44 mL). Lifestyle   Work with your doctor to stay at a healthy weight or to lose weight.  Ask your doctor what the best weight is for you.  Get at least 30 minutes of exercise most days of the week. This may include walking, swimming, or biking.  Get at least 30 minutes of exercise that strengthens your muscles (resistance exercise) at least 3 days a week. This may include lifting weights or doing Pilates.  Do not use any products that contain nicotine or tobacco, such as cigarettes, e-cigarettes, and chewing tobacco. If you need help quitting, ask your doctor.  Check your blood pressure at home as told by your doctor.  Keep all follow-up  visits as told by your doctor. This is important. Medicines  Take over-the-counter and prescription medicines only as told by your doctor. Follow directions carefully.  Do not skip doses of blood pressure medicine. The medicine does not work as well if you skip doses. Skipping doses also puts you at risk for problems.  Ask your doctor about side effects or reactions to medicines that you should watch for. Contact a doctor if you:  Think you are having a reaction to the medicine you are taking.  Have headaches that keep coming back (recurring).  Feel dizzy.  Have swelling in your ankles.  Have trouble with your vision. Get help right away if you:  Get a very bad headache.  Start to feel mixed up (confused).  Feel weak or numb.  Feel faint.  Have very bad pain in your: ? Chest. ? Belly (abdomen).  Throw up more than once.  Have trouble breathing. Summary  Hypertension is another name for high blood pressure.  High blood pressure forces your heart to work harder to pump blood.  For most people, a normal blood pressure is less than 120/80.  Making healthy choices can help lower blood pressure. If your blood pressure does not get lower with healthy choices, you may need to take medicine. This information is not intended to replace advice given to you by your health care provider. Make sure you discuss any questions you have with your health care provider. Document Revised: 11/03/2017 Document Reviewed: 11/03/2017 Elsevier Patient Education  2020 [employer].

## 2019-05-24 NOTE — Assessment & Plan Note (Signed)
BP remains elevated today.  Titrate amlodipine to 10mg  daily.  Follow low salt diet.  Return in about 2 weeks (around 06/07/2019) for Nurse visit for BP check.

## 2019-05-24 NOTE — Assessment & Plan Note (Signed)
Chronic, referral to audiology ordered.

## 2019-05-24 NOTE — Progress Notes (Signed)
William Vincent - 54 y.o. male MRN [id number]  Date of birth: 06/25/1965  Subjective Chief Complaint  Patient presents with  . Hypertension    HPI William Vincent is a 54 y.o. male here today for follow up of HTN.  Lisinopril/hctz was discontinued previously as he was having side effects of sweats while taking this.  Started on amlodipine.  He is tolerating this better but BP remains high today.  He has gotten similar readings at home.  He denies chest pain, shortness of breath, palpitations, headache or vision changes.   He also reports decreased hearing in L ear for several years.  He would like to have this evaluated.  He may consider hearing aids.   ROS:  A comprehensive ROS was completed and negative except as noted per HPI  Allergies  Allergen Reactions  . Flexeril [Cyclobenzaprine] Hives and Rash    Past Medical History:  Diagnosis Date  . Gout   . Hypertension   . Kidney stones     Past Surgical History:  Procedure Laterality Date  . TONSILLECTOMY AND ADENOIDECTOMY      Social History   Socioeconomic History  . Marital status: Single    Spouse name: Not on file  . Number of children: Not on file  . Years of education: Not on file  . Highest education level: Not on file  Occupational History  . Not on file  Tobacco Use  . Smoking status: Never Smoker  . Smokeless tobacco: Never Used  Substance and Sexual Activity  . Alcohol use: No  . Drug use: No  . Sexual activity: Not on file  Other Topics Concern  . Not on file  Social History Narrative  . Not on file   Social Determinants of Health   Financial Resource Strain:   . Difficulty of Paying Living Expenses:   Food Insecurity:   . Worried About Programme researcher, broadcasting/film/video in the Last Year:   . Barista in the Last Year:   Transportation Needs:   . Freight forwarder (Medical):   Marland Kitchen Lack of Transportation (Non-Medical):   Physical Activity:   . Days of Exercise per Week:   . Minutes of Exercise per  Session:   Stress:   . Feeling of Stress :   Social Connections:   . Frequency of Communication with Friends and Family:   . Frequency of Social Gatherings with Friends and Family:   . Attends Religious Services:   . Active Member of Clubs or Organizations:   . Attends Banker Meetings:   Marland Kitchen Marital Status:     No family history on file.  Health Maintenance  Topic Date Due  . COLONOSCOPY  Never done  . TETANUS/TDAP  02/19/2027  . INFLUENZA VACCINE  Completed  . HIV Screening  Completed     ----------------------------------------------------------------------------------------------------------------------------------------------------------------------------------------------------------------- Physical Exam BP (!) 145/102   Pulse (!) 102   Temp 98 F (36.7 C) (Oral)   Ht 5\' 10"  (1.778 m)   Wt 285 lb (129.3 kg)   BMI 40.89 kg/m   Physical Exam Constitutional:      Appearance: Normal appearance.  HENT:     Head: Normocephalic and atraumatic.  Eyes:     General: No scleral icterus. Cardiovascular:     Rate and Rhythm: Normal rate and regular rhythm.  Pulmonary:     Effort: Pulmonary effort is normal.     Breath sounds: Normal breath sounds.  Musculoskeletal:     Cervical  back: Neck supple.  Skin:    General: Skin is warm and dry.  Neurological:     General: No focal deficit present.     Mental Status: He is alert.  Psychiatric:        Mood and Affect: Mood normal.        Behavior: Behavior normal.     ------------------------------------------------------------------------------------------------------------------------------------------------------------------------------------------------------------------- Assessment and Plan  HTN (hypertension) BP remains elevated today.  Titrate amlodipine to 10mg  daily.  Follow low salt diet.  Return in about 2 weeks (around 06/07/2019) for Nurse visit for BP check.   Hearing loss Chronic, referral  to audiology ordered.    Meds ordered this encounter  Medications  . amLODipine (NORVASC) 10 MG tablet    Sig: Take 1 tablet (10 mg total) by mouth daily.    Dispense:  90 tablet    Refill:  1    Return in about 2 weeks (around 06/07/2019) for Nurse visit for BP check.    This visit occurred during the SARS-CoV-2 public health emergency.  Safety protocols were in place, including screening questions prior to the visit, additional usage of staff PPE, and extensive cleaning of exam room while observing appropriate contact time as indicated for disinfecting solutions.

## 2019-05-25 DIAGNOSIS — M5442 Lumbago with sciatica, left side: Secondary | ICD-10-CM | POA: Diagnosis not present

## 2019-05-26 DIAGNOSIS — M5442 Lumbago with sciatica, left side: Secondary | ICD-10-CM | POA: Diagnosis not present

## 2019-05-27 DIAGNOSIS — M5442 Lumbago with sciatica, left side: Secondary | ICD-10-CM | POA: Diagnosis not present

## 2019-05-29 ENCOUNTER — Telehealth: Payer: Self-pay

## 2019-05-29 DIAGNOSIS — M5442 Lumbago with sciatica, left side: Secondary | ICD-10-CM | POA: Diagnosis not present

## 2019-05-29 NOTE — Telephone Encounter (Signed)
William Vincent states the Amlodipine is causing him to have a feeling of being hot and he has sweats. He stopped the medication yesterday. Denies shortness of breath, chest pain or dizziness.

## 2019-05-30 NOTE — Telephone Encounter (Signed)
BP reading yesterday was 135/105.  145/102. 145/89. On amlodipine and causing him sweats, denies headache, no chest pain or dizziness or chest pain. Please advise wife calls and states never heard anything back yesterday from MD.

## 2019-05-31 DIAGNOSIS — M5442 Lumbago with sciatica, left side: Secondary | ICD-10-CM | POA: Diagnosis not present

## 2019-05-31 NOTE — Telephone Encounter (Signed)
He states he had sweats with previous medications of lisinopril/hctz  as well.  Please have him follow up with me to discuss alternatives.

## 2019-05-31 NOTE — Telephone Encounter (Signed)
[name] called about the medication again, she stated his BP was still high. [name] is very upset that she has not heard back in two days. Spoke to rachel and told the patient that Dr. Ashley Royalty is looking into it and a nurse will get back to them soon. Denied shortness of breath, chest pain or dizziness.

## 2019-06-01 DIAGNOSIS — M5442 Lumbago with sciatica, left side: Secondary | ICD-10-CM | POA: Diagnosis not present

## 2019-06-02 ENCOUNTER — Other Ambulatory Visit: Payer: Self-pay

## 2019-06-02 ENCOUNTER — Ambulatory Visit: Payer: Self-pay

## 2019-06-02 ENCOUNTER — Encounter: Payer: Self-pay | Admitting: Family Medicine

## 2019-06-02 ENCOUNTER — Ambulatory Visit (INDEPENDENT_AMBULATORY_CARE_PROVIDER_SITE_OTHER): Payer: Medicaid Other | Admitting: Family Medicine

## 2019-06-02 ENCOUNTER — Ambulatory Visit (INDEPENDENT_AMBULATORY_CARE_PROVIDER_SITE_OTHER): Payer: Medicaid Other

## 2019-06-02 VITALS — BP 118/90 | HR 77 | Ht 70.0 in | Wt 285.0 lb

## 2019-06-02 DIAGNOSIS — M5136 Other intervertebral disc degeneration, lumbar region: Secondary | ICD-10-CM | POA: Diagnosis not present

## 2019-06-02 DIAGNOSIS — G8929 Other chronic pain: Secondary | ICD-10-CM

## 2019-06-02 DIAGNOSIS — M4316 Spondylolisthesis, lumbar region: Secondary | ICD-10-CM | POA: Diagnosis not present

## 2019-06-02 DIAGNOSIS — M25511 Pain in right shoulder: Secondary | ICD-10-CM

## 2019-06-02 DIAGNOSIS — M4306 Spondylolysis, lumbar region: Secondary | ICD-10-CM

## 2019-06-02 DIAGNOSIS — M5442 Lumbago with sciatica, left side: Secondary | ICD-10-CM | POA: Diagnosis not present

## 2019-06-02 NOTE — Patient Instructions (Addendum)
Thank you for coming in today. Get xray today.  Plan for MRI soon.  Recheck after MRI.   Work on those shoulder exercises.   Plan for MRI.   Recheck in about 1 month.

## 2019-06-02 NOTE — Progress Notes (Signed)
I, [name], LAT, ATC, am serving as scribe for Dr. Lynne Leader.  William Vincent is a 54 y.o. male who presents to [place] at [facility] today for B knee pain.  Patient was last seen February 26.  At that visit he had multiple different pain complaints but the visit was primarily focused on his knee pain.  He had a series of knee injections most recently left knee February 26 and a prior R knee injection on 04/03/19.  Additionally recommended quad strengthening. Patient is having a hard time with sit to stand. Patient notes numbness when he squats down. R>L. Constant pain but feels like his knees are unstable.    Is also having right shoulder pain. Has had injection previously which has helped with pain. Feels pain has recently increased.  He is not done much home exercises for this recently.  He cannot remember the exercises that he was taught in the past.   Pertinent review of systems: No fevers or chills  Relevant historical information: Hypertension obesity pars defect L-spine essential tremor.   Exam:  BP 118/90   Pulse 77   Ht 5\' 10"  (1.778 m)   Wt 285 lb (129.3 kg)   SpO2 98%   BMI 40.89 kg/m  General: Well Developed, well nourished, and in no acute distress.   MSK:  Right shoulder normal-appearing nontender decreased abduction range of motion.  Positive Hawkins and Neer's test decreased strength abduction 4/5.  L-spine nontender to midline.  Decreased lumbar motion.  Right lower leg nontender normal motion positive slump test right side.    Lab and Radiology Results  X-ray images obtained today personally and independently reviewed  Right shoulder: Possible old Hill-Sachs Fracture posterior humerus.  Mild DJD.Marland Kitchen   L-spine flexion and extension views: Grade 1 approaching grade 2 spondylolisthesis at L5-S1 worse with extension views  Await formal radiology review  Procedure: Real-time Ultrasound Guided Injection of right subacromial  bursa Device: Philips Affiniti 50G Images permanently stored and available for review in the ultrasound unit. Verbal informed consent obtained.  Discussed risks and benefits of procedure. Warned about infection bleeding damage to structures skin hypopigmentation and fat atrophy among others. Patient expresses understanding and agreement Time-out conducted.   Noted no overlying erythema, induration, or other signs of local infection.   Skin prepped in a sterile fashion.   Local anesthesia: Topical Ethyl chloride.   With sterile technique and under real time ultrasound guidance:  40 mg of Kenalog and 2 mL of Marcaine injected easily.   Completed without difficulty   Pain immediately resolved suggesting accurate placement of the medication.   Advised to call if fevers/chills, erythema, induration, drainage, or persistent bleeding.   Images permanently stored and available for review in the ultrasound unit.  Impression: Technically successful ultrasound guided injection.       Assessment and Plan: 54 y.o. male with  Persistent right shoulder pain.  X-ray somewhat abnormality today.  Await formal radiology review.  Repeat injection home exercise program reviewed in clinic by ATC.  Recheck 1 month.  Back pain radiating to right leg.  History of pars defects and spondylolisthesis.  A bit worse on today's x-rays than documented previously.  Patient has evidence of right L4 radiculopathy which is new.  His last MRI was over 2 years ago.  Plan for lumbar spine MRI for injection planning.  Recheck after MRI.   PDMP not reviewed this encounter. Orders Placed This Encounter  Procedures  . [place] LIMITED  JOINT SPACE STRUCTURES UP RIGHT    Standing Status:   Future    Number of Occurrences:   1    Standing Expiration Date:   08/01/2020    Order Specific Question:   Reason for Exam (SYMPTOM  OR DIAGNOSIS REQUIRED)    Answer:   right shoulder pain    Order Specific Question:   Preferred imaging  location?    Answer:   Adult nurse [facility]  . DG Lumb Spine Flex&Ext Only    Standing Status:   Future    Number of Occurrences:   1    Standing Expiration Date:   08/01/2020    Order Specific Question:   Reason for Exam (SYMPTOM  OR DIAGNOSIS REQUIRED)    Answer:   pain    Order Specific Question:   Preferred imaging location?    Answer:   [facility]    Order Specific Question:   Radiology Contrast Protocol - do NOT remove file path    Answer:   \\[email or web address]\Radiant\DXFluoroContrastProtocols.pdf  . DG Shoulder Right    Standing Status:   Future    Number of Occurrences:   1    Standing Expiration Date:   08/01/2020    Order Specific Question:   Reason for Exam (SYMPTOM  OR DIAGNOSIS REQUIRED)    Answer:   eval rt shoulder pain    Order Specific Question:   Preferred imaging location?    Answer:   [facility]    Order Specific Question:   Radiology Contrast Protocol - do NOT remove file path    Answer:   \\[email or web address]\Radiant\DXFluoroContrastProtocols.pdf  . [place] LIMITED JOINT SPACE STRUCTURES UP RIGHT(NO LINKED CHARGES)    Order Specific Question:   Reason for Exam (SYMPTOM  OR DIAGNOSIS REQUIRED)    Answer:   rt shoulder    Order Specific Question:   Preferred imaging location?    Answer:   Adult nurse [facility]  . MR Lumbar Spine Wo Contrast    Standing Status:   Future    Standing Expiration Date:   08/01/2020    Order Specific Question:   ** REASON FOR EXAM (FREE TEXT)    Answer:   eval right radiculopathy ?L4    Order Specific Question:   What is the patient's sedation requirement?    Answer:   No Sedation    Order Specific Question:   Does the patient have a pacemaker or implanted devices?    Answer:   No    Order Specific Question:   Preferred imaging location?    Answer:   [facility] (table limit-550lbs)    Order Specific Question:   Radiology Contrast Protocol - do NOT remove file path    Answer:    \\[email or web address]\[email or web address]\Radiant\[email or web address].PDF   No orders of the defined types were placed in this encounter.    Discussed warning signs or symptoms. Please see discharge instructions. Patient expresses understanding.   The above documentation has been reviewed and is accurate and complete [name]

## 2019-06-02 NOTE — Progress Notes (Signed)
X-ray L-spine shows continued disc slippage like we discussed.  MRI will be very helpful.

## 2019-06-02 NOTE — Progress Notes (Signed)
X-ray right shoulder shows no fracture.

## 2019-06-03 DIAGNOSIS — M5442 Lumbago with sciatica, left side: Secondary | ICD-10-CM | POA: Diagnosis not present

## 2019-06-05 DIAGNOSIS — M5442 Lumbago with sciatica, left side: Secondary | ICD-10-CM | POA: Diagnosis not present

## 2019-06-06 DIAGNOSIS — M5442 Lumbago with sciatica, left side: Secondary | ICD-10-CM | POA: Diagnosis not present

## 2019-06-07 ENCOUNTER — Ambulatory Visit: Payer: Medicaid Other

## 2019-06-07 DIAGNOSIS — M5442 Lumbago with sciatica, left side: Secondary | ICD-10-CM | POA: Diagnosis not present

## 2019-06-08 DIAGNOSIS — M5442 Lumbago with sciatica, left side: Secondary | ICD-10-CM | POA: Diagnosis not present

## 2019-06-09 DIAGNOSIS — M5442 Lumbago with sciatica, left side: Secondary | ICD-10-CM | POA: Diagnosis not present

## 2019-06-10 DIAGNOSIS — M5442 Lumbago with sciatica, left side: Secondary | ICD-10-CM | POA: Diagnosis not present

## 2019-06-12 DIAGNOSIS — M5442 Lumbago with sciatica, left side: Secondary | ICD-10-CM | POA: Diagnosis not present

## 2019-06-13 DIAGNOSIS — M5442 Lumbago with sciatica, left side: Secondary | ICD-10-CM | POA: Diagnosis not present

## 2019-06-14 DIAGNOSIS — H5213 Myopia, bilateral: Secondary | ICD-10-CM | POA: Diagnosis not present

## 2019-06-14 DIAGNOSIS — M5442 Lumbago with sciatica, left side: Secondary | ICD-10-CM | POA: Diagnosis not present

## 2019-06-15 DIAGNOSIS — M5442 Lumbago with sciatica, left side: Secondary | ICD-10-CM | POA: Diagnosis not present

## 2019-06-16 DIAGNOSIS — M5442 Lumbago with sciatica, left side: Secondary | ICD-10-CM | POA: Diagnosis not present

## 2019-06-17 DIAGNOSIS — M5442 Lumbago with sciatica, left side: Secondary | ICD-10-CM | POA: Diagnosis not present

## 2019-06-19 DIAGNOSIS — M5442 Lumbago with sciatica, left side: Secondary | ICD-10-CM | POA: Diagnosis not present

## 2019-06-20 DIAGNOSIS — M5442 Lumbago with sciatica, left side: Secondary | ICD-10-CM | POA: Diagnosis not present

## 2019-06-21 DIAGNOSIS — M5442 Lumbago with sciatica, left side: Secondary | ICD-10-CM | POA: Diagnosis not present

## 2019-06-22 DIAGNOSIS — M5442 Lumbago with sciatica, left side: Secondary | ICD-10-CM | POA: Diagnosis not present

## 2019-06-23 DIAGNOSIS — M5442 Lumbago with sciatica, left side: Secondary | ICD-10-CM | POA: Diagnosis not present

## 2019-06-24 ENCOUNTER — Other Ambulatory Visit: Payer: Medicaid Other

## 2019-06-26 DIAGNOSIS — M5442 Lumbago with sciatica, left side: Secondary | ICD-10-CM | POA: Diagnosis not present

## 2019-06-27 DIAGNOSIS — M5442 Lumbago with sciatica, left side: Secondary | ICD-10-CM | POA: Diagnosis not present

## 2019-06-28 DIAGNOSIS — M5442 Lumbago with sciatica, left side: Secondary | ICD-10-CM | POA: Diagnosis not present

## 2019-06-29 DIAGNOSIS — M5442 Lumbago with sciatica, left side: Secondary | ICD-10-CM | POA: Diagnosis not present

## 2019-06-30 DIAGNOSIS — M5442 Lumbago with sciatica, left side: Secondary | ICD-10-CM | POA: Diagnosis not present

## 2019-07-01 DIAGNOSIS — M5442 Lumbago with sciatica, left side: Secondary | ICD-10-CM | POA: Diagnosis not present

## 2019-07-03 ENCOUNTER — Ambulatory Visit: Payer: Medicaid Other | Admitting: Family Medicine

## 2019-07-03 DIAGNOSIS — M5442 Lumbago with sciatica, left side: Secondary | ICD-10-CM | POA: Diagnosis not present

## 2019-07-04 DIAGNOSIS — M5442 Lumbago with sciatica, left side: Secondary | ICD-10-CM | POA: Diagnosis not present

## 2019-07-05 DIAGNOSIS — M5442 Lumbago with sciatica, left side: Secondary | ICD-10-CM | POA: Diagnosis not present

## 2019-07-06 ENCOUNTER — Encounter: Payer: Self-pay | Admitting: Neurology

## 2019-07-06 DIAGNOSIS — M5442 Lumbago with sciatica, left side: Secondary | ICD-10-CM | POA: Diagnosis not present

## 2019-07-07 ENCOUNTER — Telehealth: Payer: Self-pay | Admitting: Family Medicine

## 2019-07-07 ENCOUNTER — Ambulatory Visit (INDEPENDENT_AMBULATORY_CARE_PROVIDER_SITE_OTHER): Payer: Medicaid Other | Admitting: Family Medicine

## 2019-07-07 ENCOUNTER — Other Ambulatory Visit: Payer: Self-pay | Admitting: Family Medicine

## 2019-07-07 ENCOUNTER — Other Ambulatory Visit: Payer: Self-pay

## 2019-07-07 ENCOUNTER — Encounter: Payer: Self-pay | Admitting: Family Medicine

## 2019-07-07 VITALS — BP 138/113 | HR 101

## 2019-07-07 VITALS — BP 104/104 | HR 114 | Ht 70.0 in | Wt 287.6 lb

## 2019-07-07 DIAGNOSIS — M5442 Lumbago with sciatica, left side: Secondary | ICD-10-CM

## 2019-07-07 DIAGNOSIS — M4306 Spondylolysis, lumbar region: Secondary | ICD-10-CM | POA: Diagnosis not present

## 2019-07-07 DIAGNOSIS — M5441 Lumbago with sciatica, right side: Secondary | ICD-10-CM | POA: Diagnosis not present

## 2019-07-07 DIAGNOSIS — G250 Essential tremor: Secondary | ICD-10-CM

## 2019-07-07 DIAGNOSIS — G8929 Other chronic pain: Secondary | ICD-10-CM

## 2019-07-07 DIAGNOSIS — I10 Essential (primary) hypertension: Secondary | ICD-10-CM | POA: Diagnosis not present

## 2019-07-07 MED ORDER — PREGABALIN 200 MG PO CAPS: 200.0000 mg | ORAL_CAPSULE | Freq: Two times a day (BID) | ORAL | 1 refills | Status: DC

## 2019-07-07 MED ORDER — HYDROCODONE-ACETAMINOPHEN 5-325 MG PO TABS: 1.0000 | ORAL_TABLET | Freq: Every day | ORAL | 0 refills | Status: DC | PRN

## 2019-07-07 MED ORDER — LISINOPRIL 10 MG PO TABS: 10.0000 mg | ORAL_TABLET | Freq: Every day | ORAL | 1 refills | Status: DC

## 2019-07-07 MED ORDER — PRIMIDONE 250 MG PO TABS: 500.0000 mg | ORAL_TABLET | Freq: Every day | ORAL | 1 refills | Status: DC

## 2019-07-07 MED FILL — LISINOPRIL 10 MG TABS: 10 | 90 days supply | Qty: 90 | Fill #0

## 2019-07-07 MED FILL — PRIMIDONE 250 MG TABS: 250 | 90 days supply | Qty: 180 | Fill #0

## 2019-07-07 NOTE — Telephone Encounter (Signed)
[facility] called checking on the Hydrocodone prescription that was sent in. They generally do not fill a full 30 day supply when a patient has not been taking it regularly. They will fill one week for him while he is there in the pharmacy today and we will need to send in further medication when okay by Dr [name].

## 2019-07-07 NOTE — Patient Instructions (Signed)
Stay on Amlodipine 5 mg daily.  We are adding Lisinopril 10 mg daily.   Appt made for next week to discuss mood with Dr. Ashley Royalty.

## 2019-07-07 NOTE — Addendum Note (Signed)
Addended by: Rodolph Bong on: 07/07/2019 12:21 PM   Modules accepted: Orders

## 2019-07-07 NOTE — Patient Instructions (Signed)
Thank you for coming in today.  Plan to increase primidrone to 500 at night,  Take with Dr Ashley Royalty about anxiety.  Use limited hydrocodone for pain. Up 1 pill daily.  Plan for MRI. I will contact you after MRI. And we will come up with a plan.

## 2019-07-07 NOTE — Progress Notes (Signed)
Patient comes in for blood pressure check. His amlodipine was increased from 5 mg to 10 mg at his visit on 05/24/2019. He states he took it for about a week then had the same issues with sweating as he did with Lisinopril/HCTZ. He decreased back to 5 mg daily.   He complains of shortness of breath, some chest pains that feel like heart burn, occasional palpitations and dizziness. He has had a EKG in the past.   First blood pressure reading today is 131/102, HR 102.  He sat for 10 minutes and second reading is 138/113, HR 101. He has been checking at home, but not keeping a log. He states last night his blood pressure was 144/116. He states this is the average reading.   He is also complaining of increased depression and anxiety. He saw Dr. [name] in sports medicine who states was going to send a note to PCP.   Depression screen [facility] 2/9 07/07/2019 10/18/2018 10/04/2018  Decreased Interest 3 3 3   Down, Depressed, Hopeless 3 3 3   PHQ - 2 Score 6 6 6   Altered sleeping 3 3 3   Tired, decreased energy 3 2 2   Change in appetite 3 3 2   Feeling bad or failure about yourself  3 3 3   Trouble concentrating 3 3 2   Moving slowly or fidgety/restless 3 3 1   Suicidal thoughts 2 2 2   PHQ-9 Score 26 25 21   Difficult doing work/chores Extremely dIfficult Very difficult Somewhat difficult  Some recent data might be hidden   GAD 7 : Generalized Anxiety Score 07/07/2019 10/18/2018 10/04/2018 09/20/2018  Nervous, Anxious, on Edge 3 3 2 3   Control/stop worrying 3 3 2 3   Worry too much - different things 3 3 2 3   Trouble relaxing 3 3 2 3   Restless 3 3 2 2   Easily annoyed or irritable 3 3 2 3   Afraid - awful might happen 3 3 2 3   Total GAD 7 Score 21 21 14 20   Anxiety Difficulty Extremely difficult Extremely difficult Extremely difficult Extremely difficult   Spoke with Dr. who advised to add Lisinopril 10 mg daily, continue Amlodipine 5 mg, and follow up next week with him to discuss depression/anxiety.    Patient made aware. Appt made for Monday. He was instructed to seek care at emergency behavioral health with any SI.

## 2019-07-07 NOTE — Telephone Encounter (Signed)
Called pt and changed his pharmacy location to [facility] so he can use the [employer] coupon to get both the Lyrica and [employer]

## 2019-07-07 NOTE — Progress Notes (Addendum)
I, [name], LAT, ATC, am serving as scribe for Dr. [name].  William Vincent is a 54 y.o. male who presents to [facility] today for f/u of B knee and B hand pain and LBP.  He was last seen on 06/02/19 and had a R subacromial injection.  He had a previous L knee injection on 05/05/19 and a R knee injection on 04/03/19.  Since his last visit, pt reports that he has noticed "shaking" and weakness in his B hands.  He also reports worsening low back pain and B leg pain.  He notes numbness in both of his legs, particularly w/ prolonged sitting.  He is also having issues w/ his balance.  Hands shaking a lot.  Currently taking Primidrone 250mg  nightly.  He notes this was helpful but it started to be less helpful now.   Pertinent review of systems: No fevers or chills  Relevant historical information: Hypertension chronic pain anxiety   Exam:  BP (!) 104/104 (BP Location: Left Arm, Patient Position: Sitting, Cuff Size: Large)   Pulse (!) 114   Ht 5\' 10"  (1.778 m)   Wt 287 lb 9.6 oz (130.5 kg)   SpO2 96%   BMI 41.27 kg/m  General: Well Developed, well nourished, and in no acute distress.   MSK:  Hands bilaterally slight tremor with motion.  Intact grip strength. L-spine nontender decreased motion.  Lower extremity strength decreased bilaterally.    Lab and Radiology Results DG Shoulder Right  Result Date: 06/02/2019 CLINICAL DATA:  Chronic right shoulder pain. EXAM: RIGHT SHOULDER - 2+ VIEW COMPARISON:  None. FINDINGS: There is no evidence of fracture or dislocation. There is no evidence of arthropathy or other focal bone abnormality. Soft tissues are unremarkable. IMPRESSION: Normal exam. Electronically Signed   By: M.D.   On: 06/02/2019 12:59   DG Lumb Spine Flex&Ext Only  Result Date: 06/02/2019 CLINICAL DATA:  Chronic back pain. Pars defects at L5. M43.06 EXAM: LUMBAR SPINE FLEX AND EXTEND ONLY - 2-3 VIEW COMPARISON:  Radiographs  dated 12/15/2017 and lumbar MRI dated 11/01/2017 FINDINGS: There is a 6 mm spondylolisthesis of L5 on S1 without disc space narrowing. This is unchanged since the prior study of 12/15/2017. The spondylolisthesis does not change with extension but does slightly reduce with flexion to 5 mm. Bilateral pars defects at L5 are again noted. The remainder of the lumbar spine is e unchanged with slight narrowing of the posterior aspect of the L4-5 disc space. IMPRESSION: Bilateral pars defects at L5 with grade 1 spondylolisthesis at L5-S1. The spondylolisthesis does not change significantly with flexion or extension. Electronically Signed   By: 11/03/2017 M.D.   On: 06/02/2019 12:57   Francene Boyers LIMITED JOINT SPACE STRUCTURES UP RIGHT  Result Date: 06/23/2019 Procedure: Real-time Ultrasound Guided Injection of right subacromial bursa Device: Philips Affiniti 50G Images permanently stored and available for review in the ultrasound unit. Verbal informed consent obtained. Discussed risks and benefits of procedure. Warned about infection bleeding damage to structures skin hypopigmentation and fat atrophy among others. Patient expresses understanding and agreement Time-out conducted.  Noted no overlying erythema, induration, or other signs of local infection.  Skin prepped in a sterile fashion.  Local anesthesia: Topical Ethyl chloride.  With sterile technique and under real time ultrasound guidance: 40 mg of Kenalog and 2 mL of Marcaine injected easily.  Completed without difficulty  Pain immediately resolved suggesting accurate placement of the medication.  Advised to  call if fevers/chills, erythema, induration, drainage, or persistent bleeding.  Images permanently stored and available for review in the ultrasound unit. Impression: Technically successful ultrasound guided injection.  DG Knee 4 Views W/Patella Left  Result Date: 05/05/2019 CLINICAL DATA:  Acute LEFT knee pain EXAM: LEFT KNEE - COMPLETE 4+ VIEW  COMPARISON:  10/20/2017 FINDINGS: Osseous mineralization normal. Joint spaces preserved. No acute fracture, dislocation, or bone destruction. No definite joint effusion. IMPRESSION: No acute osseous abnormalities. Electronically Signed   By: Ulyses Southward M.D.   On: 05/05/2019 16:25   [place] LIMITED JOINT SPACE STRUCTURES LOW LEFT(NO LINKED CHARGES)  Result Date: 05/09/2019 Procedure: Real-time Ultrasound Guided Injection of left knee Device: Philips Affiniti 50G Images permanently stored and available for review in the ultrasound unit. Verbal informed consent obtained. Discussed risks and benefits of procedure. Warned about infection bleeding damage to structures skin hypopigmentation and fat atrophy among others. Patient expresses understanding and agreement Time-out conducted.  Noted no overlying erythema, induration, or other signs of local infection.  Skin prepped in a sterile fashion.  Local anesthesia: Topical Ethyl chloride.  With sterile technique and under real time ultrasound guidance: 40 mg of Kenalog and 4 mL of Marcaine injected easily.  Completed without difficulty  Pain immediately resolved suggesting accurate placement of the medication.  Advised to call if fevers/chills, erythema, induration, drainage, or persistent bleeding.  Images permanently stored and available for review in the ultrasound unit. Impression: Technically successful ultrasound guided injection.  [place] LIMITED JOINT SPACE STRUCTURES UP RIGHT(NO LINKED CHARGES)  Result Date: 06/02/2019 Procedure: Real-time Ultrasound Guided Injection of right subacromial bursa Device: Philips Affiniti 50G Images permanently stored and available for review in the ultrasound unit. Verbal informed consent obtained. Discussed risks and benefits of procedure. Warned about infection bleeding damage to structures skin hypopigmentation and fat atrophy among others. Patient expresses understanding and agreement Time-out conducted.  Noted no  overlying erythema, induration, or other signs of local infection.  Skin prepped in a sterile fashion.  Local anesthesia: Topical Ethyl chloride.  With sterile technique and under real time ultrasound guidance: 40 mg of Kenalog and 2 mL of Marcaine injected easily.  Completed without difficulty  Pain immediately resolved suggesting accurate placement of the medication.  Advised to call if fevers/chills, erythema, induration, drainage, or persistent bleeding.  Images permanently stored and available for review in the ultrasound unit. Impression: Technically successful ultrasound guided injection.  I, [name], personally (independently) visualized and performed the interpretation of the images attached in this note.     Assessment and Plan: 54 y.o. male with chronic low back pain with lumbar radiculopathy.  But the last visit discussed this.  Plan for MRI.  MRI scheduled May 11.  Anticipate epidural steroid injections likely after MRI.  Will call patient with results and discuss next steps.  Essential tremor: Not as well-controlled as I would like.  Plan to increase primidone to 500 mg at night.  Chronic pain: Not well controlled.  Discussed options.  At this point he is exhausted typical management.  In the past he was on limited hydrocodone.  Prescribe limited hydrocodone and see if it helps any.  Goal for increased activity.  SENT NORCO AND LYRICA to different pharmacy due to cost.  PDMP reviewed during this encounter. No orders of the defined types were placed in this encounter.  Meds ordered this encounter  Medications  . primidone (MYSOLINE) 250 MG tablet    Sig: Take 2 tablets (500 mg total) by mouth at bedtime.  For tremor    Dispense:  180 tablet    Refill:  1  . HYDROcodone-acetaminophen (NORCO/VICODIN) 5-325 MG tablet    Sig: Take 1 tablet by mouth daily as needed.    Dispense:  30 tablet    Refill:  0  . pregabalin (LYRICA) 200 MG capsule    Sig: Take 1 capsule (200  mg total) by mouth 2 (two) times daily.    Dispense:  180 capsule    Refill:  1     Discussed warning signs or symptoms. Please see discharge instructions. Patient expresses understanding.   The above documentation has been reviewed and is accurate and complete Lynne Leader

## 2019-07-07 NOTE — Telephone Encounter (Signed)
Patient came back into the office before leaving. He said that he contacted the pharmacy and they are not able to fill the Lyrica because it is early but he would like to get it filled while he was out in the area. He said that the pharmacy would only be able to fill it if the RX was changed.  They are also not able to fill the Hydrocodone because Medicaid would not cover it.  Can you help with this?

## 2019-07-07 NOTE — Progress Notes (Signed)
Medical screening examination/treatment was performed by qualified clinical staff member and as supervising physician I was immediately available for consultation/collaboration. I have reviewed documentation and agree with assessment and plan.  Cristiano Capri, DO  

## 2019-07-07 NOTE — Telephone Encounter (Signed)
It looks like we should probably sent to the [facility] pharmacy where it may be cheaper to use GoodRx.  Please contact patient and see if he like me to send it to the [place] pharmacy where he lives.

## 2019-07-09 ENCOUNTER — Encounter: Payer: Self-pay | Admitting: Family Medicine

## 2019-07-10 ENCOUNTER — Ambulatory Visit (INDEPENDENT_AMBULATORY_CARE_PROVIDER_SITE_OTHER): Payer: Medicaid Other

## 2019-07-10 ENCOUNTER — Encounter: Payer: Self-pay | Admitting: Family Medicine

## 2019-07-10 ENCOUNTER — Other Ambulatory Visit: Payer: Self-pay

## 2019-07-10 ENCOUNTER — Ambulatory Visit: Payer: Medicaid Other | Admitting: Family Medicine

## 2019-07-10 ENCOUNTER — Ambulatory Visit: Payer: Medicaid Other | Attending: Family Medicine | Admitting: Audiologist

## 2019-07-10 VITALS — BP 131/88 | HR 89 | Ht 70.08 in | Wt 291.1 lb

## 2019-07-10 DIAGNOSIS — M79622 Pain in left upper arm: Secondary | ICD-10-CM

## 2019-07-10 DIAGNOSIS — S4992XA Unspecified injury of left shoulder and upper arm, initial encounter: Secondary | ICD-10-CM | POA: Diagnosis not present

## 2019-07-10 DIAGNOSIS — I10 Essential (primary) hypertension: Secondary | ICD-10-CM

## 2019-07-10 DIAGNOSIS — M25512 Pain in left shoulder: Secondary | ICD-10-CM | POA: Diagnosis not present

## 2019-07-10 DIAGNOSIS — F418 Other specified anxiety disorders: Secondary | ICD-10-CM | POA: Diagnosis not present

## 2019-07-10 MED ORDER — BUPROPION HCL ER (XL) 150 MG PO TB24: 150.0000 mg | ORAL_TABLET | Freq: Every day | ORAL | 1 refills | Status: AC

## 2019-07-10 MED ORDER — HYDROXYZINE HCL 25 MG PO TABS: 12.5000 mg | ORAL_TABLET | Freq: Three times a day (TID) | ORAL | 0 refills | Status: AC | PRN

## 2019-07-10 MED FILL — buPROPion HCL ER (XL) 150 M: 150 | 90 days supply | Qty: 90 | Fill #0

## 2019-07-10 MED FILL — hydrOXYzine HCL 25 MG TABS: 25 | 15 days supply | Qty: 45 | Fill #0

## 2019-07-10 NOTE — Assessment & Plan Note (Signed)
Continue effexor and elavil  Adding bupropion in addition to above.   Return in about 4 weeks (around 08/07/2019) for Depression/Anxiety.

## 2019-07-10 NOTE — Patient Instructions (Signed)
Continue lisinopril and amlodipine at current dose for blood pressure.   Continue venlafaxine at current dose.  Start bupropion 150mg  daily.   Start hydroxyzine 1/2-1 tab every 8 hours as needed for anxiety.   Have xray of arm done.   See me again in about 4 weeks.    Major Depressive Disorder, Adult Major depressive disorder (MDD) is a mental health condition. MDD often makes you feel sad, hopeless, or helpless. MDD can also cause symptoms in your body. MDD can affect your:  Work.  School.  Relationships.  Other normal activities. MDD can range from mild to very bad. It may occur once (single episode MDD). It can also occur many times (recurrent MDD). The main symptoms of MDD often include:  Feeling sad, depressed, or irritable most of the time.  Loss of interest. MDD symptoms also include:  Sleeping too much or too little.  Eating too much or too little.  A change in your weight.  Feeling tired (fatigue) or having low energy.  Feeling worthless.  Feeling guilty.  Trouble making decisions.  Trouble thinking clearly.  Thoughts of suicide or harming others.  Feeling weak.  Feeling agitated.  Keeping yourself from being around other people (isolation). Follow these instructions at home: Activity  Do these things as told by your doctor: ? Go back to your normal activities. ? Exercise regularly. ? Spend time outdoors. Alcohol  Talk with your doctor about how alcohol can affect your antidepressant medicines.  Do not drink alcohol. Or, limit how much alcohol you drink. ? This means no more than 1 drink a day for nonpregnant women and 2 drinks a day for men. One drink equals one of these:  12 oz of beer.  5 oz of wine.  1 oz of hard liquor. General instructions  Take over-the-counter and prescription medicines only as told by your doctor.  Eat a healthy diet.  Get plenty of sleep.  Find activities that you enjoy. Make time to do them.  Think  about joining a support group. Your doctor may be able to suggest a group for you.  Keep all follow-up visits as told by your doctor. This is important. Where to find more information:  on Mental Illness: ? www.nami.org  [place] The [employer] of Mental Health: ? [employer]  National Suicide Prevention Lifeline: ? 213 074 0605. This is free, 24-hour help. Contact a doctor if:  Your symptoms get worse.  You have new symptoms. Get help right away if:  You self-harm.  You see, hear, taste, smell, or feel things that are not present (hallucinate). If you ever feel like you may hurt yourself or others, or have thoughts about taking your own life, get help right away. You can go to your nearest emergency department or call:  Your local emergency services (911 in the [place]  A suicide crisis helpline, such as the National Suicide Prevention Lifeline: ? 9702473371. This is open 24 hours a day. This information is not intended to replace advice given to you by your health care provider. Make sure you discuss any questions you have with your health care provider. Document Revised: 02/05/2017 Document Reviewed: 11/10/2015 Elsevier Patient Education  2020 01/10/2016.

## 2019-07-10 NOTE — Assessment & Plan Note (Signed)
Xrays ordered of L humerus.

## 2019-07-10 NOTE — Assessment & Plan Note (Signed)
Blood pressure is at goal at for age and co-morbidities.  I recommend he continue lisinopril and amlodipine at current dose.  In addition they were instructed to follow a low sodium diet with regular exercise to help to maintain adequate control of blood pressure.

## 2019-07-10 NOTE — Progress Notes (Signed)
William Vincent - 54 y.o. male MRN [id number]  Date of birth: Dec 02, 1965  Subjective Chief Complaint  Patient presents with  . Hypertension    HPI William Vincent is a 54 y.o. male with history of HTN, chronic knee and back pain, and depression with anxiety here today for follow up.   -HTN:  He has tried several medications for HTN but reports having increased sweats with several of these.  Currently on lisinopril and amlodipine. He is doing well at current dose of each of these.  He denies chest pain, shortness of breath, palpitations, headache   -Depression with anxiety:  He has history of depression current management with effexor xr 150mg .  He also has elavil at bedtime.  Reports increased depression and anxiety recently related to chronic pain and stress.    Depression screen [facility] 2/9 07/10/2019 07/07/2019 10/18/2018  Decreased Interest 3 3 3   Down, Depressed, Hopeless 3 3 3   PHQ - 2 Score 6 6 6   Altered sleeping 3 3 3   Tired, decreased energy 3 3 2   Change in appetite 3 3 3   Feeling bad or failure about yourself  3 3 3   Trouble concentrating 0 3 3  Moving slowly or fidgety/restless 3 3 3   Suicidal thoughts 0 2 2  PHQ-9 Score 21 26 25   Difficult doing work/chores Extremely dIfficult Extremely dIfficult Very difficult  Some recent data might be hidden   GAD 7 : Generalized Anxiety Score 07/10/2019 07/07/2019 10/18/2018 10/04/2018  Nervous, Anxious, on Edge 3 3 3 2   Control/stop worrying 3 3 3 2   Worry too much - different things 3 3 3 2   Trouble relaxing 3 3 3 2   Restless 3 3 3 2   Easily annoyed or irritable 3 3 3 2   Afraid - awful might happen 3 3 3 2   Total GAD 7 Score 21 21 21 14   Anxiety Difficulty Extremely difficult Extremely difficult Extremely difficult Extremely difficult      -Fall:  Reports having fall while getting out of bed in the middle of the night.  This occurred 2 nights ago.  States that his legs just gave out.  He denies dizziness or LOC.  He did hit face on  nightstand.  He hit his L shoulder and arm and has pain with movement.  Denies swelling.  Allergies  Allergen Reactions  . Lisinopril-Hydrochlorothiazide     sweats  . Flexeril [Cyclobenzaprine] Hives and Rash    Past Medical History:  Diagnosis Date  . Gout   . Hypertension   . Kidney stones     Past Surgical History:  Procedure Laterality Date  . TONSILLECTOMY AND ADENOIDECTOMY      Social History   Socioeconomic History  . Marital status: Single    Spouse name: Not on file  . Number of children: Not on file  . Years of education: Not on file  . Highest education level: Not on file  Occupational History  . Not on file  Tobacco Use  . Smoking status: Never Smoker  . Smokeless tobacco: Never Used  Substance and Sexual Activity  . Alcohol use: No  . Drug use: No  . Sexual activity: Not on file  Other Topics Concern  . Not on file  Social History Narrative  . Not on file   Social Determinants of Health   Financial Resource Strain:   . Difficulty of Paying Living Expenses:   Food Insecurity:   . Worried About in the  Last Year:   . [place] in the Last Year:   Transportation Needs:   . Film/video editor (Medical):   Marland Kitchen Lack of Transportation (Non-Medical):   Physical Activity:   . Days of Exercise per Week:   . Minutes of Exercise per Session:   Stress:   . Feeling of Stress :   Social Connections:   . Frequency of Communication with Friends and Family:   . Frequency of Social Gatherings with Friends and Family:   . Attends Religious Services:   . Active Member of Clubs or Organizations:   . Attends Archivist Meetings:   Marland Kitchen Marital Status:     History reviewed. No pertinent family history.  Health Maintenance  Topic Date Due  . COVID-19 Vaccine (1) 07/23/2019 (Originally 08/25/1983)  . COLONOSCOPY  07/06/2020 (Originally 08/24/2017)  . INFLUENZA VACCINE  10/08/2019  . TETANUS/TDAP  02/19/2027  . HIV Screening   Completed     ----------------------------------------------------------------------------------------------------------------------------------------------------------------------------------------------------------------- Physical Exam BP 131/88 (BP Location: Left Arm, Patient Position: Sitting, Cuff Size: Large)   Pulse 89   Ht 5' 10.08" (1.78 m)   Wt 291 lb 1.6 oz (132 kg)   SpO2 95%   BMI 41.67 kg/m   Physical Exam Constitutional:      Appearance: Normal appearance.  HENT:     Head: Normocephalic and atraumatic.  Cardiovascular:     Rate and Rhythm: Normal rate and regular rhythm.  Pulmonary:     Effort: Pulmonary effort is normal.     Breath sounds: Normal breath sounds.  Musculoskeletal:     Cervical back: Neck supple.     Comments: ttp along L proximal humerus.  ROM limited due to pain.  No bruising noted.   Skin:    General: Skin is warm and dry.  Neurological:     General: No focal deficit present.     Mental Status: He is alert.  Psychiatric:        Mood and Affect: Mood normal.        Behavior: Behavior normal.     ------------------------------------------------------------------------------------------------------------------------------------------------------------------------------------------------------------------- Assessment and Plan  Anxiety with depression Continue effexor and elavil  Adding bupropion in addition to above.   Return in about 4 weeks (around 08/07/2019) for Depression/Anxiety.   HTN (hypertension) Blood pressure is at goal at for age and co-morbidities.  I recommend he continue lisinopril and amlodipine at current dose.  In addition they were instructed to follow a low sodium diet with regular exercise to help to maintain adequate control of blood pressure.    Left upper arm pain Xrays ordered of L humerus.    Meds ordered this encounter  Medications  . buPROPion (WELLBUTRIN XL) 150 MG 24 hr tablet    Sig: Take 1 tablet  (150 mg total) by mouth daily.    Dispense:  90 tablet    Refill:  1  . hydrOXYzine (ATARAX/VISTARIL) 25 MG tablet    Sig: Take 0.5-1 tablets (12.5-25 mg total) by mouth 3 (three) times daily as needed.    Dispense:  45 tablet    Refill:  0    Return in about 4 weeks (around 08/07/2019) for Depression/Anxiety.    This visit occurred during the SARS-CoV-2 public health emergency.  Safety protocols were in place, including screening questions prior to the visit, additional usage of staff PPE, and extensive cleaning of exam room while observing appropriate contact time as indicated for disinfecting solutions.

## 2019-07-11 DIAGNOSIS — M5442 Lumbago with sciatica, left side: Secondary | ICD-10-CM | POA: Diagnosis not present

## 2019-07-12 DIAGNOSIS — M5442 Lumbago with sciatica, left side: Secondary | ICD-10-CM | POA: Diagnosis not present

## 2019-07-13 DIAGNOSIS — M5442 Lumbago with sciatica, left side: Secondary | ICD-10-CM | POA: Diagnosis not present

## 2019-07-14 DIAGNOSIS — M5442 Lumbago with sciatica, left side: Secondary | ICD-10-CM | POA: Diagnosis not present

## 2019-07-15 DIAGNOSIS — M5442 Lumbago with sciatica, left side: Secondary | ICD-10-CM | POA: Diagnosis not present

## 2019-07-17 DIAGNOSIS — M5442 Lumbago with sciatica, left side: Secondary | ICD-10-CM | POA: Diagnosis not present

## 2019-07-18 ENCOUNTER — Other Ambulatory Visit: Payer: Self-pay

## 2019-07-18 ENCOUNTER — Ambulatory Visit
Admission: RE | Admit: 2019-07-18 | Discharge: 2019-07-18 | Disposition: A | Discharge status: Home / Self Care | Payer: Medicaid Other | Source: Ambulatory Visit | Attending: Family Medicine | Admitting: Family Medicine

## 2019-07-18 DIAGNOSIS — M48061 Spinal stenosis, lumbar region without neurogenic claudication: Secondary | ICD-10-CM | POA: Diagnosis not present

## 2019-07-18 DIAGNOSIS — M5442 Lumbago with sciatica, left side: Secondary | ICD-10-CM | POA: Diagnosis not present

## 2019-07-18 DIAGNOSIS — M4316 Spondylolisthesis, lumbar region: Secondary | ICD-10-CM

## 2019-07-19 ENCOUNTER — Encounter: Payer: Self-pay | Admitting: Family Medicine

## 2019-07-19 DIAGNOSIS — M5442 Lumbago with sciatica, left side: Secondary | ICD-10-CM | POA: Diagnosis not present

## 2019-07-20 ENCOUNTER — Encounter: Payer: Self-pay | Admitting: Family Medicine

## 2019-07-20 ENCOUNTER — Other Ambulatory Visit: Payer: Self-pay | Admitting: Family Medicine

## 2019-07-20 DIAGNOSIS — M5442 Lumbago with sciatica, left side: Secondary | ICD-10-CM | POA: Diagnosis not present

## 2019-07-20 NOTE — Progress Notes (Signed)
Lumbar spine MRI showed medium arthritis in some of the joints in the back but did not show significant pinched nerve.

## 2019-07-21 ENCOUNTER — Other Ambulatory Visit: Payer: Self-pay | Admitting: Family Medicine

## 2019-07-21 DIAGNOSIS — M5442 Lumbago with sciatica, left side: Secondary | ICD-10-CM | POA: Diagnosis not present

## 2019-07-21 MED ORDER — METOPROLOL SUCCINATE ER 25 MG PO TB24: 25.0000 mg | ORAL_TABLET | Freq: Every day | ORAL | 1 refills | Status: AC

## 2019-07-21 NOTE — Telephone Encounter (Signed)
Addressed in different phone note.  

## 2019-07-21 NOTE — Telephone Encounter (Signed)
Routing to provider  

## 2019-07-22 DIAGNOSIS — M5442 Lumbago with sciatica, left side: Secondary | ICD-10-CM | POA: Diagnosis not present

## 2019-07-24 DIAGNOSIS — M5442 Lumbago with sciatica, left side: Secondary | ICD-10-CM | POA: Diagnosis not present

## 2019-07-25 DIAGNOSIS — M5442 Lumbago with sciatica, left side: Secondary | ICD-10-CM | POA: Diagnosis not present

## 2019-07-26 ENCOUNTER — Encounter: Payer: Self-pay | Admitting: Family Medicine

## 2019-07-26 ENCOUNTER — Ambulatory Visit (INDEPENDENT_AMBULATORY_CARE_PROVIDER_SITE_OTHER): Payer: Medicaid Other | Admitting: Family Medicine

## 2019-07-26 DIAGNOSIS — M791 Myalgia, unspecified site: Secondary | ICD-10-CM

## 2019-07-26 DIAGNOSIS — M4306 Spondylolysis, lumbar region: Secondary | ICD-10-CM | POA: Diagnosis not present

## 2019-07-26 DIAGNOSIS — M5442 Lumbago with sciatica, left side: Secondary | ICD-10-CM | POA: Diagnosis not present

## 2019-07-26 DIAGNOSIS — M545 Low back pain: Secondary | ICD-10-CM

## 2019-07-26 DIAGNOSIS — G8929 Other chronic pain: Secondary | ICD-10-CM

## 2019-07-26 MED ORDER — PREGABALIN 200 MG PO CAPS: 200.0000 mg | ORAL_CAPSULE | Freq: Two times a day (BID) | ORAL | 1 refills | Status: DC

## 2019-07-26 MED ORDER — HYDROCODONE-ACETAMINOPHEN 5-325 MG PO TABS: 1.0000 | ORAL_TABLET | Freq: Every day | ORAL | 0 refills | Status: DC | PRN

## 2019-07-26 NOTE — Progress Notes (Signed)
    Virtual Visit  via Phone Note   I connected with [name] today by a  telemedicine application and verified that I am speaking with the correct person using two identifiers. Location patient: home Location provider: work Persons Participating in the virtual visit: patient, provider   I discussed the limitations, risks, security and privacy concerns of performing an evaluation and management service by telephone and the availability of in person appointments. I also discussed with the patient that there may be a patient responsible charge related to this service. The patient expressed understanding and agreed to proceed.    I discussed the limitations of evaluation and management by telemedicine and the availability of in person appointments. The patient expressed understanding and agreed to proceed.  History of Present Illness: William Vincent is a 54 y.o. male who would like to discuss his low back pain and recent L-spine MRI results from 07/18/19.  Patient notes that he has continued back and leg pain.  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  .   Assessment and Plan: 54 y.o. male with back and leg pain.    Spent time today discussing his MRI report.  Some of his back pain or the majority of back pain could certainly be due to the facet DJD. His leg pain is likely not due to radiculopathy.  Certainly some of his leg pain could be due to the mild arthritis seen on previous knee imaging.  Additionally it is possible that he has myalgias due to atorvastatin.  Recommend pausing atorvastatin for a few weeks to see if that makes any difference.  Additionally he could have some rheumatologic issue.  He had a work-up for this in 2019 that was negative.  Could consider repeating rheumatologic work-up in the future.  Additionally fibromyalgia is a possibility.  He is currently being treated with Lyrica and amitriptyline and venlafaxine all  of which may be helpful for fibromyalgia.  Not clear to me exactly why he is having the pain that he has.  Certainly could be multifactorial.  Plan to continue limited hydrocodone and refill Lyrica.  Recheck back in future as needed  PDMP reviewed during this encounter. No orders of the defined types were placed in this encounter.  Meds ordered this encounter  Medications  . HYDROcodone-acetaminophen (NORCO/VICODIN) 5-325 MG tablet    Sig: Take 1 tablet by mouth daily as needed.    Dispense:  30 tablet    Refill:  0    Chronic pain.  . pregabalin (LYRICA) 200 MG capsule    Sig: Take 1 capsule (200 mg total) by mouth 2 (two) times daily.    Dispense:  180 capsule    Refill:  1    Follow Up Instructions:    I discussed the assessment and treatment plan with the patient. The patient was provided an opportunity to ask questions and all were answered. The patient agreed with the plan and demonstrated an understanding of the instructions.   The patient was advised to call back or seek an in-person evaluation if the symptoms worsen or if the condition fails to improve as anticipated.  Time: 10 mins

## 2019-07-26 NOTE — Patient Instructions (Signed)
Thank you for coming in today. STOP atorvistatin for 3 weeks and see if the pain gets better. If it does can try to restart and if the pain returns we can be pretty sure that some of the muscle pain in your legs or back is coming from this medicine.   I think some of the back pain is coming from facet joint arthritis that we saw on the MRI.   It does not look like there is a significant pinched nerve in the back.

## 2019-07-27 DIAGNOSIS — M5442 Lumbago with sciatica, left side: Secondary | ICD-10-CM | POA: Diagnosis not present

## 2019-07-28 DIAGNOSIS — M5442 Lumbago with sciatica, left side: Secondary | ICD-10-CM | POA: Diagnosis not present

## 2019-07-29 DIAGNOSIS — M5442 Lumbago with sciatica, left side: Secondary | ICD-10-CM | POA: Diagnosis not present

## 2019-07-31 DIAGNOSIS — M5442 Lumbago with sciatica, left side: Secondary | ICD-10-CM | POA: Diagnosis not present

## 2019-08-01 DIAGNOSIS — M5442 Lumbago with sciatica, left side: Secondary | ICD-10-CM | POA: Diagnosis not present

## 2019-08-02 DIAGNOSIS — M5442 Lumbago with sciatica, left side: Secondary | ICD-10-CM | POA: Diagnosis not present

## 2019-08-03 DIAGNOSIS — M5442 Lumbago with sciatica, left side: Secondary | ICD-10-CM | POA: Diagnosis not present

## 2019-08-04 DIAGNOSIS — M5442 Lumbago with sciatica, left side: Secondary | ICD-10-CM | POA: Diagnosis not present

## 2019-08-04 DIAGNOSIS — H52223 Regular astigmatism, bilateral: Secondary | ICD-10-CM | POA: Diagnosis not present

## 2019-08-04 DIAGNOSIS — H524 Presbyopia: Secondary | ICD-10-CM | POA: Diagnosis not present

## 2019-08-05 DIAGNOSIS — M5442 Lumbago with sciatica, left side: Secondary | ICD-10-CM | POA: Diagnosis not present

## 2019-08-08 ENCOUNTER — Other Ambulatory Visit: Payer: Self-pay

## 2019-08-08 ENCOUNTER — Encounter: Payer: Medicaid Other | Admitting: Family Medicine

## 2019-08-08 ENCOUNTER — Telehealth: Payer: Self-pay | Admitting: Family Medicine

## 2019-08-08 NOTE — Telephone Encounter (Signed)
Patient called multiple times for virtual visit.  Panya spoke with patients daughter as well to let him know that we were trying to reach him and she stated she would pass on the message.   We have worked him in when he has been late on several occasions including last visit, but unfortunately could not accommodate this again today.  I wish him the best.

## 2019-08-08 NOTE — Progress Notes (Signed)
Attempted to contact patient for pre-visit screening..no answer. VM full

## 2019-08-08 NOTE — Telephone Encounter (Signed)
Patient stated that he was not told someone was going to call him ahead of time. That his appointment was at 3:20pm and that he shouldn't be penalized for the doctor calling early. He was upset and said that he's tried of getting treated this way and he was going to report [place] to [place]. States that he will NOT be calling up here any more and will find care else where. Did not want me to reschedule appointment at this time. I was getting ready to make patient a new appointment and patient yelled and said to take it off that he will NOT put up with this treatment any longer. Patient called at 3:53pm stating that he missed a call at 3:15pm and that it wasn't fair. I have taken Dr.Matthews off as the PCP for patient, per patients request.

## 2019-08-09 NOTE — Progress Notes (Signed)
This encounter was created in error - please disregard.

## 2019-08-11 ENCOUNTER — Ambulatory Visit (INDEPENDENT_AMBULATORY_CARE_PROVIDER_SITE_OTHER): Payer: Medicaid Other | Admitting: Family Medicine

## 2019-08-11 DIAGNOSIS — M2550 Pain in unspecified joint: Secondary | ICD-10-CM

## 2019-08-11 DIAGNOSIS — G250 Essential tremor: Secondary | ICD-10-CM

## 2019-08-11 DIAGNOSIS — M791 Myalgia, unspecified site: Secondary | ICD-10-CM | POA: Diagnosis not present

## 2019-08-11 MED ORDER — HYDROCODONE-ACETAMINOPHEN 5-325 MG PO TABS: 1.0000 | ORAL_TABLET | Freq: Every day | ORAL | 0 refills | Status: DC | PRN

## 2019-08-11 NOTE — Progress Notes (Signed)
Virtual Visit  I connected with      William Vincent  by a telemedicine application and verified that I am speaking with the correct person using two identifiers.   I discussed the limitations of evaluation and management by telemedicine and the availability of in person appointments. The patient expressed understanding and agreed to proceed.  History of Present Illness: William Vincent is a 54 y.o. male who would like to discuss pain and tremor  William Vincent notes myalgias and polyarthralgia.  He notes muscle pain and multiple different joint pain including his knees and hands.  At the last visit we tried stopping the atorvastatin which did not help at all.  He notes continued pain in his back legs and hands.  These are managed with Lyrica hydrocodone and NSAIDs.  Additionally takes methocarbamol as well.  He had a rheumatologic work-up in 2019 that was negative.  He had recent lumbar spine MRI that showed pars defect but no other significant obvious abnormalities.  Additionally has had x-rays of his legs knees somewhat recently that were also not very diagnostic.  Additionally he notes a tremor is quite problematic.  This is been managed with Provigil and most recently.  He notes this helps some but not sufficiently to control his pain.  He has never had evaluation with neurology.  Additionally he notes that he needs a PCP.  I was his previous PCP a trade Dr. Ashley Royalty and was not happy.  He wants a recommendation for a new PCP if possible.  He notes his blood pressures not very controlled.    Observations/Objective:  Exam: Normal Speech.     Assessment and Plan: 54 y.o. male with  Polyarthralgia and myalgia.  So far unclear fundamental cause.  Plan to repeat rheumatologic work-up in near future.  May benefit from rheumatology evaluation in the future.  Tremor: Likely essential tremor but not fully clear.  Primidone only marginally helpful.  Refer neurology.  PCP: I was William Vincent's prior primary  care provider.  He is having trouble getting a good rapport with his new PCP I recommended and wants a recommendation for a new primary care provider.  Advised that this is going to be a bit challenging with Medicaid however will do some research to try to recommend a new primary care provider.  Also recommended that he do independent research as well.  PDMP reviewed during this encounter. Orders Placed This Encounter  Procedures  . CBC    Standing Status:   Future    Standing Expiration Date:   08/10/2020  . Sedimentation rate    Standing Status:   Future    Standing Expiration Date:   08/10/2020  . Rheumatoid factor    Standing Status:   Future    Standing Expiration Date:   08/10/2020  . B. burgdorfi antibodies    Standing Status:   Future    Standing Expiration Date:   08/10/2020  . ANA    Standing Status:   Future    Standing Expiration Date:   08/10/2020  . CK    Standing Status:   Future    Standing Expiration Date:   08/10/2020  . Cyclic citrul peptide antibody, IgG    Standing Status:   Future    Standing Expiration Date:   08/10/2020  . Complement, total    Standing Status:   Future    Standing Expiration Date:   08/10/2020  . Uric acid    Standing Status:   Future  Standing Expiration Date:   08/10/2020  . Ambulatory referral to Neurology    Referral Priority:   Routine    Referral Type:   Consultation    Referral Reason:   Specialty Services Required    Requested Specialty:   Neurology    Number of Visits Requested:   1   Meds ordered this encounter  Medications  . HYDROcodone-acetaminophen (NORCO/VICODIN) 5-325 MG tablet    Sig: Take 1 tablet by mouth daily as needed. Fill on or after June 19th    Dispense:  30 tablet    Refill:  0    Chronic pain.    Follow Up Instructions:    I discussed the assessment and treatment plan with the patient. The patient was provided an opportunity to ask questions and all were answered. The patient agreed with the plan and demonstrated  an understanding of the instructions.   The patient was advised to call back or seek an in-person evaluation if the symptoms worsen or if the condition fails to improve as anticipated.  Time: 20    Historical information moved to improve visibility of documentation.  Past Medical History:  Diagnosis Date  . Gout   . Hypertension   . Kidney stones    Past Surgical History:  Procedure Laterality Date  . TONSILLECTOMY AND ADENOIDECTOMY     Social History   Tobacco Use  . Smoking status: Never Smoker  . Smokeless tobacco: Never Used  Substance Use Topics  . Alcohol use: No   family history is not on file.  Medications: Current Outpatient Medications  Medication Sig Dispense Refill  . AMBULATORY NON FORMULARY MEDICATION [place]. Use as needed for leg weakness and back pain.  Disp #1 Z74.09 1 each 0  . amitriptyline (ELAVIL) 25 MG tablet TAKE 1 TABLET (25 MG TOTAL) BY MOUTH AT BEDTIME AS NEEDED (PAIN). 90 tablet 1  . amLODipine (NORVASC) 10 MG tablet Take 1 tablet (10 mg total) by mouth daily. (Patient taking differently: Take 5 mg by mouth daily. ) 90 tablet 1  . atorvastatin (LIPITOR) 40 MG tablet Take 1 tablet (40 mg total) by mouth daily. For cholesterol 90 tablet 3  . buPROPion (WELLBUTRIN XL) 150 MG 24 hr tablet Take 1 tablet (150 mg total) by mouth daily. 90 tablet 1  . cetirizine (ZYRTEC) 10 MG tablet Take 1 tablet (10 mg total) by mouth daily. 30 tablet 11  . diclofenac sodium (VOLTAREN) 1 % GEL APPLY 4 GRAMS TOPICALLY 4 TIMES DAILY TO AFFECTED JOINT 100 g 11  . furosemide (LASIX) 20 MG tablet Take 1 tablet (20 mg total) by mouth daily. For leg swelling 90 tablet 1  . HYDROcodone-acetaminophen (NORCO/VICODIN) 5-325 MG tablet Take 1 tablet by mouth daily as needed. Fill on or after June 19th 30 tablet 0  . hydrOXYzine (ATARAX/VISTARIL) 25 MG tablet Take 0.5-1 tablets (12.5-25 mg total) by mouth 3 (three) times daily as needed. 45 tablet 0  . lidocaine (LIDODERM) 5  % Place 1 patch onto the skin daily. Remove & Discard patch within 12 hours or as directed by MD 30 patch 0  . methocarbamol (ROBAXIN) 500 MG tablet Take 1 tablet (500 mg total) by mouth 3 (three) times daily. 90 tablet 0  . metoprolol succinate (TOPROL-XL) 25 MG 24 hr tablet Take 1 tablet (25 mg total) by mouth daily. 90 tablet 1  . naproxen (NAPROSYN) 500 MG tablet Take 1 tablet (500 mg total) by mouth 2 (two) times daily with a  meal. 60 tablet 4  . pregabalin (LYRICA) 200 MG capsule Take 1 capsule (200 mg total) by mouth 2 (two) times daily. 180 capsule 1  . primidone (MYSOLINE) 250 MG tablet Take 2 tablets (500 mg total) by mouth at bedtime. For tremor 180 tablet 1  . sildenafil (VIAGRA) 100 MG tablet Take 0.5-1 tablets (50-100 mg total) by mouth daily as needed for erectile dysfunction. 30 tablet 11  . tizanidine (ZANAFLEX) 2 MG capsule Take 1 capsule (2 mg total) by mouth 3 (three) times daily as needed for muscle spasms. 90 capsule 3  . triamcinolone cream (KENALOG) 0.1 % Apply 1 application topically 2 (two) times daily. 453.6 g 12  . venlafaxine XR (EFFEXOR-XR) 150 MG 24 hr capsule Take 1 capsule (150 mg total) by mouth daily with breakfast. 90 capsule 1   No current facility-administered medications for this visit.   Allergies  Allergen Reactions  . Lisinopril-Hydrochlorothiazide     sweats  . Flexeril [Cyclobenzaprine] Hives and Rash

## 2019-08-14 DIAGNOSIS — M5442 Lumbago with sciatica, left side: Secondary | ICD-10-CM | POA: Diagnosis not present

## 2019-08-16 ENCOUNTER — Encounter: Payer: Self-pay | Admitting: Neurology

## 2019-08-16 DIAGNOSIS — M5442 Lumbago with sciatica, left side: Secondary | ICD-10-CM | POA: Diagnosis not present

## 2019-08-17 DIAGNOSIS — M5442 Lumbago with sciatica, left side: Secondary | ICD-10-CM | POA: Diagnosis not present

## 2019-08-18 DIAGNOSIS — M5442 Lumbago with sciatica, left side: Secondary | ICD-10-CM | POA: Diagnosis not present

## 2019-08-22 ENCOUNTER — Ambulatory Visit: Payer: Medicaid Other

## 2019-08-22 ENCOUNTER — Other Ambulatory Visit: Payer: Medicaid Other

## 2019-08-22 ENCOUNTER — Ambulatory Visit: Payer: Medicaid Other | Admitting: Family Medicine

## 2019-08-23 DIAGNOSIS — M5442 Lumbago with sciatica, left side: Secondary | ICD-10-CM | POA: Diagnosis not present

## 2019-08-24 DIAGNOSIS — M5442 Lumbago with sciatica, left side: Secondary | ICD-10-CM | POA: Diagnosis not present

## 2019-08-25 DIAGNOSIS — M5442 Lumbago with sciatica, left side: Secondary | ICD-10-CM | POA: Diagnosis not present

## 2019-08-28 DIAGNOSIS — M5442 Lumbago with sciatica, left side: Secondary | ICD-10-CM | POA: Diagnosis not present

## 2019-08-29 DIAGNOSIS — M5442 Lumbago with sciatica, left side: Secondary | ICD-10-CM | POA: Diagnosis not present

## 2019-08-29 NOTE — Progress Notes (Deleted)
Assessment/Plan:   1.  Essential Tremor.  -This is evidenced by the symmetrical nature and longstanding hx of gradually getting worse.  We discussed nature and pathophysiology.  We discussed that this can continue to gradually get worse with time.  We discussed that some medications can worsen this, as can caffeine use.  We discussed medication therapy as well as surgical therapy.  Ultimately, the patient decided to ***.     Subjective:   William Vincent was seen in consultation in the movement disorder clinic at the request of Gregor Hams, MD.  The evaluation is for tremor. Outside records that were made available to me were reviewed. Tremor started approximately *** ago and involves the ***.  Tremor is most noticeable when ***.   There is *** family hx of tremor.    Affected by caffeine:  {yes no:314532} Affected by alcohol:  {yes no:314532} Affected by stress:  {yes no:314532} Affected by fatigue:  {yes no:314532} Spills soup if on spoon:  {yes no:314532} Spills glass of liquid if full:  {yes no:314532} Affects ADL's (tying shoes, brushing teeth, etc):  {yes no:314532}   Started on primidone in 05/2018 and quickly worked up to 250mg  q hs (over 3 weeks).  Currently on 500 qhs  Current/Previously tried tremor medications: ***primidone, 250 mg, 2 po q hs; been on gabapentin 600 mg tid for pain  Current medications that may exacerbate tremor:  ***  Outside reports reviewed: {Outside review:15817}.  Allergies  Allergen Reactions  . Lisinopril-Hydrochlorothiazide     sweats  . Flexeril [Cyclobenzaprine] Hives and Rash    Current Outpatient Medications  Medication Instructions  . AMBULATORY NON FORMULARY MEDICATION Rolling Walker. Use as needed for leg weakness and back pain. <BR>Disp #1<BR>Z74.09  . amitriptyline (ELAVIL) 25 mg, Oral, At bedtime PRN  . amLODipine (NORVASC) 10 mg, Oral, Daily  . atorvastatin (LIPITOR) 40 mg, Oral, Daily, For cholesterol  . buPROPion (WELLBUTRIN XL)  150 mg, Oral, Daily  . cetirizine (ZYRTEC) 10 mg, Oral, Daily  . diclofenac sodium (VOLTAREN) 1 % GEL APPLY 4 GRAMS TOPICALLY 4 TIMES DAILY TO AFFECTED JOINT  . furosemide (LASIX) 20 mg, Oral, Daily, For leg swelling  . HYDROcodone-acetaminophen (NORCO/VICODIN) 5-325 MG tablet 1 tablet, Oral, Daily PRN, Fill on or after June 19th  . hydrOXYzine (ATARAX/VISTARIL) 12.5-25 mg, Oral, 3 times daily PRN  . lidocaine (LIDODERM) 5 % 1 patch, Transdermal, Every 24 hours, Remove & Discard patch within 12 hours or as directed by MD  . methocarbamol (ROBAXIN) 500 mg, Oral, 3 times daily  . metoprolol succinate (TOPROL-XL) 25 mg, Oral, Daily  . naproxen (NAPROSYN) 500 mg, Oral, 2 times daily with meals  . pregabalin (LYRICA) 200 mg, Oral, 2 times daily  . primidone (MYSOLINE) 500 mg, Oral, Daily at bedtime, For tremor  . sildenafil (VIAGRA) 50-100 mg, Oral, Daily PRN  . tizanidine (ZANAFLEX) 2 mg, Oral, 3 times daily PRN  . triamcinolone cream (KENALOG) 0.1 % 1 application, Topical, 2 times daily  . venlafaxine XR (EFFEXOR-XR) 150 mg, Oral, Daily with breakfast     Objective:   VITALS:  There were no vitals filed for this visit. Gen:  Appears stated age and in NAD. HEENT:  Normocephalic, atraumatic. The mucous membranes are moist. The superficial temporal arteries are without ropiness or tenderness. Cardiovascular: Regular rate and rhythm. Lungs: Clear to auscultation bilaterally. Neck: There are no carotid bruits noted bilaterally.  NEUROLOGICAL:  Orientation:  The patient is alert and oriented x 3.   Cranial  nerves: There is good facial symmetry. Extraocular muscles are intact and visual fields are full to confrontational testing. Speech is fluent and clear. Soft palate rises symmetrically and there is no tongue deviation. Hearing is intact to conversational tone. Tone: Tone is good throughout. Sensation: Sensation is intact to light touch touch throughout (facial, trunk, extremities).  Vibration is intact at the bilateral big toe. There is no extinction with double simultaneous stimulation. There is no sensory dermatomal level identified. Coordination:  The patient has no dysdiadichokinesia or dysmetria. Motor: Strength is 5/5 in the bilateral upper and lower extremities.  Shoulder shrug is equal bilaterally.  There is no pronator drift.  There are no fasciculations noted. DTR's: Deep tendon reflexes are 2/4 at the bilateral biceps, triceps, brachioradialis, patella and achilles.  Plantar responses are downgoing bilaterally. Gait and Station: The patient is able to ambulate without difficulty. The patient is able to heel toe walk without any difficulty. The patient is able to ambulate in a tandem fashion. The patient is able to stand in the Romberg position.   MOVEMENT EXAM: Tremor:  There is *** tremor in the UE, noted most significantly with action.  The patient is *** able to draw Archimedes spirals without significant difficulty.  There is *** tremor at rest.  The patient is *** able to pour water from one glass to another without spilling it.  I have reviewed and interpreted the following labs independently   Chemistry      Component Value Date/Time   NA 140 05/13/2018 0943   K 5.1 05/13/2018 0943   CL 106 05/13/2018 0943   CO2 26 05/13/2018 0943   BUN 18 05/13/2018 0943   CREATININE 1.30 05/13/2018 0943      Component Value Date/Time   CALCIUM 9.3 05/13/2018 0943   ALKPHOS 62 05/13/2015 1425   AST 21 05/13/2018 0943   ALT 27 05/13/2018 0943   BILITOT 0.5 05/13/2018 0943      Lab Results  Component Value Date   WBC 6.9 05/13/2018   HGB 15.2 05/13/2018   HCT 44.3 05/13/2018   MCV 87.7 05/13/2018   PLT 314 05/13/2018   Lab Results  Component Value Date   TSH 2.55 05/13/2018      Total time spent on today's visit was ***60 minutes, including both face-to-face time and nonface-to-face time.  Time included that spent on review of records (prior notes  available to me/labs/imaging if pertinent), discussing treatment and goals, answering patient's questions and coordinating care.  CC:  Patient, No Pcp Per

## 2019-08-30 DIAGNOSIS — M5442 Lumbago with sciatica, left side: Secondary | ICD-10-CM | POA: Diagnosis not present

## 2019-08-31 ENCOUNTER — Ambulatory Visit: Payer: Medicaid Other | Admitting: Family Medicine

## 2019-08-31 ENCOUNTER — Other Ambulatory Visit: Payer: Medicaid Other

## 2019-08-31 ENCOUNTER — Ambulatory Visit: Payer: Medicaid Other | Admitting: Neurology

## 2019-08-31 DIAGNOSIS — M5442 Lumbago with sciatica, left side: Secondary | ICD-10-CM | POA: Diagnosis not present

## 2019-08-31 NOTE — Progress Notes (Deleted)
   I, [name], LAT, ATC, am serving as scribe for Dr. [name].  William Vincent is a 54 y.o. male who presents to [facility] today for f/u of low back pain.  He was last seen virtually by Dr. [name] on 08/11/19 w/ c/o myalgias including knees, hands and lower back.    He takes hydrocodone-acetaminophen, metocarbamol, naproxen, lyrica and zanaflex.  Since his last visit w/ Dr. [name], pt reports   Diagnostic imaging: L-spine MRI- 07/18/19; L-spine XR- 06/02/19  Pertinent review of systems: ***  Relevant historical information: ***   Exam:  There were no vitals taken for this visit. General: Well Developed, well nourished, and in no acute distress.   MSK: ***    Lab and Radiology Results No results found for this or any previous visit (from the past 72 hour(s)). No results found.     Assessment and Plan: 54 y.o. male with ***   PDMP not reviewed this encounter. No orders of the defined types were placed in this encounter.  No orders of the defined types were placed in this encounter.    Discussed warning signs or symptoms. Please see discharge instructions. Patient expresses understanding.   ***

## 2019-09-01 DIAGNOSIS — M5442 Lumbago with sciatica, left side: Secondary | ICD-10-CM | POA: Diagnosis not present

## 2019-09-02 DIAGNOSIS — M5442 Lumbago with sciatica, left side: Secondary | ICD-10-CM | POA: Diagnosis not present

## 2019-09-04 DIAGNOSIS — M5442 Lumbago with sciatica, left side: Secondary | ICD-10-CM | POA: Diagnosis not present

## 2019-09-05 DIAGNOSIS — M5442 Lumbago with sciatica, left side: Secondary | ICD-10-CM | POA: Diagnosis not present

## 2019-09-07 DIAGNOSIS — M5442 Lumbago with sciatica, left side: Secondary | ICD-10-CM | POA: Diagnosis not present

## 2019-09-08 DIAGNOSIS — M5442 Lumbago with sciatica, left side: Secondary | ICD-10-CM | POA: Diagnosis not present

## 2019-09-12 DIAGNOSIS — F418 Other specified anxiety disorders: Secondary | ICD-10-CM | POA: Diagnosis not present

## 2019-09-12 DIAGNOSIS — Z125 Encounter for screening for malignant neoplasm of prostate: Secondary | ICD-10-CM | POA: Diagnosis not present

## 2019-09-12 DIAGNOSIS — Z1211 Encounter for screening for malignant neoplasm of colon: Secondary | ICD-10-CM | POA: Diagnosis not present

## 2019-09-12 DIAGNOSIS — I10 Essential (primary) hypertension: Secondary | ICD-10-CM | POA: Diagnosis not present

## 2019-09-12 DIAGNOSIS — Z6841 Body Mass Index (BMI) 40.0 and over, adult: Secondary | ICD-10-CM | POA: Diagnosis not present

## 2019-09-12 DIAGNOSIS — H9192 Unspecified hearing loss, left ear: Secondary | ICD-10-CM | POA: Diagnosis not present

## 2019-09-12 DIAGNOSIS — M5441 Lumbago with sciatica, right side: Secondary | ICD-10-CM | POA: Diagnosis not present

## 2019-09-12 DIAGNOSIS — G8929 Other chronic pain: Secondary | ICD-10-CM | POA: Diagnosis not present

## 2019-09-12 DIAGNOSIS — M5442 Lumbago with sciatica, left side: Secondary | ICD-10-CM | POA: Diagnosis not present

## 2019-09-12 DIAGNOSIS — E559 Vitamin D deficiency, unspecified: Secondary | ICD-10-CM | POA: Diagnosis not present

## 2019-09-13 DIAGNOSIS — M5442 Lumbago with sciatica, left side: Secondary | ICD-10-CM | POA: Diagnosis not present

## 2019-09-15 DIAGNOSIS — M5442 Lumbago with sciatica, left side: Secondary | ICD-10-CM | POA: Diagnosis not present

## 2019-09-15 NOTE — Progress Notes (Signed)
Assessment/Plan:   1.  Tremor.  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  Already on high dose primidone 500 mg q hs.  Didn't take it last night so didn't effect exam today.  Didn't have a lot of tremor today but bothersome per pt.    -continue primidone  -He has been on the propranolol in the past and found it was not helpful.  Discussed that there is evidence that propranolol and primidone can work synergistically and perhaps he needs to take them together.  However, he is on low-dose metoprolol.  He decided that he would like to try the propranolol, and I will ask his PA if the metoprolol can be changed to propranolol.  If so, then 60 mg is likely reasonable (that is what he was on prior).  -We also discussed trihexyphenidyl, along with his risk, benefits, and side effects.  Discussed extensively anticholinergic risks.  He decided that he may try that in the future if the propranolol and primidone did not work together.  -Discussed topiramate, but discussed that this would increase risk for nephrolithiasis in the future, and he has had that in the past as well.  2.  Follow up is anticipated in the next 6 months, sooner should new neurologic issues arise.   Subjective:   William Vincent was seen in consultation in the movement disorder clinic at the request of Rodolph Bong, MD.  This patient is accompanied in the office by his spouse who supplements the history.The evaluation is for tremor. Outside records that were made available to me were reviewed. Tremor started approximately 4 years  ago and involves the bilateral UE.  Tremor is most noticeable when using the hands.   There is no family hx of tremor.    Affected by caffeine:   (doesn't drink any) Affected by alcohol: (doesn't drink any) Affected by stress:  Yes.   Affected by fatigue:  Yes.   Spills soup if on spoon:  Yes.   Spills glass of liquid if full:  Yes.    Started on primidone in 05/2018 and quickly worked up to 250mg  q hs (over 3 weeks).  Currently on  500 qhs - pt states that it helps him rest at night but doesn't think that it has helped tremor  Current/Previously tried tremor medications: primidone, 250 mg, 2 po q hs; been on gabapentin 600 mg tid for pain; states that he was on propranolol 60 mg daily and didn't help  Current medications that may exacerbate tremor:  n/a  Outside reports reviewed: lab reports, office notes and referral letter/letters.  Allergies  Allergen Reactions  . Cyclobenzaprine Hives and Rash  . Lisinopril     Makes pt sweat very bad  . Lisinopril-Hydrochlorothiazide     sweats    Current Outpatient Medications  Medication Instructions  . AMBULATORY NON FORMULARY MEDICATION Rolling Walker. Use as needed for leg weakness and back pain. Disp #1Z74.09  . amitriptyline (ELAVIL) 25 mg, Oral, At bedtime PRN  . amLODipine (NORVASC) 10 mg, Oral, Daily  . atorvastatin (LIPITOR) 40 mg, Oral, Daily, For cholesterol  . buPROPion (WELLBUTRIN XL) 150 mg, Oral, Daily  . cetirizine (ZYRTEC) 10 mg, Oral, Daily  . diclofenac sodium (VOLTAREN) 1 % GEL APPLY 4 GRAMS TOPICALLY 4 TIMES DAILY TO AFFECTED JOINT  . furosemide (LASIX) 20 mg, Oral, Daily, For leg swelling  . HYDROcodone-acetaminophen (NORCO/VICODIN) 5-325 MG tablet 1 tablet, Oral, Daily PRN  . hydrOXYzine (ATARAX/VISTARIL) 12.5-25 mg, Oral, 3 times daily PRN  . lidocaine (LIDODERM)  5 % 1 patch, Transdermal, Every 24 hours, Remove & Discard patch within 12 hours or as directed by MD  . methocarbamol (ROBAXIN) 500 mg, Oral, 3 times daily  . metoprolol succinate (TOPROL-XL) 25 mg, Oral, Daily  . naproxen (NAPROSYN) 500 mg, Oral, 2 times daily with meals  . pregabalin (LYRICA) 200 mg, Oral, 2 times daily  . primidone (MYSOLINE) 500 mg, Oral, Daily at bedtime, For tremor  . sildenafil (VIAGRA) 50-100 mg, Oral, Daily PRN  . tizanidine (ZANAFLEX) 2 mg, Oral, 3 times daily PRN  . triamcinolone cream (KENALOG) 0.1 % 1 application, Topical, 2 times daily  . venlafaxine  XR (EFFEXOR-XR) 150 mg, Oral, Daily with breakfast     Objective:   VITALS:   Vitals:   09/21/19 0905  BP: 119/84  Pulse: 82  SpO2: 97%  Weight: 296 lb (134.3 kg)  Height: 5\' 10"  (1.778 m)   Gen:  Appears stated age and in NAD. HEENT:  Normocephalic, atraumatic. The mucous membranes are moist. The superficial temporal arteries are without ropiness or tenderness. Cardiovascular: Regular rate and rhythm. Lungs: Clear to auscultation bilaterally. Neck: There are no carotid bruits noted bilaterally.  NEUROLOGICAL:  Orientation:  The patient is alert and oriented x 3.   Cranial nerves: There is good facial symmetry. Extraocular muscles are intact with exception of strabisumus and visual fields are full to confrontational testing. Speech is fluent and clear. Soft palate rises symmetrically and there is no tongue deviation. Hearing is intact to conversational tone. Tone: Tone is good throughout. Sensation: Sensation is intact to light touch touch throughout (facial, trunk, extremities). Vibration is intact at the bilateral big ankle. There is no extinction with double simultaneous stimulation. There is no sensory dermatomal level identified. Coordination:  The patient has no dysdiadichokinesia or dysmetria. Motor: Strength is 5/5 in the bilateral upper and lower extremities (some give way weakness diffusely due to pain).  Shoulder shrug is equal bilaterally.  There is no pronator drift.  There are no fasciculations noted. DTR's: Deep tendon reflexes are 2/4 at the bilateral biceps, triceps, brachioradialis, patella and achilles.  Plantar responses are downgoing bilaterally. Gait and Station: The patient has antalgic gait.  Ambulates with cane  MOVEMENT EXAM: Tremor:  There is no rest tremor.  Min postural tremor.  Mild tremor with pouring water from one glass to another but doesn't spill it.  No tremor when given a weight   Chemistry      Component Value Date/Time   NA 140 05/13/2018  0943   K 5.1 05/13/2018 0943   CL 106 05/13/2018 0943   CO2 26 05/13/2018 0943   BUN 18 05/13/2018 0943   CREATININE 1.30 05/13/2018 0943      Component Value Date/Time   CALCIUM 9.3 05/13/2018 0943   ALKPHOS 62 05/13/2015 1425   AST 21 05/13/2018 0943   ALT 27 05/13/2018 0943   BILITOT 0.5 05/13/2018 0943      Lab Results  Component Value Date   WBC 6.9 05/13/2018   HGB 15.2 05/13/2018   HCT 44.3 05/13/2018   MCV 87.7 05/13/2018   PLT 314 05/13/2018   Lab Results  Component Value Date   TSH 2.55 05/13/2018      Total time spent on today's visit was 45 min, which 100% was in person face to face time.  CC:  Patient, No Pcp Per

## 2019-09-16 DIAGNOSIS — M5442 Lumbago with sciatica, left side: Secondary | ICD-10-CM | POA: Diagnosis not present

## 2019-09-18 DIAGNOSIS — M5442 Lumbago with sciatica, left side: Secondary | ICD-10-CM | POA: Diagnosis not present

## 2019-09-19 DIAGNOSIS — M5442 Lumbago with sciatica, left side: Secondary | ICD-10-CM | POA: Diagnosis not present

## 2019-09-20 DIAGNOSIS — M5442 Lumbago with sciatica, left side: Secondary | ICD-10-CM | POA: Diagnosis not present

## 2019-09-21 ENCOUNTER — Encounter: Payer: Self-pay | Admitting: Family Medicine

## 2019-09-21 ENCOUNTER — Ambulatory Visit (INDEPENDENT_AMBULATORY_CARE_PROVIDER_SITE_OTHER): Payer: Medicaid Other | Admitting: Family Medicine

## 2019-09-21 ENCOUNTER — Telehealth: Payer: Self-pay

## 2019-09-21 ENCOUNTER — Other Ambulatory Visit: Payer: Self-pay

## 2019-09-21 ENCOUNTER — Encounter: Payer: Self-pay | Admitting: Neurology

## 2019-09-21 ENCOUNTER — Ambulatory Visit (INDEPENDENT_AMBULATORY_CARE_PROVIDER_SITE_OTHER): Payer: Medicaid Other | Admitting: Neurology

## 2019-09-21 ENCOUNTER — Ambulatory Visit: Payer: Medicaid Other | Admitting: Family Medicine

## 2019-09-21 VITALS — BP 119/84 | HR 82 | Ht 70.0 in | Wt 296.0 lb

## 2019-09-21 DIAGNOSIS — M2550 Pain in unspecified joint: Secondary | ICD-10-CM

## 2019-09-21 DIAGNOSIS — M791 Myalgia, unspecified site: Secondary | ICD-10-CM | POA: Diagnosis not present

## 2019-09-21 DIAGNOSIS — G250 Essential tremor: Secondary | ICD-10-CM

## 2019-09-21 MED ORDER — HYDROCODONE-ACETAMINOPHEN 5-325 MG PO TABS: 1.0000 | ORAL_TABLET | Freq: Every day | ORAL | 0 refills | Status: DC | PRN

## 2019-09-21 MED ORDER — TIZANIDINE HCL 2 MG PO CAPS: 2.0000 mg | ORAL_CAPSULE | Freq: Three times a day (TID) | ORAL | 3 refills | Status: DC | PRN

## 2019-09-21 NOTE — Addendum Note (Signed)
Addended by: Jamian Andujo N on: 09/21/2019 08:03 AM   Modules accepted: Orders  

## 2019-09-21 NOTE — Telephone Encounter (Signed)
Patient contacted office stating someone called him. Advised patient that we have not called him and apologized for the inconvenience.

## 2019-09-21 NOTE — Addendum Note (Signed)
Addended by: Alyss Granato N on: 09/21/2019 08:03 AM   Modules accepted: Orders  

## 2019-09-21 NOTE — Telephone Encounter (Signed)
Copy of Dr Don Perking office note faxed to [facility].

## 2019-09-21 NOTE — Telephone Encounter (Signed)
[facility] is requesting records from patients visit today so that they will know why the medication is being changed.

## 2019-09-21 NOTE — Addendum Note (Signed)
Addended by: Zyia Kaneko N on: 09/21/2019 08:03 AM   Modules accepted: Orders  

## 2019-09-21 NOTE — Telephone Encounter (Signed)
Per Dr Tat contacted patients pcp and have them change the Metoprolol to Propanolol la 60mg  qd.  Left a detailed message for , PA-C, requesting she make the medicaton change. Requested a call back with any questions or concerns.

## 2019-09-21 NOTE — Addendum Note (Signed)
Addended by: Merrilyn Puma on: 09/21/2019 08:03 AM   Modules accepted: Orders

## 2019-09-21 NOTE — Patient Instructions (Signed)
Thank you for coming in today. Plan to continue current treatment.  Plan for limited hydrocodone.  Recheck in about 3 months.  Let me know about 1 week before you run out of hydrocodone that you need a refill and the location where you want it sent.

## 2019-09-21 NOTE — Patient Instructions (Signed)

## 2019-09-21 NOTE — Progress Notes (Signed)
I, William Vincent, LAT, ATC, am serving as scribe for Dr. [name].  William Vincent is a 54 y.o. male who presents to [facility] at [facility] today for f/u of low back pain.  He was last seen via video/phone visit by Dr. [name] on 08/11/19 for con't c/o myalgias and polyarthralgias.  He is taking Lyrica, Hydrocodone, NSAIDs and methocarbamol.  Since his last visit, pt reports he feels about the same. No improvement. Has a new PCP.   Main issue is chronic back pain.  Diagnostic imaging: L-spine MRI- 07/18/19; L-spine XR- 06/02/19  Pertinent review of systems: No fevers or chills  Relevant historical information: Hypertension chronic pain essential tremor   Exam:  BP 120/78 (BP Location: Right Arm, Patient Position: Sitting)   Pulse 77   Ht 5\' 10"  (1.778 m)   Wt 296 lb (134.3 kg)   SpO2 98%   BMI 42.47 kg/m  General: Well Developed, well nourished, and in no acute distress.   MSK: L-spine decreased motion.  Nontender midline.  Antalgic gait.    Lab and Radiology Results EXAM: MRI LUMBAR SPINE WITHOUT CONTRAST  TECHNIQUE: Multiplanar, multisequence MR imaging of the lumbar spine was performed. No intravenous contrast was administered.  COMPARISON:  11/01/2017  FINDINGS: Segmentation: There are five lumbar type vertebral bodies. The last full intervertebral disc space is labeled L5-S1. This correlates with the prior study.  Alignment:  Normal  Vertebrae: Normal marrow signal. No bone lesions or fractures. The facets are normally aligned. 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  Conus medullaris and cauda equina: Conus extends to the T12-L1 level. Conus and cauda equina appear normal.  Paraspinal and other soft tissues: No significant paraspinal or retroperitoneal findings.  Disc levels:  T12-L1: No significant findings.  L1-2: Mild facet disease but no disc protrusions, spinal or foraminal stenosis.  L2-3:  Mild facet disease but no disc protrusions, spinal or foraminal stenosis.  L3-4: Moderate facet disease but no disc protrusions, spinal or foraminal stenosis.  L4-5: Moderate facet disease but no disc protrusions, spinal or foraminal stenosis.  L5-S1: Slight bulging annulus but no spinal, lateral recess or foraminal stenosis. 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  IMPRESSION: 1. 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 2. No disc protrusions, spinal or foraminal stenosis. 3. Moderate multilevel facet disease.   Electronically Signed   By: 11/03/2017 M.D.   On: 07/19/2019 10:45 I, 09/18/2019, personally (independently) visualized and performed the interpretation of the images attached in this note.     Assessment and Plan: 54 y.o. male with chronic low back pain.  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  Unfortunately patient has not responded well to treatment over the last several years including trial of physical therapy and epidural steroid injections.  At this point plan to managed with ongoing chronic therapy including limited hydrocodone NSAIDs tizanidine heating pad etc.  Recheck in 3 months.  We will continue to refill hydrocodone at limited capacity.  Essential tremor: Patient has an appointment scheduled with neurology today.  This will be helpful as he has already tried and failed propranolol and now on pretty reasonable dose of primidone.   PDMP reviewed during this encounter. No orders of the defined types were placed in this encounter.  Meds ordered this encounter  Medications  . HYDROcodone-acetaminophen (NORCO/VICODIN) 5-325 MG tablet    Sig: Take 1 tablet by mouth daily as needed.  Dispense:  30 tablet    Refill:  0    Chronic pain.  . tizanidine (ZANAFLEX) 2 MG capsule    Sig: Take 1 capsule (2 mg total) by mouth 3 (three) times daily as needed for muscle spasms.    Dispense:  90  capsule    Refill:  3     Discussed warning signs or symptoms. Please see discharge instructions. Patient expresses understanding.   The above documentation has been reviewed and is accurate and complete [name], M.D.

## 2019-09-22 DIAGNOSIS — R3911 Hesitancy of micturition: Secondary | ICD-10-CM | POA: Diagnosis not present

## 2019-09-22 DIAGNOSIS — E559 Vitamin D deficiency, unspecified: Secondary | ICD-10-CM | POA: Diagnosis not present

## 2019-09-22 DIAGNOSIS — R350 Frequency of micturition: Secondary | ICD-10-CM | POA: Diagnosis not present

## 2019-09-22 DIAGNOSIS — E782 Mixed hyperlipidemia: Secondary | ICD-10-CM | POA: Diagnosis not present

## 2019-09-22 DIAGNOSIS — M5442 Lumbago with sciatica, left side: Secondary | ICD-10-CM | POA: Diagnosis not present

## 2019-09-22 DIAGNOSIS — E538 Deficiency of other specified B group vitamins: Secondary | ICD-10-CM | POA: Diagnosis not present

## 2019-09-23 DIAGNOSIS — M5442 Lumbago with sciatica, left side: Secondary | ICD-10-CM | POA: Diagnosis not present

## 2019-09-23 LAB — CBC
HCT: 46.9 % (ref 38.5–50.0)
Hemoglobin: 15.4 g/dL (ref 13.2–17.1)
MCH: 29.8 pg (ref 27.0–33.0)
MCHC: 32.8 g/dL (ref 32.0–36.0)
MCV: 90.9 fL (ref 80.0–100.0)
MPV: 10.6 fL (ref 7.5–12.5)
Platelets: 320 10*3/uL (ref 140–400)
RBC: 5.16 10*6/uL (ref 4.20–5.80)
RDW: 13.8 % (ref 11.0–15.0)
WBC: 7.1 10*3/uL (ref 3.8–10.8)

## 2019-09-23 LAB — ANA: Anti Nuclear Antibody (ANA): NEGATIVE

## 2019-09-23 LAB — URIC ACID: Uric Acid, Serum: 6.1 mg/dL (ref 4.0–8.0)

## 2019-09-23 LAB — RHEUMATOID FACTOR: Rheumatoid fact SerPl-aCnc: <14 IU/mL (ref <14)

## 2019-09-23 LAB — SEDIMENTATION RATE: Sed Rate: 14 mm/h (ref 0–20)

## 2019-09-23 LAB — B. BURGDORFI ANTIBODIES: B burgdorferi Ab IgG+IgM: <0.9 index

## 2019-09-23 LAB — CK: Total CK: 37 U/L — ABNORMAL LOW (ref 44–196)

## 2019-09-23 LAB — COMPLEMENT, TOTAL: Compl, Total (CH50): >60 U/mL — ABNORMAL HIGH (ref 31–60)

## 2019-09-23 LAB — CYCLIC CITRUL PEPTIDE ANTIBODY, IGG: Cyclic Citrullin Peptide Ab: <16 UNITS

## 2019-09-24 DIAGNOSIS — M5442 Lumbago with sciatica, left side: Secondary | ICD-10-CM | POA: Diagnosis not present

## 2019-09-25 DIAGNOSIS — M5442 Lumbago with sciatica, left side: Secondary | ICD-10-CM | POA: Diagnosis not present

## 2019-09-25 NOTE — Progress Notes (Signed)
Labs look pretty normal.

## 2019-09-27 DIAGNOSIS — M5442 Lumbago with sciatica, left side: Secondary | ICD-10-CM | POA: Diagnosis not present

## 2019-09-29 DIAGNOSIS — M5442 Lumbago with sciatica, left side: Secondary | ICD-10-CM | POA: Diagnosis not present

## 2019-09-29 DIAGNOSIS — E538 Deficiency of other specified B group vitamins: Secondary | ICD-10-CM | POA: Diagnosis not present

## 2019-10-04 DIAGNOSIS — M5442 Lumbago with sciatica, left side: Secondary | ICD-10-CM | POA: Diagnosis not present

## 2019-10-05 DIAGNOSIS — M5442 Lumbago with sciatica, left side: Secondary | ICD-10-CM | POA: Diagnosis not present

## 2019-10-06 DIAGNOSIS — E538 Deficiency of other specified B group vitamins: Secondary | ICD-10-CM | POA: Diagnosis not present

## 2019-10-06 DIAGNOSIS — M5442 Lumbago with sciatica, left side: Secondary | ICD-10-CM | POA: Diagnosis not present

## 2019-10-09 DIAGNOSIS — M5442 Lumbago with sciatica, left side: Secondary | ICD-10-CM | POA: Diagnosis not present

## 2019-10-09 DIAGNOSIS — R69 Illness, unspecified: Secondary | ICD-10-CM | POA: Diagnosis not present

## 2019-10-10 DIAGNOSIS — R69 Illness, unspecified: Secondary | ICD-10-CM | POA: Diagnosis not present

## 2019-10-10 DIAGNOSIS — M5442 Lumbago with sciatica, left side: Secondary | ICD-10-CM | POA: Diagnosis not present

## 2019-10-12 DIAGNOSIS — R69 Illness, unspecified: Secondary | ICD-10-CM | POA: Diagnosis not present

## 2019-10-12 DIAGNOSIS — M5442 Lumbago with sciatica, left side: Secondary | ICD-10-CM | POA: Diagnosis not present

## 2019-10-13 DIAGNOSIS — R69 Illness, unspecified: Secondary | ICD-10-CM | POA: Diagnosis not present

## 2019-10-13 DIAGNOSIS — M5442 Lumbago with sciatica, left side: Secondary | ICD-10-CM | POA: Diagnosis not present

## 2019-10-16 DIAGNOSIS — M5442 Lumbago with sciatica, left side: Secondary | ICD-10-CM | POA: Diagnosis not present

## 2019-10-16 DIAGNOSIS — R69 Illness, unspecified: Secondary | ICD-10-CM | POA: Diagnosis not present

## 2019-10-17 DIAGNOSIS — R69 Illness, unspecified: Secondary | ICD-10-CM | POA: Diagnosis not present

## 2019-10-17 DIAGNOSIS — I10 Essential (primary) hypertension: Secondary | ICD-10-CM | POA: Diagnosis not present

## 2019-10-17 DIAGNOSIS — M5442 Lumbago with sciatica, left side: Secondary | ICD-10-CM | POA: Diagnosis not present

## 2019-10-17 DIAGNOSIS — R350 Frequency of micturition: Secondary | ICD-10-CM | POA: Diagnosis not present

## 2019-10-18 DIAGNOSIS — M5442 Lumbago with sciatica, left side: Secondary | ICD-10-CM | POA: Diagnosis not present

## 2019-10-18 DIAGNOSIS — R69 Illness, unspecified: Secondary | ICD-10-CM | POA: Diagnosis not present

## 2019-10-19 DIAGNOSIS — M5442 Lumbago with sciatica, left side: Secondary | ICD-10-CM | POA: Diagnosis not present

## 2019-10-19 DIAGNOSIS — R69 Illness, unspecified: Secondary | ICD-10-CM | POA: Diagnosis not present

## 2019-10-20 DIAGNOSIS — M5442 Lumbago with sciatica, left side: Secondary | ICD-10-CM | POA: Diagnosis not present

## 2019-10-20 DIAGNOSIS — R69 Illness, unspecified: Secondary | ICD-10-CM | POA: Diagnosis not present

## 2019-10-23 DIAGNOSIS — M5442 Lumbago with sciatica, left side: Secondary | ICD-10-CM | POA: Diagnosis not present

## 2019-10-23 DIAGNOSIS — R69 Illness, unspecified: Secondary | ICD-10-CM | POA: Diagnosis not present

## 2019-10-24 ENCOUNTER — Telehealth: Payer: Self-pay | Admitting: Family Medicine

## 2019-10-24 DIAGNOSIS — R69 Illness, unspecified: Secondary | ICD-10-CM | POA: Diagnosis not present

## 2019-10-24 DIAGNOSIS — M5442 Lumbago with sciatica, left side: Secondary | ICD-10-CM | POA: Diagnosis not present

## 2019-10-24 NOTE — Telephone Encounter (Signed)
Pt would like hydrocodone-acetaminophen refilled to [facility] in [place].

## 2019-10-24 NOTE — Telephone Encounter (Signed)
Please ask the patient where he would like me to send the medication.

## 2019-10-24 NOTE — Telephone Encounter (Signed)
Patient called requesting a refill on his HYDROcodone-acetaminophen (NORCO/VICODIN) 5-325 MG tablet.

## 2019-10-25 DIAGNOSIS — R69 Illness, unspecified: Secondary | ICD-10-CM | POA: Diagnosis not present

## 2019-10-25 DIAGNOSIS — M5442 Lumbago with sciatica, left side: Secondary | ICD-10-CM | POA: Diagnosis not present

## 2019-10-25 MED ORDER — HYDROCODONE-ACETAMINOPHEN 5-325 MG PO TABS: 1.0000 | ORAL_TABLET | Freq: Every day | ORAL | 0 refills | Status: DC | PRN

## 2019-10-25 NOTE — Telephone Encounter (Signed)
Medication refilled

## 2019-10-26 DIAGNOSIS — M5442 Lumbago with sciatica, left side: Secondary | ICD-10-CM | POA: Diagnosis not present

## 2019-10-26 DIAGNOSIS — R69 Illness, unspecified: Secondary | ICD-10-CM | POA: Diagnosis not present

## 2019-10-27 DIAGNOSIS — R69 Illness, unspecified: Secondary | ICD-10-CM | POA: Diagnosis not present

## 2019-10-27 DIAGNOSIS — M5442 Lumbago with sciatica, left side: Secondary | ICD-10-CM | POA: Diagnosis not present

## 2019-10-28 DIAGNOSIS — M5442 Lumbago with sciatica, left side: Secondary | ICD-10-CM | POA: Diagnosis not present

## 2019-10-28 DIAGNOSIS — R69 Illness, unspecified: Secondary | ICD-10-CM | POA: Diagnosis not present

## 2019-10-29 DIAGNOSIS — R69 Illness, unspecified: Secondary | ICD-10-CM | POA: Diagnosis not present

## 2019-10-29 DIAGNOSIS — M5442 Lumbago with sciatica, left side: Secondary | ICD-10-CM | POA: Diagnosis not present

## 2019-10-30 DIAGNOSIS — R69 Illness, unspecified: Secondary | ICD-10-CM | POA: Diagnosis not present

## 2019-10-30 DIAGNOSIS — M5442 Lumbago with sciatica, left side: Secondary | ICD-10-CM | POA: Diagnosis not present

## 2019-10-31 ENCOUNTER — Ambulatory Visit: Payer: Medicaid Other | Admitting: Family Medicine

## 2019-11-02 DIAGNOSIS — R69 Illness, unspecified: Secondary | ICD-10-CM | POA: Diagnosis not present

## 2019-11-02 DIAGNOSIS — M5442 Lumbago with sciatica, left side: Secondary | ICD-10-CM | POA: Diagnosis not present

## 2019-11-03 DIAGNOSIS — R69 Illness, unspecified: Secondary | ICD-10-CM | POA: Diagnosis not present

## 2019-11-03 DIAGNOSIS — M5442 Lumbago with sciatica, left side: Secondary | ICD-10-CM | POA: Diagnosis not present

## 2019-11-05 DIAGNOSIS — R69 Illness, unspecified: Secondary | ICD-10-CM | POA: Diagnosis not present

## 2019-11-05 DIAGNOSIS — M5442 Lumbago with sciatica, left side: Secondary | ICD-10-CM | POA: Diagnosis not present

## 2019-11-06 DIAGNOSIS — R69 Illness, unspecified: Secondary | ICD-10-CM | POA: Diagnosis not present

## 2019-11-06 DIAGNOSIS — M5442 Lumbago with sciatica, left side: Secondary | ICD-10-CM | POA: Diagnosis not present

## 2019-11-07 DIAGNOSIS — E538 Deficiency of other specified B group vitamins: Secondary | ICD-10-CM | POA: Diagnosis not present

## 2019-11-07 DIAGNOSIS — R69 Illness, unspecified: Secondary | ICD-10-CM | POA: Diagnosis not present

## 2019-11-07 DIAGNOSIS — M5442 Lumbago with sciatica, left side: Secondary | ICD-10-CM | POA: Diagnosis not present

## 2019-11-09 DIAGNOSIS — M5442 Lumbago with sciatica, left side: Secondary | ICD-10-CM | POA: Diagnosis not present

## 2019-11-09 DIAGNOSIS — R69 Illness, unspecified: Secondary | ICD-10-CM | POA: Diagnosis not present

## 2019-11-10 DIAGNOSIS — R69 Illness, unspecified: Secondary | ICD-10-CM | POA: Diagnosis not present

## 2019-11-10 DIAGNOSIS — M5442 Lumbago with sciatica, left side: Secondary | ICD-10-CM | POA: Diagnosis not present

## 2019-11-12 DIAGNOSIS — M5442 Lumbago with sciatica, left side: Secondary | ICD-10-CM | POA: Diagnosis not present

## 2019-11-12 DIAGNOSIS — R69 Illness, unspecified: Secondary | ICD-10-CM | POA: Diagnosis not present

## 2019-11-13 DIAGNOSIS — M5442 Lumbago with sciatica, left side: Secondary | ICD-10-CM | POA: Diagnosis not present

## 2019-11-13 DIAGNOSIS — R69 Illness, unspecified: Secondary | ICD-10-CM | POA: Diagnosis not present

## 2019-11-14 DIAGNOSIS — M5442 Lumbago with sciatica, left side: Secondary | ICD-10-CM | POA: Diagnosis not present

## 2019-11-14 DIAGNOSIS — R69 Illness, unspecified: Secondary | ICD-10-CM | POA: Diagnosis not present

## 2019-11-15 DIAGNOSIS — M5442 Lumbago with sciatica, left side: Secondary | ICD-10-CM | POA: Diagnosis not present

## 2019-11-15 DIAGNOSIS — R69 Illness, unspecified: Secondary | ICD-10-CM | POA: Diagnosis not present

## 2019-11-16 DIAGNOSIS — R69 Illness, unspecified: Secondary | ICD-10-CM | POA: Diagnosis not present

## 2019-11-16 DIAGNOSIS — M5442 Lumbago with sciatica, left side: Secondary | ICD-10-CM | POA: Diagnosis not present

## 2019-11-17 DIAGNOSIS — R69 Illness, unspecified: Secondary | ICD-10-CM | POA: Diagnosis not present

## 2019-11-17 DIAGNOSIS — M5442 Lumbago with sciatica, left side: Secondary | ICD-10-CM | POA: Diagnosis not present

## 2019-11-20 DIAGNOSIS — M5442 Lumbago with sciatica, left side: Secondary | ICD-10-CM | POA: Diagnosis not present

## 2019-11-20 DIAGNOSIS — R69 Illness, unspecified: Secondary | ICD-10-CM | POA: Diagnosis not present

## 2019-11-21 DIAGNOSIS — R69 Illness, unspecified: Secondary | ICD-10-CM | POA: Diagnosis not present

## 2019-11-21 DIAGNOSIS — M5442 Lumbago with sciatica, left side: Secondary | ICD-10-CM | POA: Diagnosis not present

## 2019-11-22 DIAGNOSIS — M5442 Lumbago with sciatica, left side: Secondary | ICD-10-CM | POA: Diagnosis not present

## 2019-11-22 DIAGNOSIS — R69 Illness, unspecified: Secondary | ICD-10-CM | POA: Diagnosis not present

## 2019-11-25 DIAGNOSIS — R69 Illness, unspecified: Secondary | ICD-10-CM | POA: Diagnosis not present

## 2019-11-25 DIAGNOSIS — M5442 Lumbago with sciatica, left side: Secondary | ICD-10-CM | POA: Diagnosis not present

## 2019-11-27 DIAGNOSIS — M5442 Lumbago with sciatica, left side: Secondary | ICD-10-CM | POA: Diagnosis not present

## 2019-11-27 DIAGNOSIS — R69 Illness, unspecified: Secondary | ICD-10-CM | POA: Diagnosis not present

## 2019-11-29 DIAGNOSIS — M5442 Lumbago with sciatica, left side: Secondary | ICD-10-CM | POA: Diagnosis not present

## 2019-11-29 DIAGNOSIS — R69 Illness, unspecified: Secondary | ICD-10-CM | POA: Diagnosis not present

## 2019-11-30 DIAGNOSIS — R69 Illness, unspecified: Secondary | ICD-10-CM | POA: Diagnosis not present

## 2019-11-30 DIAGNOSIS — M5442 Lumbago with sciatica, left side: Secondary | ICD-10-CM | POA: Diagnosis not present

## 2019-12-01 DIAGNOSIS — R69 Illness, unspecified: Secondary | ICD-10-CM | POA: Diagnosis not present

## 2019-12-01 DIAGNOSIS — M5442 Lumbago with sciatica, left side: Secondary | ICD-10-CM | POA: Diagnosis not present

## 2019-12-03 DIAGNOSIS — R69 Illness, unspecified: Secondary | ICD-10-CM | POA: Diagnosis not present

## 2019-12-03 DIAGNOSIS — M5442 Lumbago with sciatica, left side: Secondary | ICD-10-CM | POA: Diagnosis not present

## 2019-12-05 DIAGNOSIS — R69 Illness, unspecified: Secondary | ICD-10-CM | POA: Diagnosis not present

## 2019-12-05 DIAGNOSIS — M5442 Lumbago with sciatica, left side: Secondary | ICD-10-CM | POA: Diagnosis not present

## 2019-12-06 ENCOUNTER — Telehealth: Payer: Self-pay | Admitting: Family Medicine

## 2019-12-06 DIAGNOSIS — M5442 Lumbago with sciatica, left side: Secondary | ICD-10-CM | POA: Diagnosis not present

## 2019-12-06 DIAGNOSIS — R69 Illness, unspecified: Secondary | ICD-10-CM | POA: Diagnosis not present

## 2019-12-06 NOTE — Telephone Encounter (Signed)
Pt wife called, pt is out of pain meds. Per wife, pt in constant pain and pain meds/Lyrica not really helping. Pt sleeping a lot per wife. Would like refill of pain meds to [facility] in [place]. Pt has appt on 10/15, would like to discuss Pain Mgmt referral at that time.  Pt phone is OFF, please call wife's phone [phone]

## 2019-12-07 DIAGNOSIS — R69 Illness, unspecified: Secondary | ICD-10-CM | POA: Diagnosis not present

## 2019-12-07 DIAGNOSIS — M5442 Lumbago with sciatica, left side: Secondary | ICD-10-CM | POA: Diagnosis not present

## 2019-12-07 DIAGNOSIS — E538 Deficiency of other specified B group vitamins: Secondary | ICD-10-CM | POA: Diagnosis not present

## 2019-12-07 MED ORDER — HYDROCODONE-ACETAMINOPHEN 5-325 MG PO TABS: 1.0000 | ORAL_TABLET | Freq: Every day | ORAL | 0 refills | Status: DC | PRN

## 2019-12-07 NOTE — Telephone Encounter (Signed)
Had an E prescribing error.  Medication sent to [facility]

## 2019-12-07 NOTE — Telephone Encounter (Signed)
Patient called asking if this could be sent to the [facility] in [place] [phone] [place] [id number] [facility] [place], [place]

## 2019-12-07 NOTE — Telephone Encounter (Signed)
Medicine sent to pharmacy

## 2019-12-11 DIAGNOSIS — M5442 Lumbago with sciatica, left side: Secondary | ICD-10-CM | POA: Diagnosis not present

## 2019-12-11 DIAGNOSIS — R69 Illness, unspecified: Secondary | ICD-10-CM | POA: Diagnosis not present

## 2019-12-13 DIAGNOSIS — R69 Illness, unspecified: Secondary | ICD-10-CM | POA: Diagnosis not present

## 2019-12-13 DIAGNOSIS — M5442 Lumbago with sciatica, left side: Secondary | ICD-10-CM | POA: Diagnosis not present

## 2019-12-14 DIAGNOSIS — M5442 Lumbago with sciatica, left side: Secondary | ICD-10-CM | POA: Diagnosis not present

## 2019-12-14 DIAGNOSIS — R69 Illness, unspecified: Secondary | ICD-10-CM | POA: Diagnosis not present

## 2019-12-15 DIAGNOSIS — M5442 Lumbago with sciatica, left side: Secondary | ICD-10-CM | POA: Diagnosis not present

## 2019-12-15 DIAGNOSIS — R69 Illness, unspecified: Secondary | ICD-10-CM | POA: Diagnosis not present

## 2019-12-16 DIAGNOSIS — R69 Illness, unspecified: Secondary | ICD-10-CM | POA: Diagnosis not present

## 2019-12-16 DIAGNOSIS — M5442 Lumbago with sciatica, left side: Secondary | ICD-10-CM | POA: Diagnosis not present

## 2019-12-18 DIAGNOSIS — M5442 Lumbago with sciatica, left side: Secondary | ICD-10-CM | POA: Diagnosis not present

## 2019-12-18 DIAGNOSIS — R69 Illness, unspecified: Secondary | ICD-10-CM | POA: Diagnosis not present

## 2019-12-19 DIAGNOSIS — R69 Illness, unspecified: Secondary | ICD-10-CM | POA: Diagnosis not present

## 2019-12-19 DIAGNOSIS — M5442 Lumbago with sciatica, left side: Secondary | ICD-10-CM | POA: Diagnosis not present

## 2019-12-20 DIAGNOSIS — R69 Illness, unspecified: Secondary | ICD-10-CM | POA: Diagnosis not present

## 2019-12-20 DIAGNOSIS — M5442 Lumbago with sciatica, left side: Secondary | ICD-10-CM | POA: Diagnosis not present

## 2019-12-22 ENCOUNTER — Encounter: Payer: Self-pay | Admitting: *Deleted

## 2019-12-22 ENCOUNTER — Ambulatory Visit (INDEPENDENT_AMBULATORY_CARE_PROVIDER_SITE_OTHER): Payer: Medicaid Other | Admitting: Family Medicine

## 2019-12-22 ENCOUNTER — Other Ambulatory Visit: Payer: Self-pay

## 2019-12-22 ENCOUNTER — Encounter: Payer: Self-pay | Admitting: Family Medicine

## 2019-12-22 VITALS — BP 124/88 | HR 85 | Ht 70.0 in | Wt 299.0 lb

## 2019-12-22 DIAGNOSIS — M25561 Pain in right knee: Secondary | ICD-10-CM

## 2019-12-22 DIAGNOSIS — G8929 Other chronic pain: Secondary | ICD-10-CM | POA: Diagnosis not present

## 2019-12-22 DIAGNOSIS — M5442 Lumbago with sciatica, left side: Secondary | ICD-10-CM | POA: Diagnosis not present

## 2019-12-22 DIAGNOSIS — M25562 Pain in left knee: Secondary | ICD-10-CM | POA: Diagnosis not present

## 2019-12-22 DIAGNOSIS — M4306 Spondylolysis, lumbar region: Secondary | ICD-10-CM | POA: Diagnosis not present

## 2019-12-22 DIAGNOSIS — R69 Illness, unspecified: Secondary | ICD-10-CM | POA: Diagnosis not present

## 2019-12-22 DIAGNOSIS — M5441 Lumbago with sciatica, right side: Secondary | ICD-10-CM

## 2019-12-22 MED ORDER — AMITRIPTYLINE HCL 25 MG PO TABS: 25.0000 mg | ORAL_TABLET | Freq: Every evening | ORAL | 1 refills | Status: DC | PRN

## 2019-12-22 MED ORDER — PREGABALIN 200 MG PO CAPS: 200.0000 mg | ORAL_CAPSULE | Freq: Two times a day (BID) | ORAL | 2 refills | Status: DC

## 2019-12-22 MED ORDER — NAPROXEN 500 MG PO TABS: 500.0000 mg | ORAL_TABLET | Freq: Two times a day (BID) | ORAL | 4 refills | Status: DC

## 2019-12-22 MED ORDER — HYDROCODONE-ACETAMINOPHEN 5-325 MG PO TABS: 1.0000 | ORAL_TABLET | Freq: Every day | ORAL | 0 refills | Status: DC | PRN

## 2019-12-22 MED ORDER — TIZANIDINE HCL 2 MG PO CAPS: 2.0000 mg | ORAL_CAPSULE | Freq: Three times a day (TID) | ORAL | 3 refills | Status: DC | PRN

## 2019-12-22 NOTE — Patient Instructions (Signed)
Thank you for coming in today.  Continue current medicines.  Recheck with phone in 3 month.  Let me know sooner if you need something.  Can refill hydrocodone every month.

## 2019-12-22 NOTE — Progress Notes (Signed)
I, [name], LAT, ATC, am serving as scribe for Dr. [name].  William Vincent is a 54 y.o. male who presents to [facility] at [facility] today for f/u of LBP and polyarthralgia.  He was last seen by Dr. [name] on 09/21/19 and was prescribed Hydrocodone-acetaminophen and Tizanidine.  Since his last visit, pt reports con't pain in his low back and and B LE pain.  He needs to have several meds refilled.  He has difficulty walking using a cane to ambulate.  He is not able to walk more than a few 100 feet without stopping to rest.  He is not able to sit or stand for about longer than 30 minutes without having to change positioning.  He remains very uncomfortable.  Has had trials of physical therapy epidural steroid injections knee injections all with very little benefit.  Diagnostic testing: Lumbar MRI- 07/18/19; Lumbar XR- 06/02/19   Pertinent review of systems: No fevers or chills  Relevant historical information: Hypertension, tremor, pars defect lumbar spine, knee DJD   Exam:  BP 124/88 (BP Location: Right Arm, Patient Position: Sitting, Cuff Size: Large)   Pulse 85   Ht 5\' 10"  (1.778 m)   Wt 299 lb (135.6 kg)   SpO2 97%   BMI 42.90 kg/m  General: Well Developed, well nourished, and in no acute distress.   MSK: L-spine decreased motion antalgic gait using a cane to ambulate.  Knees bilaterally normal motion with crepitation.     Assessment and Plan: 54 y.o. male with chronic back leg and knee pain.  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  Knee pain thought to be due to DJD.  Failing conservative management.  At this point I am not optimistic about an intervention that will relieve him of his pain.  At this point we are effectively managing his pain with medications including Lyrica tizanidine amitriptyline hydrocodone naproxen.  We will keep the hydrocodone prescription dose quite low at one 5 mg pill per day.  He almost certainly  could use more but at higher levels, higher risk.  Recheck back in 3 months in person or via phone.   PDMP reviewed during this encounter. No orders of the defined types were placed in this encounter.  Meds ordered this encounter  Medications  . pregabalin (LYRICA) 200 MG capsule    Sig: Take 1 capsule (200 mg total) by mouth 2 (two) times daily.    Dispense:  180 capsule    Refill:  2  . tizanidine (ZANAFLEX) 2 MG capsule    Sig: Take 1 capsule (2 mg total) by mouth 3 (three) times daily as needed for muscle spasms.    Dispense:  90 capsule    Refill:  3  . amitriptyline (ELAVIL) 25 MG tablet    Sig: Take 1 tablet (25 mg total) by mouth at bedtime as needed (pain).    Dispense:  90 tablet    Refill:  1  . naproxen (NAPROSYN) 500 MG tablet    Sig: Take 1 tablet (500 mg total) by mouth 2 (two) times daily with a meal.    Dispense:  60 tablet    Refill:  4  . HYDROcodone-acetaminophen (NORCO/VICODIN) 5-325 MG tablet    Sig: Take 1 tablet by mouth daily as needed.    Dispense:  30 tablet    Refill:  0    Chronic pain. Fill starting Oct 29th  Discussed warning signs or symptoms. Please see discharge instructions. Patient expresses understanding.   The above documentation has been reviewed and is accurate and complete Lynne Leader, M.D.

## 2019-12-22 NOTE — Addendum Note (Signed)
Addended by: Rodolph Bong on: 12/22/2019 01:20 PM   Modules accepted: Orders

## 2019-12-25 ENCOUNTER — Telehealth: Payer: Self-pay | Admitting: Family Medicine

## 2019-12-25 DIAGNOSIS — M5442 Lumbago with sciatica, left side: Secondary | ICD-10-CM | POA: Diagnosis not present

## 2019-12-25 DIAGNOSIS — R69 Illness, unspecified: Secondary | ICD-10-CM | POA: Diagnosis not present

## 2019-12-25 NOTE — Telephone Encounter (Signed)
Patient called asking if the prescriptions from Friday could be resent, the pharmacy did not receive them (but he did ask if they could be sent to [facility] in [place] instead).  pregabalin (LYRICA) 200 MG capsule amitriptyline (ELAVIL) 25 MG tablet naproxen (NAPROSYN) 500 MG tablet tizanidine (ZANAFLEX) 2 MG capsule HYDROcodone-acetaminophen (NORCO/VICODIN) 5-325 MG tablet

## 2019-12-26 DIAGNOSIS — R69 Illness, unspecified: Secondary | ICD-10-CM | POA: Diagnosis not present

## 2019-12-26 DIAGNOSIS — M5442 Lumbago with sciatica, left side: Secondary | ICD-10-CM | POA: Diagnosis not present

## 2019-12-26 MED ORDER — TIZANIDINE HCL 2 MG PO CAPS: 2.0000 mg | ORAL_CAPSULE | Freq: Three times a day (TID) | ORAL | 3 refills | Status: DC | PRN

## 2019-12-26 MED ORDER — NAPROXEN 500 MG PO TABS: 500.0000 mg | ORAL_TABLET | Freq: Two times a day (BID) | ORAL | 4 refills | Status: DC

## 2019-12-26 MED ORDER — HYDROCODONE-ACETAMINOPHEN 5-325 MG PO TABS: 1.0000 | ORAL_TABLET | Freq: Every day | ORAL | 0 refills | Status: DC | PRN

## 2019-12-26 MED ORDER — AMITRIPTYLINE HCL 25 MG PO TABS: 25.0000 mg | ORAL_TABLET | Freq: Every evening | ORAL | 1 refills | Status: DC | PRN

## 2019-12-26 MED ORDER — PREGABALIN 200 MG PO CAPS: 200.0000 mg | ORAL_CAPSULE | Freq: Two times a day (BID) | ORAL | 2 refills | Status: DC

## 2019-12-26 NOTE — Telephone Encounter (Signed)
Medicine sent again to [facility] in [place]

## 2019-12-27 DIAGNOSIS — R69 Illness, unspecified: Secondary | ICD-10-CM | POA: Diagnosis not present

## 2019-12-27 DIAGNOSIS — M5442 Lumbago with sciatica, left side: Secondary | ICD-10-CM | POA: Diagnosis not present

## 2019-12-27 NOTE — Telephone Encounter (Signed)
Patient called stating that when he went to pick up his medications, they are needing prior authorization for Amitriptyline and Tizandine.

## 2019-12-27 NOTE — Telephone Encounter (Addendum)
Called patient pharmacy and amitriptyline does not need a PA, but the Tizanidine is requiring one because we sent in a 90 day supply. If we send in a 30 day supply no PA would be required.

## 2019-12-28 MED ORDER — TIZANIDINE HCL 2 MG PO CAPS: 2.0000 mg | ORAL_CAPSULE | Freq: Three times a day (TID) | ORAL | 3 refills | Status: DC | PRN

## 2019-12-28 NOTE — Telephone Encounter (Signed)
30 pills sent.  Hopeful this will not require PA.

## 2019-12-28 NOTE — Addendum Note (Signed)
Addended by: Rodolph Bong on: 12/28/2019 07:19 AM   Modules accepted: Orders

## 2019-12-29 DIAGNOSIS — M5442 Lumbago with sciatica, left side: Secondary | ICD-10-CM | POA: Diagnosis not present

## 2019-12-29 DIAGNOSIS — R69 Illness, unspecified: Secondary | ICD-10-CM | POA: Diagnosis not present

## 2020-01-02 DIAGNOSIS — R69 Illness, unspecified: Secondary | ICD-10-CM | POA: Diagnosis not present

## 2020-01-03 DIAGNOSIS — R69 Illness, unspecified: Secondary | ICD-10-CM | POA: Diagnosis not present

## 2020-01-04 DIAGNOSIS — R69 Illness, unspecified: Secondary | ICD-10-CM | POA: Diagnosis not present

## 2020-01-05 DIAGNOSIS — R69 Illness, unspecified: Secondary | ICD-10-CM | POA: Diagnosis not present

## 2020-01-08 DIAGNOSIS — R69 Illness, unspecified: Secondary | ICD-10-CM | POA: Diagnosis not present

## 2020-01-10 DIAGNOSIS — R69 Illness, unspecified: Secondary | ICD-10-CM | POA: Diagnosis not present

## 2020-01-10 DIAGNOSIS — E782 Mixed hyperlipidemia: Secondary | ICD-10-CM | POA: Diagnosis not present

## 2020-01-10 DIAGNOSIS — I10 Essential (primary) hypertension: Secondary | ICD-10-CM | POA: Diagnosis not present

## 2020-01-10 DIAGNOSIS — E538 Deficiency of other specified B group vitamins: Secondary | ICD-10-CM | POA: Diagnosis not present

## 2020-01-11 DIAGNOSIS — R69 Illness, unspecified: Secondary | ICD-10-CM | POA: Diagnosis not present

## 2020-01-12 DIAGNOSIS — R69 Illness, unspecified: Secondary | ICD-10-CM | POA: Diagnosis not present

## 2020-01-16 DIAGNOSIS — R69 Illness, unspecified: Secondary | ICD-10-CM | POA: Diagnosis not present

## 2020-01-17 DIAGNOSIS — R69 Illness, unspecified: Secondary | ICD-10-CM | POA: Diagnosis not present

## 2020-01-19 DIAGNOSIS — R69 Illness, unspecified: Secondary | ICD-10-CM | POA: Diagnosis not present

## 2020-01-22 DIAGNOSIS — R69 Illness, unspecified: Secondary | ICD-10-CM | POA: Diagnosis not present

## 2020-01-23 DIAGNOSIS — R69 Illness, unspecified: Secondary | ICD-10-CM | POA: Diagnosis not present

## 2020-01-25 DIAGNOSIS — R69 Illness, unspecified: Secondary | ICD-10-CM | POA: Diagnosis not present

## 2020-01-26 DIAGNOSIS — R69 Illness, unspecified: Secondary | ICD-10-CM | POA: Diagnosis not present

## 2020-01-29 DIAGNOSIS — R69 Illness, unspecified: Secondary | ICD-10-CM | POA: Diagnosis not present

## 2020-01-29 NOTE — Progress Notes (Signed)
[facility] was scheduled today for phone visit.  I called patient twice.  No answer.  Unable to leave message.  Happy to rearrange if needed in the future.

## 2020-01-30 ENCOUNTER — Ambulatory Visit: Payer: Medicaid Other | Admitting: Family Medicine

## 2020-01-30 ENCOUNTER — Other Ambulatory Visit: Payer: Self-pay

## 2020-01-30 DIAGNOSIS — M5442 Lumbago with sciatica, left side: Secondary | ICD-10-CM

## 2020-01-30 DIAGNOSIS — G8929 Other chronic pain: Secondary | ICD-10-CM

## 2020-01-30 DIAGNOSIS — R69 Illness, unspecified: Secondary | ICD-10-CM | POA: Diagnosis not present

## 2020-01-31 DIAGNOSIS — R69 Illness, unspecified: Secondary | ICD-10-CM | POA: Diagnosis not present

## 2020-02-02 DIAGNOSIS — R69 Illness, unspecified: Secondary | ICD-10-CM | POA: Diagnosis not present

## 2020-02-05 DIAGNOSIS — R69 Illness, unspecified: Secondary | ICD-10-CM | POA: Diagnosis not present

## 2020-02-06 DIAGNOSIS — E559 Vitamin D deficiency, unspecified: Secondary | ICD-10-CM | POA: Diagnosis not present

## 2020-02-06 DIAGNOSIS — R251 Tremor, unspecified: Secondary | ICD-10-CM | POA: Diagnosis not present

## 2020-02-06 DIAGNOSIS — Z6841 Body Mass Index (BMI) 40.0 and over, adult: Secondary | ICD-10-CM | POA: Diagnosis not present

## 2020-02-06 DIAGNOSIS — M25561 Pain in right knee: Secondary | ICD-10-CM | POA: Diagnosis not present

## 2020-02-06 DIAGNOSIS — J0100 Acute maxillary sinusitis, unspecified: Secondary | ICD-10-CM | POA: Diagnosis not present

## 2020-02-06 DIAGNOSIS — R748 Abnormal levels of other serum enzymes: Secondary | ICD-10-CM | POA: Diagnosis not present

## 2020-02-06 DIAGNOSIS — E782 Mixed hyperlipidemia: Secondary | ICD-10-CM | POA: Diagnosis not present

## 2020-02-06 DIAGNOSIS — M25562 Pain in left knee: Secondary | ICD-10-CM | POA: Diagnosis not present

## 2020-02-06 DIAGNOSIS — I10 Essential (primary) hypertension: Secondary | ICD-10-CM | POA: Diagnosis not present

## 2020-02-06 DIAGNOSIS — Z1211 Encounter for screening for malignant neoplasm of colon: Secondary | ICD-10-CM | POA: Diagnosis not present

## 2020-02-06 DIAGNOSIS — E538 Deficiency of other specified B group vitamins: Secondary | ICD-10-CM | POA: Diagnosis not present

## 2020-02-07 ENCOUNTER — Telehealth: Payer: Self-pay | Admitting: Family Medicine

## 2020-02-07 DIAGNOSIS — R69 Illness, unspecified: Secondary | ICD-10-CM | POA: Diagnosis not present

## 2020-02-07 NOTE — Telephone Encounter (Signed)
Pt called, he went to his PCP this week and had labs done. Per pt, liver levels are "up". PCP concerned this could be due to Oxy or Lyrica. She has taken him off of his cholesterol meds at this time, he rechecks on the 28th. Pt does not have any Oxy currently as he could not afford, but confirms he was taking at the time the labs were done, and is still taking the Lyrica.  Unsure how to proceed.

## 2020-02-08 DIAGNOSIS — R69 Illness, unspecified: Secondary | ICD-10-CM | POA: Diagnosis not present

## 2020-02-08 NOTE — Telephone Encounter (Signed)
Called and left detailed message on pt's VM w/ Dr. Zollie Pee recommendation to con't taking Lyrica but to stay off of the oxycodone until he re-checks his labs on the 28th.

## 2020-02-08 NOTE — Telephone Encounter (Signed)
Continue lyrica. Hold on Oxycodone. Recheck liver labs in the future off the cholesterol medicines.  Lyrica probably ok.

## 2020-02-09 DIAGNOSIS — R69 Illness, unspecified: Secondary | ICD-10-CM | POA: Diagnosis not present

## 2020-02-12 DIAGNOSIS — R69 Illness, unspecified: Secondary | ICD-10-CM | POA: Diagnosis not present

## 2020-02-13 DIAGNOSIS — R69 Illness, unspecified: Secondary | ICD-10-CM | POA: Diagnosis not present

## 2020-02-14 DIAGNOSIS — R69 Illness, unspecified: Secondary | ICD-10-CM | POA: Diagnosis not present

## 2020-02-15 DIAGNOSIS — R69 Illness, unspecified: Secondary | ICD-10-CM | POA: Diagnosis not present

## 2020-02-16 DIAGNOSIS — R69 Illness, unspecified: Secondary | ICD-10-CM | POA: Diagnosis not present

## 2020-02-19 DIAGNOSIS — R69 Illness, unspecified: Secondary | ICD-10-CM | POA: Diagnosis not present

## 2020-02-21 DIAGNOSIS — R69 Illness, unspecified: Secondary | ICD-10-CM | POA: Diagnosis not present

## 2020-02-22 DIAGNOSIS — R69 Illness, unspecified: Secondary | ICD-10-CM | POA: Diagnosis not present

## 2020-02-23 DIAGNOSIS — R69 Illness, unspecified: Secondary | ICD-10-CM | POA: Diagnosis not present

## 2020-02-26 DIAGNOSIS — R69 Illness, unspecified: Secondary | ICD-10-CM | POA: Diagnosis not present

## 2020-02-27 DIAGNOSIS — R69 Illness, unspecified: Secondary | ICD-10-CM | POA: Diagnosis not present

## 2020-02-28 DIAGNOSIS — R69 Illness, unspecified: Secondary | ICD-10-CM | POA: Diagnosis not present

## 2020-02-29 DIAGNOSIS — R162 Hepatomegaly with splenomegaly, not elsewhere classified: Secondary | ICD-10-CM | POA: Diagnosis not present

## 2020-02-29 DIAGNOSIS — R69 Illness, unspecified: Secondary | ICD-10-CM | POA: Diagnosis not present

## 2020-02-29 DIAGNOSIS — K760 Fatty (change of) liver, not elsewhere classified: Secondary | ICD-10-CM | POA: Diagnosis not present

## 2020-03-04 DIAGNOSIS — R69 Illness, unspecified: Secondary | ICD-10-CM | POA: Diagnosis not present

## 2020-03-05 DIAGNOSIS — E538 Deficiency of other specified B group vitamins: Secondary | ICD-10-CM | POA: Diagnosis not present

## 2020-03-05 DIAGNOSIS — R162 Hepatomegaly with splenomegaly, not elsewhere classified: Secondary | ICD-10-CM | POA: Diagnosis not present

## 2020-03-05 DIAGNOSIS — Z7689 Persons encountering health services in other specified circumstances: Secondary | ICD-10-CM | POA: Diagnosis not present

## 2020-03-05 DIAGNOSIS — Z1211 Encounter for screening for malignant neoplasm of colon: Secondary | ICD-10-CM | POA: Diagnosis not present

## 2020-03-05 DIAGNOSIS — Z23 Encounter for immunization: Secondary | ICD-10-CM | POA: Diagnosis not present

## 2020-03-06 DIAGNOSIS — R69 Illness, unspecified: Secondary | ICD-10-CM | POA: Diagnosis not present

## 2020-03-07 DIAGNOSIS — R69 Illness, unspecified: Secondary | ICD-10-CM | POA: Diagnosis not present

## 2020-03-08 DIAGNOSIS — R69 Illness, unspecified: Secondary | ICD-10-CM | POA: Diagnosis not present

## 2020-03-12 DIAGNOSIS — R69 Illness, unspecified: Secondary | ICD-10-CM | POA: Diagnosis not present

## 2020-03-13 DIAGNOSIS — R69 Illness, unspecified: Secondary | ICD-10-CM | POA: Diagnosis not present

## 2020-03-14 DIAGNOSIS — R69 Illness, unspecified: Secondary | ICD-10-CM | POA: Diagnosis not present

## 2020-03-15 DIAGNOSIS — R69 Illness, unspecified: Secondary | ICD-10-CM | POA: Diagnosis not present

## 2020-03-18 DIAGNOSIS — R69 Illness, unspecified: Secondary | ICD-10-CM | POA: Diagnosis not present

## 2020-03-19 DIAGNOSIS — R69 Illness, unspecified: Secondary | ICD-10-CM | POA: Diagnosis not present

## 2020-03-19 NOTE — Progress Notes (Deleted)
Assessment/Plan:    1.  Essential Tremor  ***  Subjective:   William Vincent was seen today in follow up for essential tremor.  My previous records were reviewed prior to todays visit.  When we saw the patient last visit, we ended up contacting his PA to request that his metoprolol be changed to propranolol LA, 60 mg daily (he had been on this in the past, but not in combination with primidone, and there is data to suggest that this works synergistically).  I never heard back from the referring office after they just asked me to send me a copy of my notes.  I do see that the patient saw primary care physician assistant the day after he saw me, and I did see that propranolol was on the updated list.  I did review records from primary care's office.  Patient has been following with primary care for elevated liver enzymes.  He had a negative hepatitis panel.  Ultrasound with mild hepatosplenomegaly, fatty liver and gallstones were present.  Current prescribed movement disorder medications: ***Primidone, 250 mg, 2 tablets at night (patient was on this when he came to see me initially)   PREVIOUS MEDICATIONS: {Parkinson's RX:18200}  ALLERGIES:   Allergies  Allergen Reactions  . Cyclobenzaprine Hives and Rash  . Lisinopril     Makes pt sweat very bad  . Lisinopril-Hydrochlorothiazide     sweats    CURRENT MEDICATIONS:  Outpatient Encounter Medications as of 03/26/2020  Medication Sig  . AMBULATORY NON FORMULARY MEDICATION Rolling Walker. Use as needed for leg weakness and back pain.  Disp #1 Z74.09  . amitriptyline (ELAVIL) 25 MG tablet Take 1 tablet (25 mg total) by mouth at bedtime as needed (pain).  Marland Kitchen amLODipine (NORVASC) 10 MG tablet Take 1 tablet (10 mg total) by mouth daily.  Marland Kitchen atorvastatin (LIPITOR) 40 MG tablet Take 1 tablet (40 mg total) by mouth daily. For cholesterol  . buPROPion (WELLBUTRIN XL) 150 MG 24 hr tablet Take 1 tablet (150 mg total) by mouth daily.  . cetirizine  (ZYRTEC) 10 MG tablet Take 1 tablet (10 mg total) by mouth daily.  . diclofenac sodium (VOLTAREN) 1 % GEL APPLY 4 GRAMS TOPICALLY 4 TIMES DAILY TO AFFECTED JOINT  . furosemide (LASIX) 20 MG tablet Take 1 tablet (20 mg total) by mouth daily. For leg swelling  . HYDROcodone-acetaminophen (NORCO/VICODIN) 5-325 MG tablet Take 1 tablet by mouth daily as needed.  . hydrOXYzine (ATARAX/VISTARIL) 25 MG tablet Take 0.5-1 tablets (12.5-25 mg total) by mouth 3 (three) times daily as needed.  . lidocaine (LIDODERM) 5 % Place 1 patch onto the skin daily. Remove & Discard patch within 12 hours or as directed by MD  . methocarbamol (ROBAXIN) 500 MG tablet Take 1 tablet (500 mg total) by mouth 3 (three) times daily.  . metoprolol succinate (TOPROL-XL) 25 MG 24 hr tablet Take 1 tablet (25 mg total) by mouth daily.  . naproxen (NAPROSYN) 500 MG tablet Take 1 tablet (500 mg total) by mouth 2 (two) times daily with a meal.  . pregabalin (LYRICA) 200 MG capsule Take 1 capsule (200 mg total) by mouth 2 (two) times daily.  . primidone (MYSOLINE) 250 MG tablet Take 2 tablets (500 mg total) by mouth at bedtime. For tremor  . sildenafil (VIAGRA) 100 MG tablet Take 0.5-1 tablets (50-100 mg total) by mouth daily as needed for erectile dysfunction.  . tizanidine (ZANAFLEX) 2 MG capsule Take 1 capsule (2 mg total) by mouth  3 (three) times daily as needed for muscle spasms.  Marland Kitchen triamcinolone cream (KENALOG) 0.1 % Apply 1 application topically 2 (two) times daily.  Marland Kitchen venlafaxine XR (EFFEXOR-XR) 150 MG 24 hr capsule Take 1 capsule (150 mg total) by mouth daily with breakfast.   No facility-administered encounter medications on file as of 03/26/2020.     Objective:    PHYSICAL EXAMINATION:    VITALS:  There were no vitals filed for this visit.  GEN:  The patient appears stated age and is in NAD. HEENT:  Normocephalic, atraumatic.  The mucous membranes are moist. The superficial temporal arteries are without ropiness or  tenderness. CV:  RRR Lungs:  CTAB Neck/HEME:  There are no carotid bruits bilaterally.  Neurological examination:  Orientation: The patient is alert and oriented x3. Cranial nerves: There is good facial symmetry. The speech is fluent and clear. Soft palate rises symmetrically and there is no tongue deviation. Hearing is intact to conversational tone. Sensation: Sensation is intact to light touch throughout Motor: Strength is at least antigravity x4.  Movement examination: Tone: There is normal tone in the UE/LE Abnormal movements: *** Coordination:  There is *** decremation with RAM's, *** Gait and Station: The patient has *** difficulty arising out of a deep-seated chair without the use of the hands. The patient's stride length is good I have reviewed and interpreted the following labs independently   Chemistry      Component Value Date/Time   NA 140 05/13/2018 0943   K 5.1 05/13/2018 0943   CL 106 05/13/2018 0943   CO2 26 05/13/2018 0943   BUN 18 05/13/2018 0943   CREATININE 1.30 05/13/2018 0943      Component Value Date/Time   CALCIUM 9.3 05/13/2018 0943   ALKPHOS 62 05/13/2015 1425   AST 21 05/13/2018 0943   ALT 27 05/13/2018 0943   BILITOT 0.5 05/13/2018 0943      Lab Results  Component Value Date   WBC 7.1 09/21/2019   HGB 15.4 09/21/2019   HCT 46.9 09/21/2019   MCV 90.9 09/21/2019   PLT 320 09/21/2019   Lab Results  Component Value Date   TSH 2.55 05/13/2018     Chemistry      Component Value Date/Time   NA 140 05/13/2018 0943   K 5.1 05/13/2018 0943   CL 106 05/13/2018 0943   CO2 26 05/13/2018 0943   BUN 18 05/13/2018 0943   CREATININE 1.30 05/13/2018 0943      Component Value Date/Time   CALCIUM 9.3 05/13/2018 0943   ALKPHOS 62 05/13/2015 1425   AST 21 05/13/2018 0943   ALT 27 05/13/2018 0943   BILITOT 0.5 05/13/2018 0943      Patient had lab work with primary care on March 05, 2020.  AST was 97, ALT 101.   Total time spent on today's  visit was ***30 minutes, including both face-to-face time and nonface-to-face time.  Time included that spent on review of records (prior notes available to me/labs/imaging if pertinent), discussing treatment and goals, answering patient's questions and coordinating care.  Cc:  Allen Kell, PA-C

## 2020-03-20 DIAGNOSIS — R69 Illness, unspecified: Secondary | ICD-10-CM | POA: Diagnosis not present

## 2020-03-21 DIAGNOSIS — R69 Illness, unspecified: Secondary | ICD-10-CM | POA: Diagnosis not present

## 2020-03-22 ENCOUNTER — Ambulatory Visit: Payer: Medicaid Other | Admitting: Family Medicine

## 2020-03-22 DIAGNOSIS — R69 Illness, unspecified: Secondary | ICD-10-CM | POA: Diagnosis not present

## 2020-03-25 DIAGNOSIS — R69 Illness, unspecified: Secondary | ICD-10-CM | POA: Diagnosis not present

## 2020-03-26 ENCOUNTER — Ambulatory Visit: Payer: Medicaid Other | Admitting: Family Medicine

## 2020-03-26 ENCOUNTER — Ambulatory Visit: Payer: Medicaid Other | Admitting: Neurology

## 2020-03-26 DIAGNOSIS — R69 Illness, unspecified: Secondary | ICD-10-CM | POA: Diagnosis not present

## 2020-03-27 DIAGNOSIS — R69 Illness, unspecified: Secondary | ICD-10-CM | POA: Diagnosis not present

## 2020-03-29 DIAGNOSIS — R69 Illness, unspecified: Secondary | ICD-10-CM | POA: Diagnosis not present

## 2020-04-01 DIAGNOSIS — R69 Illness, unspecified: Secondary | ICD-10-CM | POA: Diagnosis not present

## 2020-04-03 DIAGNOSIS — R69 Illness, unspecified: Secondary | ICD-10-CM | POA: Diagnosis not present

## 2020-04-04 DIAGNOSIS — K429 Umbilical hernia without obstruction or gangrene: Secondary | ICD-10-CM | POA: Diagnosis not present

## 2020-04-04 DIAGNOSIS — R69 Illness, unspecified: Secondary | ICD-10-CM | POA: Diagnosis not present

## 2020-04-04 DIAGNOSIS — K8020 Calculus of gallbladder without cholecystitis without obstruction: Secondary | ICD-10-CM | POA: Diagnosis not present

## 2020-04-04 DIAGNOSIS — K760 Fatty (change of) liver, not elsewhere classified: Secondary | ICD-10-CM | POA: Diagnosis not present

## 2020-04-08 DIAGNOSIS — R69 Illness, unspecified: Secondary | ICD-10-CM | POA: Diagnosis not present

## 2020-04-09 DIAGNOSIS — R162 Hepatomegaly with splenomegaly, not elsewhere classified: Secondary | ICD-10-CM | POA: Diagnosis not present

## 2020-04-09 DIAGNOSIS — Z7689 Persons encountering health services in other specified circumstances: Secondary | ICD-10-CM | POA: Diagnosis not present

## 2020-04-09 DIAGNOSIS — E538 Deficiency of other specified B group vitamins: Secondary | ICD-10-CM | POA: Diagnosis not present

## 2020-04-09 DIAGNOSIS — I10 Essential (primary) hypertension: Secondary | ICD-10-CM | POA: Diagnosis not present

## 2020-04-09 DIAGNOSIS — F418 Other specified anxiety disorders: Secondary | ICD-10-CM | POA: Diagnosis not present

## 2020-04-09 DIAGNOSIS — R748 Abnormal levels of other serum enzymes: Secondary | ICD-10-CM | POA: Diagnosis not present

## 2020-04-09 NOTE — Progress Notes (Deleted)
Assessment/Plan:    1.  Essential Tremor  ***  Subjective:   William Vincent was seen today in follow up for essential tremor.  My previous records were reviewed prior to todays visit.  When we saw the patient last visit, we ended up contacting his PA to request that his metoprolol be changed to propranolol LA, 60 mg daily (he had been on this in the past, but not in combination with primidone, and there is data to suggest that this works synergistically).  I never heard back from the referring office after they just asked me to send me a copy of my notes.  I do see that the patient saw primary care physician assistant the day after he saw me, and I did see that propranolol was on the updated list.  I did review records from primary care's office.  Patient has been following with primary care for elevated liver enzymes.  He had a negative hepatitis panel.  Ultrasound with mild hepatosplenomegaly, fatty liver and gallstones were present.  Current prescribed movement disorder medications: ***Primidone, 250 mg, 2 tablets at night (patient was on this when he came to see me initially)   PREVIOUS MEDICATIONS: {Parkinson's RX:18200}  ALLERGIES:   Allergies  Allergen Reactions  . Cyclobenzaprine Hives and Rash  . Lisinopril     Makes pt sweat very bad  . Lisinopril-Hydrochlorothiazide     sweats    CURRENT MEDICATIONS:  Outpatient Encounter Medications as of 04/11/2020  Medication Sig  . AMBULATORY NON FORMULARY MEDICATION Rolling Walker. Use as needed for leg weakness and back pain.  Disp #1 Z74.09  . amitriptyline (ELAVIL) 25 MG tablet Take 1 tablet (25 mg total) by mouth at bedtime as needed (pain).  Marland Kitchen amLODipine (NORVASC) 10 MG tablet Take 1 tablet (10 mg total) by mouth daily.  Marland Kitchen atorvastatin (LIPITOR) 40 MG tablet Take 1 tablet (40 mg total) by mouth daily. For cholesterol  . buPROPion (WELLBUTRIN XL) 150 MG 24 hr tablet Take 1 tablet (150 mg total) by mouth daily.  . cetirizine  (ZYRTEC) 10 MG tablet Take 1 tablet (10 mg total) by mouth daily.  . diclofenac sodium (VOLTAREN) 1 % GEL APPLY 4 GRAMS TOPICALLY 4 TIMES DAILY TO AFFECTED JOINT  . furosemide (LASIX) 20 MG tablet Take 1 tablet (20 mg total) by mouth daily. For leg swelling  . HYDROcodone-acetaminophen (NORCO/VICODIN) 5-325 MG tablet Take 1 tablet by mouth daily as needed.  . hydrOXYzine (ATARAX/VISTARIL) 25 MG tablet Take 0.5-1 tablets (12.5-25 mg total) by mouth 3 (three) times daily as needed.  . lidocaine (LIDODERM) 5 % Place 1 patch onto the skin daily. Remove & Discard patch within 12 hours or as directed by MD  . methocarbamol (ROBAXIN) 500 MG tablet Take 1 tablet (500 mg total) by mouth 3 (three) times daily.  . metoprolol succinate (TOPROL-XL) 25 MG 24 hr tablet Take 1 tablet (25 mg total) by mouth daily.  . naproxen (NAPROSYN) 500 MG tablet Take 1 tablet (500 mg total) by mouth 2 (two) times daily with a meal.  . pregabalin (LYRICA) 200 MG capsule Take 1 capsule (200 mg total) by mouth 2 (two) times daily.  . primidone (MYSOLINE) 250 MG tablet Take 2 tablets (500 mg total) by mouth at bedtime. For tremor  . sildenafil (VIAGRA) 100 MG tablet Take 0.5-1 tablets (50-100 mg total) by mouth daily as needed for erectile dysfunction.  . tizanidine (ZANAFLEX) 2 MG capsule Take 1 capsule (2 mg total) by mouth  3 (three) times daily as needed for muscle spasms.  Marland Kitchen triamcinolone cream (KENALOG) 0.1 % Apply 1 application topically 2 (two) times daily.  Marland Kitchen venlafaxine XR (EFFEXOR-XR) 150 MG 24 hr capsule Take 1 capsule (150 mg total) by mouth daily with breakfast.   No facility-administered encounter medications on file as of 04/11/2020.     Objective:    PHYSICAL EXAMINATION:    VITALS:  There were no vitals filed for this visit.  GEN:  The patient appears stated age and is in NAD. HEENT:  Normocephalic, atraumatic.  The mucous membranes are moist. The superficial temporal arteries are without ropiness or  tenderness. CV:  RRR Lungs:  CTAB Neck/HEME:  There are no carotid bruits bilaterally.  Neurological examination:  Orientation: The patient is alert and oriented x3. Cranial nerves: There is good facial symmetry. The speech is fluent and clear. Soft palate rises symmetrically and there is no tongue deviation. Hearing is intact to conversational tone. Sensation: Sensation is intact to light touch throughout Motor: Strength is at least antigravity x4.  Movement examination: Tone: There is normal tone in the UE/LE Abnormal movements: *** Coordination:  There is *** decremation with RAM's, *** Gait and Station: The patient has *** difficulty arising out of a deep-seated chair without the use of the hands. The patient's stride length is good I have reviewed and interpreted the following labs independently   Chemistry      Component Value Date/Time   NA 140 05/13/2018 0943   K 5.1 05/13/2018 0943   CL 106 05/13/2018 0943   CO2 26 05/13/2018 0943   BUN 18 05/13/2018 0943   CREATININE 1.30 05/13/2018 0943      Component Value Date/Time   CALCIUM 9.3 05/13/2018 0943   ALKPHOS 62 05/13/2015 1425   AST 21 05/13/2018 0943   ALT 27 05/13/2018 0943   BILITOT 0.5 05/13/2018 0943      Lab Results  Component Value Date   WBC 7.1 09/21/2019   HGB 15.4 09/21/2019   HCT 46.9 09/21/2019   MCV 90.9 09/21/2019   PLT 320 09/21/2019   Lab Results  Component Value Date   TSH 2.55 05/13/2018     Chemistry      Component Value Date/Time   NA 140 05/13/2018 0943   K 5.1 05/13/2018 0943   CL 106 05/13/2018 0943   CO2 26 05/13/2018 0943   BUN 18 05/13/2018 0943   CREATININE 1.30 05/13/2018 0943      Component Value Date/Time   CALCIUM 9.3 05/13/2018 0943   ALKPHOS 62 05/13/2015 1425   AST 21 05/13/2018 0943   ALT 27 05/13/2018 0943   BILITOT 0.5 05/13/2018 0943      Patient had lab work with primary care on March 05, 2020.  AST was 97, ALT 101.   Total time spent on today's  visit was ***30 minutes, including both face-to-face time and nonface-to-face time.  Time included that spent on review of records (prior notes available to me/labs/imaging if pertinent), discussing treatment and goals, answering patient's questions and coordinating care.  Cc:  Allen Kell, PA-C

## 2020-04-10 DIAGNOSIS — R69 Illness, unspecified: Secondary | ICD-10-CM | POA: Diagnosis not present

## 2020-04-10 NOTE — Progress Notes (Deleted)
   I, [name], LAT, ATC, am serving as scribe for Dr. [name].  William Vincent is a 55 y.o. male who presents to [facility] at [facility] today for f/u of chronic LBP and B LE pain.  He was last seen by Dr. [name] on 12/22/19 and reported con't pain despite multiple trials of conservative measures such as epidural steroid injections, knee injections and PT.  He was in need of medication refills at his last visit for hydrocodone-acetaminophen, Lyrica, amitriptyline,  Naproxen and Tizanidine.  Since his last visit, pt reports    Pertinent review of systems: ***  Relevant historical information: ***   Exam:  There were no vitals taken for this visit. General: Well Developed, well nourished, and in no acute distress.   MSK: ***    Lab and Radiology Results No results found for this or any previous visit (from the past 72 hour(s)). No results found.     Assessment and Plan: 55 y.o. male with ***   PDMP not reviewed this encounter. No orders of the defined types were placed in this encounter.  No orders of the defined types were placed in this encounter.    Discussed warning signs or symptoms. Please see discharge instructions. Patient expresses understanding.   ***

## 2020-04-11 ENCOUNTER — Ambulatory Visit: Payer: Medicaid Other | Admitting: Neurology

## 2020-04-11 ENCOUNTER — Ambulatory Visit: Payer: Medicaid Other | Admitting: Family Medicine

## 2020-04-11 DIAGNOSIS — R69 Illness, unspecified: Secondary | ICD-10-CM | POA: Diagnosis not present

## 2020-04-11 NOTE — Progress Notes (Deleted)
Assessment/Plan:    1.  Essential Tremor  ***  Subjective:   William Vincent was seen today in follow up for essential tremor.  My previous records were reviewed prior to todays visit.  When we saw the patient last visit, we ended up contacting his PA to request that his metoprolol be changed to propranolol LA, 60 mg daily (he had been on this in the past, but not in combination with primidone, and there is data to suggest that this works synergistically).  I never heard back from the referring office after they just asked me to send me a copy of my notes.  I do see that the patient saw primary care physician assistant the day after he saw me, and I did see that propranolol was on the updated list.  I did review records from primary care's office.  Patient has been following with primary care for elevated liver enzymes.  He had a negative hepatitis panel.  Ultrasound with mild hepatosplenomegaly, fatty liver and gallstones were present.  Current prescribed movement disorder medications: ***Primidone, 250 mg, 2 tablets at night (patient was on this when he came to see me initially)   PREVIOUS MEDICATIONS: {Parkinson's RX:18200}  ALLERGIES:   Allergies  Allergen Reactions  . Cyclobenzaprine Hives and Rash  . Lisinopril     Makes pt sweat very bad  . Lisinopril-Hydrochlorothiazide     sweats    CURRENT MEDICATIONS:  Outpatient Encounter Medications as of 04/23/2020  Medication Sig  . AMBULATORY NON FORMULARY MEDICATION Rolling Walker. Use as needed for leg weakness and back pain.  Disp #1 Z74.09  . amitriptyline (ELAVIL) 25 MG tablet Take 1 tablet (25 mg total) by mouth at bedtime as needed (pain).  Marland Kitchen amLODipine (NORVASC) 10 MG tablet Take 1 tablet (10 mg total) by mouth daily.  Marland Kitchen atorvastatin (LIPITOR) 40 MG tablet Take 1 tablet (40 mg total) by mouth daily. For cholesterol  . buPROPion (WELLBUTRIN XL) 150 MG 24 hr tablet Take 1 tablet (150 mg total) by mouth daily.  . cetirizine  (ZYRTEC) 10 MG tablet Take 1 tablet (10 mg total) by mouth daily.  . diclofenac sodium (VOLTAREN) 1 % GEL APPLY 4 GRAMS TOPICALLY 4 TIMES DAILY TO AFFECTED JOINT  . furosemide (LASIX) 20 MG tablet Take 1 tablet (20 mg total) by mouth daily. For leg swelling  . HYDROcodone-acetaminophen (NORCO/VICODIN) 5-325 MG tablet Take 1 tablet by mouth daily as needed.  . hydrOXYzine (ATARAX/VISTARIL) 25 MG tablet Take 0.5-1 tablets (12.5-25 mg total) by mouth 3 (three) times daily as needed.  . lidocaine (LIDODERM) 5 % Place 1 patch onto the skin daily. Remove & Discard patch within 12 hours or as directed by MD  . methocarbamol (ROBAXIN) 500 MG tablet Take 1 tablet (500 mg total) by mouth 3 (three) times daily.  . metoprolol succinate (TOPROL-XL) 25 MG 24 hr tablet Take 1 tablet (25 mg total) by mouth daily.  . naproxen (NAPROSYN) 500 MG tablet Take 1 tablet (500 mg total) by mouth 2 (two) times daily with a meal.  . pregabalin (LYRICA) 200 MG capsule Take 1 capsule (200 mg total) by mouth 2 (two) times daily.  . primidone (MYSOLINE) 250 MG tablet Take 2 tablets (500 mg total) by mouth at bedtime. For tremor  . sildenafil (VIAGRA) 100 MG tablet Take 0.5-1 tablets (50-100 mg total) by mouth daily as needed for erectile dysfunction.  . tizanidine (ZANAFLEX) 2 MG capsule Take 1 capsule (2 mg total) by mouth  3 (three) times daily as needed for muscle spasms.  Marland Kitchen triamcinolone cream (KENALOG) 0.1 % Apply 1 application topically 2 (two) times daily.  Marland Kitchen venlafaxine XR (EFFEXOR-XR) 150 MG 24 hr capsule Take 1 capsule (150 mg total) by mouth daily with breakfast.   No facility-administered encounter medications on file as of 04/23/2020.     Objective:    PHYSICAL EXAMINATION:    VITALS:  There were no vitals filed for this visit.  GEN:  The patient appears stated age and is in NAD. HEENT:  Normocephalic, atraumatic.  The mucous membranes are moist. The superficial temporal arteries are without ropiness or  tenderness. CV:  RRR Lungs:  CTAB Neck/HEME:  There are no carotid bruits bilaterally.  Neurological examination:  Orientation: The patient is alert and oriented x3. Cranial nerves: There is good facial symmetry. The speech is fluent and clear. Soft palate rises symmetrically and there is no tongue deviation. Hearing is intact to conversational tone. Sensation: Sensation is intact to light touch throughout Motor: Strength is at least antigravity x4.  Movement examination: Tone: There is normal tone in the UE/LE Abnormal movements: *** Coordination:  There is *** decremation with RAM's, *** Gait and Station: The patient has *** difficulty arising out of a deep-seated chair without the use of the hands. The patient's stride length is good I have reviewed and interpreted the following labs independently   Chemistry      Component Value Date/Time   NA 140 05/13/2018 0943   K 5.1 05/13/2018 0943   CL 106 05/13/2018 0943   CO2 26 05/13/2018 0943   BUN 18 05/13/2018 0943   CREATININE 1.30 05/13/2018 0943      Component Value Date/Time   CALCIUM 9.3 05/13/2018 0943   ALKPHOS 62 05/13/2015 1425   AST 21 05/13/2018 0943   ALT 27 05/13/2018 0943   BILITOT 0.5 05/13/2018 0943      Lab Results  Component Value Date   WBC 7.1 09/21/2019   HGB 15.4 09/21/2019   HCT 46.9 09/21/2019   MCV 90.9 09/21/2019   PLT 320 09/21/2019   Lab Results  Component Value Date   TSH 2.55 05/13/2018     Chemistry      Component Value Date/Time   NA 140 05/13/2018 0943   K 5.1 05/13/2018 0943   CL 106 05/13/2018 0943   CO2 26 05/13/2018 0943   BUN 18 05/13/2018 0943   CREATININE 1.30 05/13/2018 0943      Component Value Date/Time   CALCIUM 9.3 05/13/2018 0943   ALKPHOS 62 05/13/2015 1425   AST 21 05/13/2018 0943   ALT 27 05/13/2018 0943   BILITOT 0.5 05/13/2018 0943      Patient had lab work with primary care on March 05, 2020.  AST was 97, ALT 101.   Total time spent on today's  visit was ***30 minutes, including both face-to-face time and nonface-to-face time.  Time included that spent on review of records (prior notes available to me/labs/imaging if pertinent), discussing treatment and goals, answering patient's questions and coordinating care.  Cc:  Allen Kell, PA-C

## 2020-04-12 DIAGNOSIS — R69 Illness, unspecified: Secondary | ICD-10-CM | POA: Diagnosis not present

## 2020-04-16 DIAGNOSIS — R69 Illness, unspecified: Secondary | ICD-10-CM | POA: Diagnosis not present

## 2020-04-17 DIAGNOSIS — R69 Illness, unspecified: Secondary | ICD-10-CM | POA: Diagnosis not present

## 2020-04-18 DIAGNOSIS — R69 Illness, unspecified: Secondary | ICD-10-CM | POA: Diagnosis not present

## 2020-04-19 DIAGNOSIS — R69 Illness, unspecified: Secondary | ICD-10-CM | POA: Diagnosis not present

## 2020-04-23 ENCOUNTER — Ambulatory Visit: Payer: Medicaid Other | Admitting: Neurology

## 2020-04-23 ENCOUNTER — Ambulatory Visit: Payer: Medicaid Other | Admitting: Family Medicine

## 2020-04-23 DIAGNOSIS — R69 Illness, unspecified: Secondary | ICD-10-CM | POA: Diagnosis not present

## 2020-04-24 DIAGNOSIS — R69 Illness, unspecified: Secondary | ICD-10-CM | POA: Diagnosis not present

## 2020-04-25 DIAGNOSIS — R69 Illness, unspecified: Secondary | ICD-10-CM | POA: Diagnosis not present

## 2020-04-25 NOTE — Progress Notes (Signed)
I, [name], LAT, ATC, am serving as scribe for Dr. [name].  William Vincent is a 55 y.o. male who presents to [facility] today for f/u chronic bilat LBP w/ sciatica. Pt was last seen by Dr. [name] on 12/22/19 and was advised to continue medication; Lyrica, tizanidine, amitriptyline, hydrocodone, and naproxen. Today, pt reports that he has stopped taking atorvastatin hydrocodone due to increased liver enzymes per bloodwork and recent diagnostic testing incluinge CT w/ his PCP.  His PCP notes however that his liver enzymes did not improve off of these medicines and has been referred to gastroenterology.  He notes that he has not been taking supplemental Tylenol on top of the tiny dose of Tylenol with the hydrocodone.  He is taking Lyrica, Tizanidine, BP med and naproxen but has been taken off of amitriptyline and hydrocodone.  He fell off the edge of the bed 2 nights ago and landed on his knees w/ L knee pain >R.  Dx imaging: 07/18/19 L-spine MRI  06/02/19 L-spine XR   Pertinent review of systems: No fevers or chills  Relevant historical information: Hypertension, pars defect lumbar spine, transaminitis, chronic back pain   Exam:  BP 102/74 (BP Location: Right Arm, Patient Position: Sitting, Cuff Size: Large)   Pulse (!) 57   Ht 5\' 10"  (1.778 m)   Wt 293 lb 9.6 oz (133.2 kg)   SpO2 96%   BMI 42.13 kg/m  General: Well Developed, well nourished, and in no acute distress.   MSK:  L-spine normal-appearing Nontender midline. Decreased lumbar motion. Lower extremity strength is intact.  Left knee normal-appearing Normal motion. Nontender Intact strength.  Right knee normal-appearing Normal motion. Nontender. Intact strength.    Assessment and Plan: 55 y.o. male with chronic low back pain.  Thought to be due to muscle dysfunction and spasm and weakness secondary to pars defects and.  Patient has had good trials of conservative management  previously with little improvement.  Continue chronic management with Lyrica and very limited hydrocodone.  I do not believe the hydrocodone with Tylenol is a significant cause of transaminitis.  Reasonable to continue these medications for now.  Additionally we will continue the naproxen and tizanidine as well as these been helpful for his other causes of chronic pain.  He did fall and landed on his knees recently he does not have significant knee injury today.  No concern for any fracture./Watchful waiting.  Recheck in 3 months or sooner if needed.   PDMP reviewed during this encounter. No orders of the defined types were placed in this encounter.  Meds ordered this encounter  Medications  . HYDROcodone-acetaminophen (NORCO/VICODIN) 5-325 MG tablet    Sig: Take 1 tablet by mouth daily as needed.    Dispense:  30 tablet    Refill:  0    Chronic pain. Fill starting Oct 29th  . pregabalin (LYRICA) 200 MG capsule    Sig: Take 1 capsule (200 mg total) by mouth 2 (two) times daily.    Dispense:  180 capsule    Refill:  2  . naproxen (NAPROSYN) 500 MG tablet    Sig: Take 1 tablet (500 mg total) by mouth 2 (two) times daily with a meal.    Dispense:  60 tablet    Refill:  4  . tizanidine (ZANAFLEX) 2 MG capsule    Sig: Take 1 capsule (2 mg total) by mouth 3 (three) times daily as needed for muscle spasms.  Dispense:  270 capsule    Refill:  3     Discussed warning signs or symptoms. Please see discharge instructions. Patient expresses understanding.   The above documentation has been reviewed and is accurate and complete [name], M.D.

## 2020-04-26 ENCOUNTER — Ambulatory Visit: Payer: Self-pay

## 2020-04-26 ENCOUNTER — Encounter: Payer: Self-pay | Admitting: Family Medicine

## 2020-04-26 ENCOUNTER — Other Ambulatory Visit (HOSPITAL_COMMUNITY): Payer: Self-pay | Admitting: Family Medicine

## 2020-04-26 ENCOUNTER — Other Ambulatory Visit: Payer: Self-pay

## 2020-04-26 ENCOUNTER — Ambulatory Visit (INDEPENDENT_AMBULATORY_CARE_PROVIDER_SITE_OTHER): Payer: Medicaid Other | Admitting: Family Medicine

## 2020-04-26 VITALS — BP 102/74 | HR 57 | Ht 70.0 in | Wt 293.6 lb

## 2020-04-26 DIAGNOSIS — M545 Low back pain, unspecified: Secondary | ICD-10-CM | POA: Diagnosis not present

## 2020-04-26 DIAGNOSIS — G8929 Other chronic pain: Secondary | ICD-10-CM

## 2020-04-26 DIAGNOSIS — R69 Illness, unspecified: Secondary | ICD-10-CM | POA: Diagnosis not present

## 2020-04-26 DIAGNOSIS — M25561 Pain in right knee: Secondary | ICD-10-CM

## 2020-04-26 DIAGNOSIS — M25562 Pain in left knee: Secondary | ICD-10-CM | POA: Diagnosis not present

## 2020-04-26 MED ORDER — PREGABALIN 200 MG PO CAPS: 200.0000 mg | ORAL_CAPSULE | Freq: Two times a day (BID) | ORAL | 2 refills | Status: DC

## 2020-04-26 MED ORDER — NAPROXEN 500 MG PO TABS: 500.0000 mg | ORAL_TABLET | Freq: Two times a day (BID) | ORAL | 4 refills | Status: DC

## 2020-04-26 MED ORDER — HYDROCODONE-ACETAMINOPHEN 5-325 MG PO TABS: 1.0000 | ORAL_TABLET | Freq: Every day | ORAL | 0 refills | Status: DC | PRN

## 2020-04-26 MED ORDER — TIZANIDINE HCL 2 MG PO CAPS: 2.0000 mg | ORAL_CAPSULE | Freq: Three times a day (TID) | ORAL | 3 refills | Status: DC | PRN

## 2020-04-26 MED FILL — tiZANidine HCL 2 MG TABS: 2 | 30 days supply | Qty: 90 | Fill #0

## 2020-04-26 MED FILL — PREGABALIN 200 MG CAPS: 200 | 90 days supply | Qty: 180 | Fill #0

## 2020-04-26 MED FILL — NAPROXEN 500 MG TABS: 500 | 30 days supply | Qty: 60 | Fill #0

## 2020-04-26 NOTE — Patient Instructions (Signed)
Thank you for coming in today. Recheck as needed.    

## 2020-04-30 DIAGNOSIS — R69 Illness, unspecified: Secondary | ICD-10-CM | POA: Diagnosis not present

## 2020-05-01 DIAGNOSIS — R69 Illness, unspecified: Secondary | ICD-10-CM | POA: Diagnosis not present

## 2020-05-06 DIAGNOSIS — R69 Illness, unspecified: Secondary | ICD-10-CM | POA: Diagnosis not present

## 2020-05-07 DIAGNOSIS — R69 Illness, unspecified: Secondary | ICD-10-CM | POA: Diagnosis not present

## 2020-05-09 DIAGNOSIS — R69 Illness, unspecified: Secondary | ICD-10-CM | POA: Diagnosis not present

## 2020-05-10 DIAGNOSIS — R69 Illness, unspecified: Secondary | ICD-10-CM | POA: Diagnosis not present

## 2020-05-13 DIAGNOSIS — R69 Illness, unspecified: Secondary | ICD-10-CM | POA: Diagnosis not present

## 2020-05-13 NOTE — Progress Notes (Signed)
Assessment/Plan:    1.  Essential Tremor  -Continue primidone, 250 mg at bed (I thought patient was on 500 mg, but he states that he is not)  -Patient reports that he was changed to propranolol LA, 60 mg daily.  I do not see that on his list, but he reports that he was changed.  His list indicates he is still on metoprolol   2.  Hand paresthesias  -This is waking him up at night.  He is dropping objects in the day.  Wonder if this represents carpal tunnel.  We will schedule EMG. Subjective:   William Vincent was seen today in follow up for essential tremor.  My previous records were reviewed prior to todays visit.  When we saw the patient last visit, we ended up contacting his PA to request that his metoprolol be changed to propranolol LA, 60 mg daily (he had been on this in the past, but not in combination with primidone, and there is data to suggest that this works synergistically).  I never heard back from the referring office after they just asked me to send me a copy of my notes.  I do see that the patient saw primary care physician assistant the day after he saw me, and I did see that propranolol was on the updated list.  I did review records from primary care's office.  Patient has been following with primary care for elevated liver enzymes.  He had a negative hepatitis panel.  Ultrasound with mild hepatosplenomegaly, fatty liver and gallstones were present.  Pt reports that tremor is doing okay in the day but it awakens him at night.  It is not a shaking, but rather a numbness.  When asked details about this, he reports numbness in the hands.  All the fingers will be involved.  It involves both hands.  He drops objects in the day.  He feels that he cannot open things like milk.  He has trouble buttoning clothing because he cannot feel the fingertips.  Current prescribed movement disorder medications: Primidone, 250 mg, 2 tablets at night (patient was on this when he came to see me initially  but states only taking 1 at bed) Propranolol LA, 60 mg  ALLERGIES:   Allergies  Allergen Reactions  . Cyclobenzaprine Hives and Rash  . Lisinopril     Makes pt sweat very bad  . Lisinopril-Hydrochlorothiazide     sweats    CURRENT MEDICATIONS:  Outpatient Encounter Medications as of 05/14/2020  Medication Sig  . AMBULATORY NON FORMULARY MEDICATION Rolling Walker. Use as needed for leg weakness and back pain.  Disp #1 Z74.09  . amitriptyline (ELAVIL) 25 MG tablet Take 1 tablet (25 mg total) by mouth at bedtime as needed (pain).  Marland Kitchen amLODipine (NORVASC) 10 MG tablet Take 1 tablet (10 mg total) by mouth daily.  Marland Kitchen atorvastatin (LIPITOR) 40 MG tablet Take 1 tablet (40 mg total) by mouth daily. For cholesterol  . buPROPion (WELLBUTRIN XL) 150 MG 24 hr tablet Take 1 tablet (150 mg total) by mouth daily.  . cetirizine (ZYRTEC) 10 MG tablet Take 1 tablet (10 mg total) by mouth daily.  . diclofenac sodium (VOLTAREN) 1 % GEL APPLY 4 GRAMS TOPICALLY 4 TIMES DAILY TO AFFECTED JOINT  . furosemide (LASIX) 20 MG tablet Take 1 tablet (20 mg total) by mouth daily. For leg swelling  . HYDROcodone-acetaminophen (NORCO/VICODIN) 5-325 MG tablet Take 1 tablet by mouth daily as needed.  . hydrOXYzine (ATARAX/VISTARIL) 25  MG tablet Take 0.5-1 tablets (12.5-25 mg total) by mouth 3 (three) times daily as needed.  . methocarbamol (ROBAXIN) 500 MG tablet Take 1 tablet (500 mg total) by mouth 3 (three) times daily.  . metoprolol succinate (TOPROL-XL) 25 MG 24 hr tablet Take 1 tablet (25 mg total) by mouth daily.  . naproxen (NAPROSYN) 500 MG tablet Take 1 tablet (500 mg total) by mouth 2 (two) times daily with a meal.  . pregabalin (LYRICA) 200 MG capsule Take 1 capsule (200 mg total) by mouth 2 (two) times daily.  . sildenafil (VIAGRA) 100 MG tablet Take 0.5-1 tablets (50-100 mg total) by mouth daily as needed for erectile dysfunction.  . tizanidine (ZANAFLEX) 2 MG capsule Take 1 capsule (2 mg total) by mouth 3  (three) times daily as needed for muscle spasms.  Marland Kitchen triamcinolone cream (KENALOG) 0.1 % Apply 1 application topically 2 (two) times daily.  Marland Kitchen venlafaxine XR (EFFEXOR-XR) 150 MG 24 hr capsule Take 1 capsule (150 mg total) by mouth daily with breakfast.  . [DISCONTINUED] primidone (MYSOLINE) 250 MG tablet Take 2 tablets (500 mg total) by mouth at bedtime. For tremor  . primidone (MYSOLINE) 250 MG tablet Take 1 tablet (250 mg total) by mouth at bedtime. For tremor  . [DISCONTINUED] lidocaine (LIDODERM) 5 % Place 1 patch onto the skin daily. Remove & Discard patch within 12 hours or as directed by MD (Patient not taking: Reported on 05/14/2020)   No facility-administered encounter medications on file as of 05/14/2020.     Objective:    PHYSICAL EXAMINATION:    VITALS:   Vitals:   05/14/20 1507  BP: 116/86  Pulse: 87  SpO2: 98%  Weight: 296 lb (134.3 kg)  Height: 5\' 10"  (1.778 m)    GEN:  The patient appears stated age and is in NAD. HEENT:  Normocephalic, atraumatic.   Neurological examination:  Orientation: The patient is alert and oriented x3. Cranial nerves: There is good facial symmetry. The speech is fluent and clear. Soft palate rises symmetrically and there is no tongue deviation. Hearing is intact to conversational tone. Sensation: Sensation is intact to light touch throughout Motor: Strength is at least antigravity x4.  Mild decreased grip strength bilaterally.  No atrophy of the intrinsic muscles of the hands.  Movement examination: Tone: There is normal tone in the UE/LE Abnormal movements: No postural tremor.  Mild tremor with Archimedes spirals. Coordination:  There is no decremation with RAM's  I have reviewed and interpreted the following labs independently   Chemistry      Component Value Date/Time   NA 140 05/13/2018 0943   K 5.1 05/13/2018 0943   CL 106 05/13/2018 0943   CO2 26 05/13/2018 0943   BUN 18 05/13/2018 0943   CREATININE 1.30 05/13/2018 0943       Component Value Date/Time   CALCIUM 9.3 05/13/2018 0943   ALKPHOS 62 05/13/2015 1425   AST 21 05/13/2018 0943   ALT 27 05/13/2018 0943   BILITOT 0.5 05/13/2018 0943      Lab Results  Component Value Date   WBC 7.1 09/21/2019   HGB 15.4 09/21/2019   HCT 46.9 09/21/2019   MCV 90.9 09/21/2019   PLT 320 09/21/2019   Lab Results  Component Value Date   TSH 2.55 05/13/2018     Chemistry      Component Value Date/Time   NA 140 05/13/2018 0943   K 5.1 05/13/2018 0943   CL 106 05/13/2018 0943  CO2 26 05/13/2018 0943   BUN 18 05/13/2018 0943   CREATININE 1.30 05/13/2018 0943      Component Value Date/Time   CALCIUM 9.3 05/13/2018 0943   ALKPHOS 62 05/13/2015 1425   AST 21 05/13/2018 0943   ALT 27 05/13/2018 0943   BILITOT 0.5 05/13/2018 0943      Patient had lab work with primary care on March 05, 2020.  AST was 97, ALT 101.   Total time spent on today's visit was 20 minutes, including both face-to-face time and nonface-to-face time.  Time included that spent on review of records (prior notes available to me/labs/imaging if pertinent), discussing treatment and goals, answering patient's questions and coordinating care.  Cc:  Allen Kell, PA-C

## 2020-05-14 ENCOUNTER — Encounter: Payer: Self-pay | Admitting: Neurology

## 2020-05-14 ENCOUNTER — Ambulatory Visit (INDEPENDENT_AMBULATORY_CARE_PROVIDER_SITE_OTHER): Payer: Medicaid Other | Admitting: Neurology

## 2020-05-14 ENCOUNTER — Other Ambulatory Visit: Payer: Self-pay

## 2020-05-14 VITALS — BP 116/86 | HR 87 | Ht 70.0 in | Wt 296.0 lb

## 2020-05-14 DIAGNOSIS — R202 Paresthesia of skin: Secondary | ICD-10-CM

## 2020-05-14 DIAGNOSIS — G250 Essential tremor: Secondary | ICD-10-CM

## 2020-05-14 MED ORDER — PRIMIDONE 250 MG PO TABS: 250.0000 mg | ORAL_TABLET | Freq: Every day | ORAL | 1 refills | Status: DC

## 2020-05-14 MED FILL — PRIMIDONE 250 MG TABS: 250 | 90 days supply | Qty: 90 | Fill #0

## 2020-05-14 NOTE — Patient Instructions (Signed)
ELECTROMYOGRAM AND NERVE CONDUCTION STUDIES (EMG/NCS) INSTRUCTIONS  How to Prepare The neurologist conducting the EMG will need to know if you have certain medical conditions. Tell the neurologist and other EMG lab personnel if you: . Have a pacemaker or any other electrical medical device . Take blood-thinning medications . Have hemophilia, a blood-clotting disorder that causes prolonged bleeding Bathing Take a shower or bath shortly before your exam in order to remove oils from your skin. Don't apply lotions or creams before the exam.  What to Expect You'll likely be asked to change into a hospital gown for the procedure and lie down on an examination table. The following explanations can help you understand what will happen during the exam.  . Electrodes. The neurologist or a technician places surface electrodes at various locations on your skin depending on where you're experiencing symptoms. Or the neurologist may insert needle electrodes at different sites depending on your symptoms.  . Sensations. The electrodes will at times transmit a tiny electrical current that you may feel as a twinge or spasm. The needle electrode may cause discomfort or pain that usually ends shortly after the needle is removed. If you are concerned about discomfort or pain, you may want to talk to the neurologist about taking a short break during the exam.  . Instructions. During the needle EMG, the neurologist will assess whether there is any spontaneous electrical activity when the muscle is at rest - activity that isn't present in healthy muscle tissue - and the degree of activity when you slightly contract the muscle.  He or she will give you instructions on resting and contracting a muscle at appropriate times. Depending on what muscles and nerves the neurologist is examining, he or she may ask you to change positions during the exam.  After your EMG You may experience some temporary, minor bruising where the  needle electrode was inserted into your muscle. This bruising should fade within several days. If it persists, contact your primary care doctor.    

## 2020-05-15 DIAGNOSIS — R69 Illness, unspecified: Secondary | ICD-10-CM | POA: Diagnosis not present

## 2020-05-16 DIAGNOSIS — R69 Illness, unspecified: Secondary | ICD-10-CM | POA: Diagnosis not present

## 2020-05-17 DIAGNOSIS — R69 Illness, unspecified: Secondary | ICD-10-CM | POA: Diagnosis not present

## 2020-05-20 DIAGNOSIS — R69 Illness, unspecified: Secondary | ICD-10-CM | POA: Diagnosis not present

## 2020-05-21 DIAGNOSIS — R69 Illness, unspecified: Secondary | ICD-10-CM | POA: Diagnosis not present

## 2020-05-24 DIAGNOSIS — R69 Illness, unspecified: Secondary | ICD-10-CM | POA: Diagnosis not present

## 2020-05-27 ENCOUNTER — Other Ambulatory Visit: Payer: Self-pay

## 2020-05-27 DIAGNOSIS — R69 Illness, unspecified: Secondary | ICD-10-CM | POA: Diagnosis not present

## 2020-05-27 NOTE — Telephone Encounter (Signed)
Patient called stating Dr. [name] told him to call when he runs out of his hydrocodone. Patient ran out and would like refill sent to [facility] in [place].

## 2020-05-27 NOTE — Telephone Encounter (Signed)
Please advise 

## 2020-05-29 DIAGNOSIS — F419 Anxiety disorder, unspecified: Secondary | ICD-10-CM | POA: Diagnosis not present

## 2020-05-29 DIAGNOSIS — R69 Illness, unspecified: Secondary | ICD-10-CM | POA: Diagnosis not present

## 2020-05-29 DIAGNOSIS — F32A Depression, unspecified: Secondary | ICD-10-CM | POA: Diagnosis not present

## 2020-05-29 DIAGNOSIS — K7581 Nonalcoholic steatohepatitis (NASH): Secondary | ICD-10-CM | POA: Diagnosis not present

## 2020-05-29 DIAGNOSIS — R7989 Other specified abnormal findings of blood chemistry: Secondary | ICD-10-CM | POA: Diagnosis not present

## 2020-05-29 DIAGNOSIS — I10 Essential (primary) hypertension: Secondary | ICD-10-CM | POA: Diagnosis not present

## 2020-05-29 DIAGNOSIS — Z6841 Body Mass Index (BMI) 40.0 and over, adult: Secondary | ICD-10-CM | POA: Diagnosis not present

## 2020-05-29 MED ORDER — HYDROCODONE-ACETAMINOPHEN 5-325 MG PO TABS
1.0000 | ORAL_TABLET | Freq: Every day | ORAL | 0 refills | Status: DC | PRN
Start: 2020-05-29 — End: 2020-07-05

## 2020-05-30 DIAGNOSIS — R69 Illness, unspecified: Secondary | ICD-10-CM | POA: Diagnosis not present

## 2020-05-31 DIAGNOSIS — R69 Illness, unspecified: Secondary | ICD-10-CM | POA: Diagnosis not present

## 2020-06-03 DIAGNOSIS — R69 Illness, unspecified: Secondary | ICD-10-CM | POA: Diagnosis not present

## 2020-06-04 DIAGNOSIS — E538 Deficiency of other specified B group vitamins: Secondary | ICD-10-CM | POA: Diagnosis not present

## 2020-06-04 DIAGNOSIS — I10 Essential (primary) hypertension: Secondary | ICD-10-CM | POA: Diagnosis not present

## 2020-06-04 DIAGNOSIS — R69 Illness, unspecified: Secondary | ICD-10-CM | POA: Diagnosis not present

## 2020-06-04 DIAGNOSIS — E782 Mixed hyperlipidemia: Secondary | ICD-10-CM | POA: Diagnosis not present

## 2020-06-04 DIAGNOSIS — R3989 Other symptoms and signs involving the genitourinary system: Secondary | ICD-10-CM | POA: Diagnosis not present

## 2020-06-05 DIAGNOSIS — R69 Illness, unspecified: Secondary | ICD-10-CM | POA: Diagnosis not present

## 2020-06-06 DIAGNOSIS — R69 Illness, unspecified: Secondary | ICD-10-CM | POA: Diagnosis not present

## 2020-06-07 DIAGNOSIS — R69 Illness, unspecified: Secondary | ICD-10-CM | POA: Diagnosis not present

## 2020-06-10 DIAGNOSIS — R69 Illness, unspecified: Secondary | ICD-10-CM | POA: Diagnosis not present

## 2020-06-11 DIAGNOSIS — R69 Illness, unspecified: Secondary | ICD-10-CM | POA: Diagnosis not present

## 2020-06-12 DIAGNOSIS — R69 Illness, unspecified: Secondary | ICD-10-CM | POA: Diagnosis not present

## 2020-06-13 DIAGNOSIS — R69 Illness, unspecified: Secondary | ICD-10-CM | POA: Diagnosis not present

## 2020-06-14 DIAGNOSIS — R69 Illness, unspecified: Secondary | ICD-10-CM | POA: Diagnosis not present

## 2020-06-17 DIAGNOSIS — R69 Illness, unspecified: Secondary | ICD-10-CM | POA: Diagnosis not present

## 2020-06-18 DIAGNOSIS — R69 Illness, unspecified: Secondary | ICD-10-CM | POA: Diagnosis not present

## 2020-06-20 DIAGNOSIS — R69 Illness, unspecified: Secondary | ICD-10-CM | POA: Diagnosis not present

## 2020-06-21 DIAGNOSIS — R69 Illness, unspecified: Secondary | ICD-10-CM | POA: Diagnosis not present

## 2020-06-25 DIAGNOSIS — R69 Illness, unspecified: Secondary | ICD-10-CM | POA: Diagnosis not present

## 2020-06-26 DIAGNOSIS — R748 Abnormal levels of other serum enzymes: Secondary | ICD-10-CM | POA: Diagnosis not present

## 2020-06-26 DIAGNOSIS — Z7689 Persons encountering health services in other specified circumstances: Secondary | ICD-10-CM | POA: Diagnosis not present

## 2020-06-26 DIAGNOSIS — R251 Tremor, unspecified: Secondary | ICD-10-CM | POA: Diagnosis not present

## 2020-06-26 DIAGNOSIS — E782 Mixed hyperlipidemia: Secondary | ICD-10-CM | POA: Diagnosis not present

## 2020-06-26 DIAGNOSIS — R69 Illness, unspecified: Secondary | ICD-10-CM | POA: Diagnosis not present

## 2020-06-26 DIAGNOSIS — F418 Other specified anxiety disorders: Secondary | ICD-10-CM | POA: Diagnosis not present

## 2020-06-26 DIAGNOSIS — E538 Deficiency of other specified B group vitamins: Secondary | ICD-10-CM | POA: Diagnosis not present

## 2020-06-26 DIAGNOSIS — I10 Essential (primary) hypertension: Secondary | ICD-10-CM | POA: Diagnosis not present

## 2020-06-26 DIAGNOSIS — Z6841 Body Mass Index (BMI) 40.0 and over, adult: Secondary | ICD-10-CM | POA: Diagnosis not present

## 2020-06-27 DIAGNOSIS — R69 Illness, unspecified: Secondary | ICD-10-CM | POA: Diagnosis not present

## 2020-06-28 DIAGNOSIS — R69 Illness, unspecified: Secondary | ICD-10-CM | POA: Diagnosis not present

## 2020-07-01 DIAGNOSIS — R69 Illness, unspecified: Secondary | ICD-10-CM | POA: Diagnosis not present

## 2020-07-02 DIAGNOSIS — R69 Illness, unspecified: Secondary | ICD-10-CM | POA: Diagnosis not present

## 2020-07-03 DIAGNOSIS — R69 Illness, unspecified: Secondary | ICD-10-CM | POA: Diagnosis not present

## 2020-07-04 ENCOUNTER — Other Ambulatory Visit: Payer: Self-pay

## 2020-07-04 DIAGNOSIS — R69 Illness, unspecified: Secondary | ICD-10-CM | POA: Diagnosis not present

## 2020-07-04 DIAGNOSIS — R202 Paresthesia of skin: Secondary | ICD-10-CM

## 2020-07-05 ENCOUNTER — Telehealth: Payer: Self-pay | Admitting: Family Medicine

## 2020-07-05 DIAGNOSIS — R69 Illness, unspecified: Secondary | ICD-10-CM | POA: Diagnosis not present

## 2020-07-05 MED ORDER — HYDROCODONE-ACETAMINOPHEN 5-325 MG PO TABS: 1.0000 | ORAL_TABLET | Freq: Every day | ORAL | 0 refills | Status: DC | PRN

## 2020-07-05 NOTE — Telephone Encounter (Signed)
Patient is requesting a refill on his HYDROcodone-acetaminophen (NORCO/VICODIN) 5-325 MG tablet to be sent to [id number] - [place], [place] - [address]

## 2020-07-05 NOTE — Telephone Encounter (Signed)
Hydrocodone refilled.  

## 2020-07-08 ENCOUNTER — Telehealth: Payer: Self-pay | Admitting: Family Medicine

## 2020-07-08 DIAGNOSIS — R251 Tremor, unspecified: Secondary | ICD-10-CM | POA: Diagnosis not present

## 2020-07-08 NOTE — Telephone Encounter (Signed)
Hydrocodone needs a PA. Can you help?

## 2020-07-08 NOTE — Telephone Encounter (Signed)
Patient called asking for a refill on celecoxib (CELEBREX) 200 MG capsule to be sent to [facility] in [place]. This was last filled 12/2018.

## 2020-07-09 ENCOUNTER — Encounter: Payer: Medicaid Other | Admitting: Neurology

## 2020-07-09 ENCOUNTER — Telehealth: Payer: Self-pay | Admitting: Family Medicine

## 2020-07-09 MED ORDER — CELECOXIB 200 MG PO CAPS: ORAL_CAPSULE | ORAL | 2 refills | Status: DC

## 2020-07-09 NOTE — Telephone Encounter (Signed)
Celebrex prescribed.  Do not take with naproxen or ibuprofen.

## 2020-07-09 NOTE — Telephone Encounter (Signed)
Called pt and advised his wife [name] that he is not to be taking Celebrex w/ naproxen or IBU and she verbalizes that he is not taking celebrex in combo w/ either of those medications.

## 2020-07-09 NOTE — Telephone Encounter (Signed)
Patient called back stating they are going to get medication from a different pharmacy and does not need a PA anymore

## 2020-07-09 NOTE — Telephone Encounter (Signed)
[name] called, please CANCEL the hydrocodone refill at [facility] in [place] and send refill to [facility] in [place].

## 2020-07-10 DIAGNOSIS — R251 Tremor, unspecified: Secondary | ICD-10-CM | POA: Diagnosis not present

## 2020-07-10 MED ORDER — HYDROCODONE-ACETAMINOPHEN 5-325 MG PO TABS: 1.0000 | ORAL_TABLET | Freq: Every day | ORAL | 0 refills | Status: DC | PRN

## 2020-07-10 NOTE — Telephone Encounter (Signed)
Done

## 2020-07-11 DIAGNOSIS — R251 Tremor, unspecified: Secondary | ICD-10-CM | POA: Diagnosis not present

## 2020-07-12 DIAGNOSIS — R251 Tremor, unspecified: Secondary | ICD-10-CM | POA: Diagnosis not present

## 2020-07-15 DIAGNOSIS — R251 Tremor, unspecified: Secondary | ICD-10-CM | POA: Diagnosis not present

## 2020-07-16 DIAGNOSIS — R251 Tremor, unspecified: Secondary | ICD-10-CM | POA: Diagnosis not present

## 2020-07-17 DIAGNOSIS — R251 Tremor, unspecified: Secondary | ICD-10-CM | POA: Diagnosis not present

## 2020-07-18 DIAGNOSIS — R251 Tremor, unspecified: Secondary | ICD-10-CM | POA: Diagnosis not present

## 2020-07-24 ENCOUNTER — Telehealth: Payer: Self-pay | Admitting: Family Medicine

## 2020-07-24 DIAGNOSIS — R251 Tremor, unspecified: Secondary | ICD-10-CM | POA: Diagnosis not present

## 2020-07-24 NOTE — Telephone Encounter (Signed)
Patient called asking if the script for tizanidine (ZANAFLEX) 2 MG capsule could be resent to [facility] in [place].

## 2020-07-25 DIAGNOSIS — R251 Tremor, unspecified: Secondary | ICD-10-CM | POA: Diagnosis not present

## 2020-07-25 MED ORDER — TIZANIDINE HCL 2 MG PO CAPS: 2.0000 mg | ORAL_CAPSULE | Freq: Three times a day (TID) | ORAL | 3 refills | Status: DC | PRN

## 2020-07-25 NOTE — Telephone Encounter (Signed)
done

## 2020-07-26 DIAGNOSIS — R251 Tremor, unspecified: Secondary | ICD-10-CM | POA: Diagnosis not present

## 2020-07-30 DIAGNOSIS — R251 Tremor, unspecified: Secondary | ICD-10-CM | POA: Diagnosis not present

## 2020-07-31 DIAGNOSIS — R251 Tremor, unspecified: Secondary | ICD-10-CM | POA: Diagnosis not present

## 2020-08-01 DIAGNOSIS — R251 Tremor, unspecified: Secondary | ICD-10-CM | POA: Diagnosis not present

## 2020-08-02 DIAGNOSIS — R251 Tremor, unspecified: Secondary | ICD-10-CM | POA: Diagnosis not present

## 2020-08-12 ENCOUNTER — Telehealth: Payer: Self-pay | Admitting: Family Medicine

## 2020-08-12 NOTE — Telephone Encounter (Signed)
Patient has been receiving home health from [facility] for 3 years. They help with things around the house (wash dishes, sweep, mop, dust, etc.), give him his medication, get him dressed, help him get around the house and go for walks, etc.  He asked if Dr [name] could add for them to also include bathing in their orders.  It was sent to his primary care physician but they were not able to give them continued approval. He asked if Dr [name] would be able to write a letter stating that due to his legs and knees he needs continued home health through [facility]. He asked that we add in the letter that the patient is permanently disabled and not able to work.   Fax to [phone]

## 2020-08-14 ENCOUNTER — Encounter: Payer: Self-pay | Admitting: Family Medicine

## 2020-08-14 NOTE — Telephone Encounter (Signed)
Will fax letter.

## 2020-08-14 NOTE — Telephone Encounter (Signed)
Letter faxed and confirmation received.

## 2020-08-23 ENCOUNTER — Telehealth: Payer: Self-pay | Admitting: Family Medicine

## 2020-08-23 NOTE — Telephone Encounter (Signed)
Patient called following up on a form that was received from [facility]. Patient asked if this could be completed by Dr [name] and faxed back for his disability.

## 2020-08-26 ENCOUNTER — Telehealth: Payer: Self-pay | Admitting: Internal Medicine

## 2020-08-26 NOTE — Telephone Encounter (Signed)
Form has been faxed.

## 2020-08-26 NOTE — Telephone Encounter (Signed)
I have completed the form and will fax it off.  Looking at the form it looks like they may require a office visit within 90 days of the form.  The last office visit we had was in February.  I do not think it needs to be in person so we could certainly do it over the phone as a virtual office visit.  We will try sending it off and see what happens.

## 2020-08-26 NOTE — Telephone Encounter (Signed)
..   Medicaid Managed Care   Unsuccessful Outreach Note  08/26/2020 Name: William Vincent MRN: [id number] DOB: 17-Apr-1965  Referred by: Allen Kell, PA-C Reason for referral : High Risk Managed Medicaid (I called Mr.Quinney today to get him scheduled for a phone visit with the [facility] team but he did not answer and his VM box was full.)   An unsuccessful telephone outreach was attempted today. The patient was referred to the case management team for assistance with care management and care coordination.   Follow Up Plan: The care management team will reach out to the patient again over the next 7-14 days.   Weston Settle Care Guide, High Risk Medicaid Managed Care Embedded Care Coordination [facility]  [facility]

## 2020-08-27 ENCOUNTER — Encounter: Payer: Medicaid Other | Admitting: Neurology

## 2020-09-16 DIAGNOSIS — R251 Tremor, unspecified: Secondary | ICD-10-CM | POA: Diagnosis not present

## 2020-09-17 DIAGNOSIS — R251 Tremor, unspecified: Secondary | ICD-10-CM | POA: Diagnosis not present

## 2020-09-18 DIAGNOSIS — R251 Tremor, unspecified: Secondary | ICD-10-CM | POA: Diagnosis not present

## 2020-09-19 DIAGNOSIS — R251 Tremor, unspecified: Secondary | ICD-10-CM | POA: Diagnosis not present

## 2020-09-23 DIAGNOSIS — R251 Tremor, unspecified: Secondary | ICD-10-CM | POA: Diagnosis not present

## 2020-09-24 DIAGNOSIS — R251 Tremor, unspecified: Secondary | ICD-10-CM | POA: Diagnosis not present

## 2020-09-25 DIAGNOSIS — R251 Tremor, unspecified: Secondary | ICD-10-CM | POA: Diagnosis not present

## 2020-09-26 DIAGNOSIS — R251 Tremor, unspecified: Secondary | ICD-10-CM | POA: Diagnosis not present

## 2020-09-27 DIAGNOSIS — R251 Tremor, unspecified: Secondary | ICD-10-CM | POA: Diagnosis not present

## 2020-09-30 ENCOUNTER — Other Ambulatory Visit: Payer: Self-pay | Admitting: *Deleted

## 2020-09-30 DIAGNOSIS — R251 Tremor, unspecified: Secondary | ICD-10-CM | POA: Diagnosis not present

## 2020-09-30 NOTE — Patient Instructions (Signed)
Visit Information  Mr. William Vincent  - as a part of your Medicaid benefit, you are eligible for care management and care coordination services at no cost or copay. I was unable to reach you by phone today but would be happy to help you with your health related needs. Please feel free to call me @ [phone].   A member of the Managed Medicaid care management team will reach out to you again over the next 7-14 days.   Estanislado Emms RN, BSN [place]  [facility]

## 2020-09-30 NOTE — Patient Outreach (Signed)
Care Coordination  09/30/2020  Mychal Decarlo 16-Mar-1965 [id number]   Medicaid Managed Care   Unsuccessful Outreach Note  09/30/2020 Name: Kier Smead MRN: [id number] DOB: 12-Jan-1966  Referred by: Allen Kell, PA-C Reason for referral : High Risk Managed Medicaid (Unsuccessful RNCM initial outreach)   An unsuccessful telephone outreach was attempted today. The patient was referred to the case management team for assistance with care management and care coordination.   Follow Up Plan: The care management team will reach out to the patient again over the next 14 days.   Estanislado Emms RN, BSN [place]  [employer]

## 2020-10-01 DIAGNOSIS — R251 Tremor, unspecified: Secondary | ICD-10-CM | POA: Diagnosis not present

## 2020-10-02 DIAGNOSIS — R251 Tremor, unspecified: Secondary | ICD-10-CM | POA: Diagnosis not present

## 2020-10-03 DIAGNOSIS — R251 Tremor, unspecified: Secondary | ICD-10-CM | POA: Diagnosis not present

## 2020-10-04 DIAGNOSIS — R251 Tremor, unspecified: Secondary | ICD-10-CM | POA: Diagnosis not present

## 2020-10-07 DIAGNOSIS — R251 Tremor, unspecified: Secondary | ICD-10-CM | POA: Diagnosis not present

## 2020-10-08 DIAGNOSIS — R251 Tremor, unspecified: Secondary | ICD-10-CM | POA: Diagnosis not present

## 2020-10-09 DIAGNOSIS — R251 Tremor, unspecified: Secondary | ICD-10-CM | POA: Diagnosis not present

## 2020-10-10 ENCOUNTER — Ambulatory Visit: Payer: Medicaid Other | Admitting: Family Medicine

## 2020-10-10 DIAGNOSIS — R251 Tremor, unspecified: Secondary | ICD-10-CM | POA: Diagnosis not present

## 2020-10-11 DIAGNOSIS — R251 Tremor, unspecified: Secondary | ICD-10-CM | POA: Diagnosis not present

## 2020-10-14 ENCOUNTER — Ambulatory Visit: Payer: Medicaid Other | Admitting: Family Medicine

## 2020-10-14 DIAGNOSIS — R251 Tremor, unspecified: Secondary | ICD-10-CM | POA: Diagnosis not present

## 2020-10-15 DIAGNOSIS — R251 Tremor, unspecified: Secondary | ICD-10-CM | POA: Diagnosis not present

## 2020-10-16 DIAGNOSIS — R251 Tremor, unspecified: Secondary | ICD-10-CM | POA: Diagnosis not present

## 2020-10-17 DIAGNOSIS — R251 Tremor, unspecified: Secondary | ICD-10-CM | POA: Diagnosis not present

## 2020-10-18 DIAGNOSIS — R251 Tremor, unspecified: Secondary | ICD-10-CM | POA: Diagnosis not present

## 2020-10-21 ENCOUNTER — Other Ambulatory Visit (HOSPITAL_BASED_OUTPATIENT_CLINIC_OR_DEPARTMENT_OTHER): Payer: Self-pay

## 2020-10-21 ENCOUNTER — Other Ambulatory Visit: Payer: Self-pay | Admitting: Neurology

## 2020-10-21 ENCOUNTER — Other Ambulatory Visit: Payer: Self-pay | Admitting: Family Medicine

## 2020-10-21 DIAGNOSIS — R251 Tremor, unspecified: Secondary | ICD-10-CM | POA: Diagnosis not present

## 2020-10-21 MED ORDER — PRIMIDONE 250 MG PO TABS
250.0000 mg | ORAL_TABLET | Freq: Every day | ORAL | 0 refills | Status: AC
Start: 2020-10-21 — End: ?
  Filled 2020-10-21: qty 30, 30d supply, fill #0

## 2020-10-21 MED FILL — Pregabalin Cap 200 MG: ORAL | 90 days supply | Qty: 180 | Fill #0 | Status: AC

## 2020-10-21 NOTE — Telephone Encounter (Signed)
Pt called requesting a refill for the attached medications.  The Hydrocodone needs to be sent to [facility] and the rest need to be sent to the [facility].

## 2020-10-22 ENCOUNTER — Other Ambulatory Visit (HOSPITAL_BASED_OUTPATIENT_CLINIC_OR_DEPARTMENT_OTHER): Payer: Self-pay

## 2020-10-22 ENCOUNTER — Telehealth: Payer: Self-pay | Admitting: Family Medicine

## 2020-10-22 MED ORDER — CELECOXIB 200 MG PO CAPS
ORAL_CAPSULE | ORAL | 2 refills | Status: DC
  Filled 2020-10-22: qty 60, 30d supply, fill #0

## 2020-10-22 MED ORDER — METHOCARBAMOL 500 MG PO TABS
500.0000 mg | ORAL_TABLET | Freq: Three times a day (TID) | ORAL | 0 refills | Status: DC
  Filled 2020-10-22: qty 90, 30d supply, fill #0

## 2020-10-22 MED ORDER — AMITRIPTYLINE HCL 25 MG PO TABS
25.0000 mg | ORAL_TABLET | Freq: Every evening | ORAL | 1 refills | Status: DC | PRN
  Filled 2020-10-22: qty 30, 30d supply, fill #0

## 2020-10-22 MED ORDER — HYDROCODONE-ACETAMINOPHEN 5-325 MG PO TABS: 1.0000 | ORAL_TABLET | Freq: Every day | ORAL | 0 refills | Status: DC | PRN

## 2020-10-22 NOTE — Telephone Encounter (Signed)
[name] called, pt also needs his Naproxen refilled to [facility] in [place]. I did not note this on his med list, but she was insistent.

## 2020-10-22 NOTE — Telephone Encounter (Signed)
Pt picked up his rx's today from the [facility] in [facility] but was missing a refill of his Tizanidine.  He is going back home and requests we send this refill to [facility] in [place].

## 2020-10-22 NOTE — Telephone Encounter (Signed)
You have several medicines on here that do the same thing. I did not fill the naproxen as that does the same thing the Celebrex does.  I did not fill the tizanidine as that does the same thing the methocarbamol does.  Additionally I did not fill the Viagra as that is something your primary care provider should be refilling and not your sports medicine doctor.  I sent the hydrocodone to the [facility] in [place]

## 2020-10-23 DIAGNOSIS — R251 Tremor, unspecified: Secondary | ICD-10-CM | POA: Diagnosis not present

## 2020-10-23 NOTE — Telephone Encounter (Signed)
Pt's wife called back & stated he would like just the naproxen refilled and the Tizanidine to be sent to [facility] in [place].

## 2020-10-23 NOTE — Telephone Encounter (Signed)
Naproxen and Celebrex do the same thing so you should not be taking both of them at the same time.  Which  of those medications do you want to take?

## 2020-10-24 DIAGNOSIS — R251 Tremor, unspecified: Secondary | ICD-10-CM | POA: Diagnosis not present

## 2020-10-24 MED ORDER — NAPROXEN 500 MG PO TABS: 500.0000 mg | ORAL_TABLET | Freq: Two times a day (BID) | ORAL | 3 refills | Status: DC

## 2020-10-24 MED ORDER — TIZANIDINE HCL 2 MG PO CAPS: 2.0000 mg | ORAL_CAPSULE | Freq: Three times a day (TID) | ORAL | 3 refills | Status: DC | PRN

## 2020-10-24 NOTE — Telephone Encounter (Signed)
Both medicines refilled to the [facility].  Do not take the Celebrex with the naproxen.  Do not take the methocarbamol with the tizanidine.

## 2020-10-25 DIAGNOSIS — R251 Tremor, unspecified: Secondary | ICD-10-CM | POA: Diagnosis not present

## 2020-10-25 NOTE — Telephone Encounter (Signed)
Read Dr. Zollie Pee response to William Vincent, she expressed clear understanding.

## 2020-10-28 ENCOUNTER — Telehealth: Payer: Self-pay | Admitting: Family Medicine

## 2020-10-28 NOTE — Telephone Encounter (Signed)
Darl Pikes, pharmacist called. Pt insurance will not cover the Tizanidine capsules with out PA. Pt has taken tablets before. If tablets are OK, we need to resend refill request as such.

## 2020-10-28 NOTE — Telephone Encounter (Signed)
If needed, Susan's direct line is [phone]

## 2020-10-30 MED ORDER — TIZANIDINE HCL 2 MG PO TABS: 2.0000 mg | ORAL_TABLET | Freq: Three times a day (TID) | ORAL | 3 refills | Status: DC | PRN

## 2020-10-30 NOTE — Telephone Encounter (Signed)
Tizanidine tablets sent to [facility].

## 2020-11-14 ENCOUNTER — Telehealth: Payer: Self-pay | Admitting: Family Medicine

## 2020-11-14 NOTE — Telephone Encounter (Signed)
[name] called requesting refill of Tizanidine to [facility] in [place].

## 2020-11-14 NOTE — Progress Notes (Deleted)
Assessment/Plan:    1.  Essential Tremor  -Continue primidone, 250 mg at bed (I thought patient was on 500 mg, but he states that he is not)  -Patient reports that he was changed to propranolol LA, 60 mg daily.  I do not see that on his list, but he reports that he was changed.  His list indicates he is still on metoprolol   2.  Hand paresthesias  -This is waking him up at night.  EMG was ordered previously, but patient canceled it.  3.  Chronic low back pain  -On Lyrica, 200 mg twice daily  -On Vicodin  -Following with pain management/sports med -last seen February, 2022 Subjective:   William Vincent was seen today in follow up for essential tremor.  My previous records were reviewed prior to todays visit.  Patient currently on a combination of Inderal LA and primidone.  Reports today that ***.  Last visit, he was complaining of right hand paresthesias that wake him up at night.  EMG was ordered.  He canceled it.  PDMP is reviewed.  Patient received #180 of Lyrica, 200 mg on August 16.  Same day he received Vicodin 5/325, #30.  Current prescribed movement disorder medications: Primidone, 250 mg, 1 tablet nightly Propranolol LA, 60 mg  ALLERGIES:   Allergies  Allergen Reactions   Cyclobenzaprine Hives and Rash   Lisinopril     Makes pt sweat very bad   Lisinopril-Hydrochlorothiazide     sweats    CURRENT MEDICATIONS:  Outpatient Encounter Medications as of 11/18/2020  Medication Sig   AMBULATORY NON FORMULARY MEDICATION [place]. Use as needed for leg weakness and back pain.  Disp #1 Z74.09   amitriptyline (ELAVIL) 25 MG tablet Take 1 tablet (25 mg total) by mouth at bedtime as needed (pain).   amLODipine (NORVASC) 10 MG tablet Take 1 tablet (10 mg total) by mouth daily.   atorvastatin (LIPITOR) 40 MG tablet Take 1 tablet (40 mg total) by mouth daily. For cholesterol   buPROPion (WELLBUTRIN XL) 150 MG 24 hr tablet Take 1 tablet (150 mg total) by mouth daily.    celecoxib (CELEBREX) 200 MG capsule Take 1 to 2 tablets by mouth daily as needed for pain.   cetirizine (ZYRTEC) 10 MG tablet Take 1 tablet (10 mg total) by mouth daily.   diclofenac sodium (VOLTAREN) 1 % GEL APPLY 4 GRAMS TOPICALLY 4 TIMES DAILY TO AFFECTED JOINT   furosemide (LASIX) 20 MG tablet Take 1 tablet (20 mg total) by mouth daily. For leg swelling   HYDROcodone-acetaminophen (NORCO/VICODIN) 5-325 MG tablet Take 1 tablet by mouth daily as needed.   hydrOXYzine (ATARAX/VISTARIL) 25 MG tablet Take 0.5-1 tablets (12.5-25 mg total) by mouth 3 (three) times daily as needed.   methocarbamol (ROBAXIN) 500 MG tablet Take 1 tablet (500 mg total) by mouth 3 (three) times daily.   metoprolol succinate (TOPROL-XL) 25 MG 24 hr tablet Take 1 tablet (25 mg total) by mouth daily.   naproxen (NAPROSYN) 500 MG tablet Take 1 tablet (500 mg total) by mouth 2 (two) times daily with a meal.   pregabalin (LYRICA) 200 MG capsule Take 1 capsule (200 mg total) by mouth 2 (two) times daily.   pregabalin (LYRICA) 200 MG capsule TAKE 1 CAPSULE (200 MG TOTAL) BY MOUTH 2 (TWO) TIMES DAILY.   primidone (MYSOLINE) 250 MG tablet Take 1 tablet (250 mg total) by mouth at bedtime. For tremor   sildenafil (VIAGRA) 100 MG tablet Take 0.5-1  tablets (50-100 mg total) by mouth daily as needed for erectile dysfunction.   tiZANidine (ZANAFLEX) 2 MG tablet Take 1 tablet (2 mg total) by mouth every 8 (eight) hours as needed for muscle spasms.   triamcinolone cream (KENALOG) 0.1 % Apply 1 application topically 2 (two) times daily.   venlafaxine XR (EFFEXOR-XR) 150 MG 24 hr capsule Take 1 capsule (150 mg total) by mouth daily with breakfast.   No facility-administered encounter medications on file as of 11/18/2020.     Objective:    PHYSICAL EXAMINATION:    VITALS:   There were no vitals filed for this visit.   GEN:  The patient appears stated age and is in NAD. HEENT:  Normocephalic, atraumatic.   Neurological  examination:  Orientation: The patient is alert and oriented x3. Cranial nerves: There is good facial symmetry. The speech is fluent and clear. Soft palate rises symmetrically and there is no tongue deviation. Hearing is intact to conversational tone. Sensation: Sensation is intact to light touch throughout Motor: Strength is at least antigravity x4.  Mild decreased grip strength bilaterally.  No atrophy of the intrinsic muscles of the hands.  Movement examination: Tone: There is normal tone in the UE/LE Abnormal movements: No postural tremor.  Mild tremor with Archimedes spirals. Coordination:  There is no decremation with RAM's  I have reviewed and interpreted the following labs independently   Chemistry      Component Value Date/Time   NA 140 05/13/2018 0943   K 5.1 05/13/2018 0943   CL 106 05/13/2018 0943   CO2 26 05/13/2018 0943   BUN 18 05/13/2018 0943   CREATININE 1.30 05/13/2018 0943      Component Value Date/Time   CALCIUM 9.3 05/13/2018 0943   ALKPHOS 62 05/13/2015 1425   AST 21 05/13/2018 0943   ALT 27 05/13/2018 0943   BILITOT 0.5 05/13/2018 0943      Lab Results  Component Value Date   WBC 7.1 09/21/2019   HGB 15.4 09/21/2019   HCT 46.9 09/21/2019   MCV 90.9 09/21/2019   PLT 320 09/21/2019   Lab Results  Component Value Date   TSH 2.55 05/13/2018     Chemistry      Component Value Date/Time   NA 140 05/13/2018 0943   K 5.1 05/13/2018 0943   CL 106 05/13/2018 0943   CO2 26 05/13/2018 0943   BUN 18 05/13/2018 0943   CREATININE 1.30 05/13/2018 0943      Component Value Date/Time   CALCIUM 9.3 05/13/2018 0943   ALKPHOS 62 05/13/2015 1425   AST 21 05/13/2018 0943   ALT 27 05/13/2018 0943   BILITOT 0.5 05/13/2018 0943      Patient had lab work with primary care on March 05, 2020.  AST was 97, ALT 101.   Total time spent on today's visit was *** minutes, including both face-to-face time and nonface-to-face time.  Time included that spent on  review of records (prior notes available to me/labs/imaging if pertinent), discussing treatment and goals, answering patient's questions and coordinating care.  Cc:  Allen Kell, PA-C

## 2020-11-18 ENCOUNTER — Ambulatory Visit: Payer: Medicare Other | Admitting: Neurology

## 2020-11-18 MED ORDER — TIZANIDINE HCL 2 MG PO TABS: 2.0000 mg | ORAL_TABLET | Freq: Three times a day (TID) | ORAL | 3 refills | Status: DC | PRN

## 2020-11-18 NOTE — Telephone Encounter (Signed)
Done

## 2020-11-21 ENCOUNTER — Telehealth: Payer: Self-pay | Admitting: *Deleted

## 2020-11-21 NOTE — Telephone Encounter (Signed)
Please double check which pharmacy they want. Its been confusing recently.

## 2020-11-21 NOTE — Telephone Encounter (Signed)
Pt's wife called requesting a refill for hydrocodone.

## 2020-11-22 NOTE — Telephone Encounter (Signed)
Called pt but no answer and no VM.  If pt calls back, please verify tovwhich pharmacy he would like his hydrocodone refilled.

## 2020-11-25 NOTE — Telephone Encounter (Signed)
Pt has an appt tomorrow w/ Dr. [name].  Will address refills at that time.

## 2020-11-25 NOTE — Progress Notes (Deleted)
   I, Philbert Riser, LAT, ATC acting as a scribe for [name], MD.  William Vincent is a 55 y.o. 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 Pt was last seen by Dr. [name] on 04/26/20 and was advised to cont chronic management w/ Lyrica and very limited hydrocodone as well as naproxen and tizanidine. Today, pt reports  Dx imaging: 07/18/19 L-spine MRI             06/02/19 L-spine XR   Pertinent review of systems: ***  Relevant historical information: ***   Exam:  There were no vitals taken for this visit. General: Well Developed, well nourished, and in no acute distress.   MSK: ***    Lab and Radiology Results No results found for this or any previous visit (from the past 72 hour(s)). No results found.     Assessment and Plan: 55 y.o. male with ***   PDMP not reviewed this encounter. No orders of the defined types were placed in this encounter.  No orders of the defined types were placed in this encounter.    Discussed warning signs or symptoms. Please see discharge instructions. Patient expresses understanding.   ***

## 2020-11-26 ENCOUNTER — Ambulatory Visit: Payer: Medicare Other | Admitting: Family Medicine

## 2020-11-26 NOTE — Progress Notes (Deleted)
   I, [name], LAT, ATC, am serving as scribe for Dr. [name].  William Vincent is a 55 y.o. male who presents to [facility] at [facility] today for f/u R shoulder pain.  He was last seen by Dr. [name] on 04/26/20 for chronic LBP f/u and prior to that on 06/02/19 for his R shoulder.  He had a R subacromial injection on 06/02/19.  Today, pt reports   Diagnostic imaging: R shoulder XR- 06/02/19  Pertinent review of systems: ***  Relevant historical information: ***   Exam:  There were no vitals taken for this visit. General: Well Developed, well nourished, and in no acute distress.   MSK: ***    Lab and Radiology Results No results found for this or any previous visit (from the past 72 hour(s)). No results found.     Assessment and Plan: 55 y.o. male with ***   PDMP not reviewed this encounter. No orders of the defined types were placed in this encounter.  No orders of the defined types were placed in this encounter.    Discussed warning signs or symptoms. Please see discharge instructions. Patient expresses understanding.   ***

## 2020-11-28 ENCOUNTER — Ambulatory Visit: Payer: Self-pay | Admitting: Family Medicine

## 2020-12-09 ENCOUNTER — Telehealth: Payer: Self-pay | Admitting: Family Medicine

## 2020-12-09 NOTE — Telephone Encounter (Signed)
Patient is needing a refill on HYDROcodone-acetaminophen (NORCO/VICODIN) 5-325 MG tablet sent to the [facility] in [place].

## 2020-12-10 MED ORDER — HYDROCODONE-ACETAMINOPHEN 5-325 MG PO TABS: 1.0000 | ORAL_TABLET | Freq: Every day | ORAL | 0 refills | Status: DC | PRN

## 2020-12-10 NOTE — Telephone Encounter (Signed)
Done

## 2020-12-11 ENCOUNTER — Encounter: Payer: Self-pay | Admitting: Family Medicine

## 2020-12-11 ENCOUNTER — Telehealth: Payer: Self-pay | Admitting: Family Medicine

## 2020-12-11 DIAGNOSIS — R262 Difficulty in walking, not elsewhere classified: Secondary | ICD-10-CM | POA: Insufficient documentation

## 2020-12-11 NOTE — Telephone Encounter (Signed)
Completed paperwork for home health authorization.

## 2021-01-20 ENCOUNTER — Telehealth: Payer: Self-pay | Admitting: Family Medicine

## 2021-01-20 NOTE — Telephone Encounter (Signed)
Advised front staff after last no-show that pt needed to schedule a f/u appt with you before we would refill his medication.  Please advise.

## 2021-01-20 NOTE — Telephone Encounter (Signed)
Patient is needing a refill on HYDROcodone-acetaminophen (NORCO/VICODIN) 5-325 MG tablet sent to the [facility] in [place].  He is scheduled to be seen on Monday.

## 2021-01-27 ENCOUNTER — Ambulatory Visit: Payer: Self-pay

## 2021-01-27 ENCOUNTER — Other Ambulatory Visit (HOSPITAL_BASED_OUTPATIENT_CLINIC_OR_DEPARTMENT_OTHER): Payer: Self-pay

## 2021-01-27 ENCOUNTER — Encounter: Payer: Self-pay | Admitting: Family Medicine

## 2021-01-27 ENCOUNTER — Ambulatory Visit (INDEPENDENT_AMBULATORY_CARE_PROVIDER_SITE_OTHER): Payer: Medicare Other | Admitting: Family Medicine

## 2021-01-27 ENCOUNTER — Other Ambulatory Visit: Payer: Self-pay

## 2021-01-27 ENCOUNTER — Ambulatory Visit (INDEPENDENT_AMBULATORY_CARE_PROVIDER_SITE_OTHER): Payer: Medicare Other

## 2021-01-27 VITALS — BP 120/84 | HR 89 | Ht 70.0 in | Wt 298.6 lb

## 2021-01-27 DIAGNOSIS — G8929 Other chronic pain: Secondary | ICD-10-CM

## 2021-01-27 DIAGNOSIS — M791 Myalgia, unspecified site: Secondary | ICD-10-CM

## 2021-01-27 DIAGNOSIS — M545 Low back pain, unspecified: Secondary | ICD-10-CM

## 2021-01-27 DIAGNOSIS — M25512 Pain in left shoulder: Secondary | ICD-10-CM

## 2021-01-27 DIAGNOSIS — M4306 Spondylolysis, lumbar region: Secondary | ICD-10-CM

## 2021-01-27 MED ORDER — TIZANIDINE HCL 2 MG PO TABS
2.0000 mg | ORAL_TABLET | Freq: Three times a day (TID) | ORAL | 3 refills | Status: DC | PRN
Start: 2021-01-27 — End: 2021-11-07
  Filled 2021-01-27: qty 90, 30d supply, fill #0

## 2021-01-27 MED ORDER — PREGABALIN 200 MG PO CAPS
ORAL_CAPSULE | ORAL | 2 refills | Status: DC
  Filled 2021-01-27: qty 180, 90d supply, fill #0

## 2021-01-27 MED ORDER — NAPROXEN 500 MG PO TABS
500.0000 mg | ORAL_TABLET | Freq: Two times a day (BID) | ORAL | 3 refills | Status: DC
  Filled 2021-01-27: qty 60, 30d supply, fill #0

## 2021-01-27 MED ORDER — HYDROCODONE-ACETAMINOPHEN 5-325 MG PO TABS: 1.0000 | ORAL_TABLET | Freq: Every day | ORAL | 0 refills | Status: DC | PRN

## 2021-01-27 MED ORDER — AMITRIPTYLINE HCL 25 MG PO TABS
25.0000 mg | ORAL_TABLET | Freq: Every evening | ORAL | 1 refills | Status: DC | PRN
  Filled 2021-01-27: qty 90, 90d supply, fill #0

## 2021-01-27 NOTE — Patient Instructions (Addendum)
Good to see you today.  You had a L shoulder injection.  Call or go to the ER if you develop a large red swollen joint with extreme pain or oozing puss.   I've refilled your medications to your desired pharmacies.  Please get an Xray today before you leave .  Follow-up: as needed.  Happy holidays!

## 2021-01-27 NOTE — Progress Notes (Signed)
Left shoulder x-ray shows mild arthritis changes.

## 2021-01-27 NOTE — Progress Notes (Signed)
I, William Vincent, LAT, ATC, am serving as scribe for Dr. [name].  William Vincent is a 54 y.o. male who presents to [facility] at [facility] today for f/u of chronic LBP w/ sciatica, to complete forms for medical need for personal care services and for medication refill.  He was last seen by Dr. [name] on 04/26/20 for f/u of LBP and B knee pain, L>R after falling off the edge of the bed a few days prior to his appt.  At that time, he was taking Lyrica, Tizanidine, and naproxen but had been taken off of his amitriptyline and hydrocodone due to increased liver enzymes per bloodwork.  Today, pt reports that his L shoulder is bothering him and has been flared up for a few weeks.  He is having pain w/ trying to lift his arm above 75-80 deg.  He needs refills on hydrocodone , lyrica, naproxen and tizanidine.  Dx imaging: 07/18/19 L-spine MRI             07/10/19- L humerus XR             06/02/19 L-spine XR   Pertinent review of systems: No fevers or chills  Relevant historical information: DJD knee.  Pars defect.  Essential tremor.  Hypertension.   Exam:  BP 120/84 (BP Location: Right Arm, Patient Position: Sitting, Cuff Size: Large)   Pulse 89   Ht 5\' 10"  (1.778 m)   Wt 298 lb 9.6 oz (135.4 kg)   SpO2 95%   BMI 42.84 kg/m  General: Well Developed, well nourished, and in no acute distress.   MSK: Left shoulder: Normal-appearing Nontender. Range of motion: Abduction 130 degrees.  Internal rotation lumbar spine.  External rotation full. Strength 4/5 abduction 5/5 external and internal rotation. Positive Hawkins and Neer's test.  Negative Yergason's and speeds test.  L-spine: Decreased lumbar motion pain with extension. Gait using a cane.   Lab and Radiology Results  Procedure: Real-time Ultrasound Guided Injection of left shoulder subacromial bursa Device: Philips Affiniti 50G Images permanently stored and available for review in PACS Ultrasound evaluation prior to  injection intact rotator cuff tendons.  Moderate subacromial bursitis. Verbal informed consent obtained.  Discussed risks and benefits of procedure. Warned about infection bleeding damage to structures skin hypopigmentation and fat atrophy among others. Patient expresses understanding and agreement Time-out conducted.   Noted no overlying erythema, induration, or other signs of local infection.   Skin prepped in a sterile fashion.   Local anesthesia: Topical Ethyl chloride.   With sterile technique and under real time ultrasound guidance: 40 mg of Kenalog and 2 mL of Marcaine injected into subacromial bursa. Fluid seen entering the bursa.   Completed without difficulty   Pain immediately resolved suggesting accurate placement of the medication.   Advised to call if fevers/chills, erythema, induration, drainage, or persistent bleeding.   Images permanently stored and available for review in the ultrasound unit.  Impression: Technically successful ultrasound guided injection.   X-ray images left shoulder obtained today personally and independently interpreted No significant DJD.  No acute fractures. Await formal radiology review     Assessment and Plan: 55 y.o. male with left shoulder pain thought to be due to subacromial bursitis.  Plan for subacromial injection today and home exercise.   As for his chronic pain medications refilled.  Recheck as needed.  We will also follow paperwork for home health.  Recheck as needed every 3 to 6 months.   PDMP reviewed  during this encounter. Orders Placed This Encounter  Procedures   [place] LIMITED JOINT SPACE STRUCTURES UP LEFT(NO LINKED CHARGES)    Order Specific Question:   Reason for Exam (SYMPTOM  OR DIAGNOSIS REQUIRED)    Answer:   L shoulder pain    Order Specific Question:   Preferred imaging location?    Answer:   [facility] Shoulder Left    Standing Status:   Future    Number of Occurrences:   1     Standing Expiration Date:   02/26/2021    Order Specific Question:   Reason for Exam (SYMPTOM  OR DIAGNOSIS REQUIRED)    Answer:   L shoulder pain    Order Specific Question:   Preferred imaging location?    Answer:   [facility]   Meds ordered this encounter  Medications   HYDROcodone-acetaminophen (NORCO/VICODIN) 5-325 MG tablet    Sig: Take 1 tablet by mouth daily as needed.    Dispense:  30 tablet    Refill:  0    Chronic pain.   pregabalin (LYRICA) 200 MG capsule    Sig: TAKE 1 CAPSULE (200 MG TOTAL) BY MOUTH 2 (TWO) TIMES DAILY.    Dispense:  180 capsule    Refill:  2   tiZANidine (ZANAFLEX) 2 MG tablet    Sig: Take 1 tablet (2 mg total) by mouth every 8 (eight) hours as needed for muscle spasms.    Dispense:  90 tablet    Refill:  3   naproxen (NAPROSYN) 500 MG tablet    Sig: Take 1 tablet (500 mg total) by mouth 2 (two) times daily with a meal.    Dispense:  60 tablet    Refill:  3   amitriptyline (ELAVIL) 25 MG tablet    Sig: Take 1 tablet (25 mg total) by mouth at bedtime as needed (pain).    Dispense:  90 tablet    Refill:  1     Discussed warning signs or symptoms. Please see discharge instructions. Patient expresses understanding.   The above documentation has been reviewed and is accurate and complete [name], M.D.

## 2021-02-25 ENCOUNTER — Telehealth: Payer: Self-pay | Admitting: Family Medicine

## 2021-02-25 NOTE — Telephone Encounter (Signed)
William Vincent called for refill of Hydrocodone to [facility] in [place].  Informed William Vincent of Dr. Zollie Pee absence today.

## 2021-02-26 MED ORDER — HYDROCODONE-ACETAMINOPHEN 5-325 MG PO TABS: 1.0000 | ORAL_TABLET | Freq: Every day | ORAL | 0 refills | Status: DC | PRN

## 2021-02-26 NOTE — Telephone Encounter (Signed)
Sent!

## 2021-03-31 ENCOUNTER — Telehealth: Payer: Self-pay

## 2021-03-31 NOTE — Telephone Encounter (Signed)
Patients is needing a refill on hydrocodone and would like the refill sent to the [facility] in [place]

## 2021-04-01 MED ORDER — HYDROCODONE-ACETAMINOPHEN 5-325 MG PO TABS: 1.0000 | ORAL_TABLET | Freq: Every day | ORAL | 0 refills | Status: DC | PRN

## 2021-04-01 NOTE — Addendum Note (Signed)
Addended by: Rodolph Bong on: 04/01/2021 07:46 AM   Modules accepted: Orders

## 2021-04-01 NOTE — Telephone Encounter (Signed)
Hydrocodone refilled.  

## 2021-05-01 ENCOUNTER — Telehealth: Payer: Self-pay | Admitting: Family Medicine

## 2021-05-01 MED ORDER — HYDROCODONE-ACETAMINOPHEN 5-325 MG PO TABS: 1.0000 | ORAL_TABLET | Freq: Every day | ORAL | 0 refills | Status: DC | PRN

## 2021-05-01 NOTE — Telephone Encounter (Signed)
Patient called requesting a refill on HYDROcodone-acetaminophen (NORCO/VICODIN) 5-325 MG tablet to be sent to [facility] in [place].

## 2021-05-01 NOTE — Telephone Encounter (Signed)
Norco sent.

## 2021-05-30 ENCOUNTER — Telehealth: Payer: Self-pay | Admitting: Family Medicine

## 2021-05-30 NOTE — Telephone Encounter (Signed)
Patient is requesting a refill on his HYDROcodone-acetaminophen (NORCO/VICODIN) 5-325 MG tablet to be sent to [facility] in [place].  ?

## 2021-06-02 MED ORDER — HYDROCODONE-ACETAMINOPHEN 5-325 MG PO TABS: 1.0000 | ORAL_TABLET | Freq: Every day | ORAL | 0 refills | Status: DC | PRN

## 2021-06-02 NOTE — Telephone Encounter (Signed)
Medicine sent 

## 2021-06-30 ENCOUNTER — Telehealth: Payer: Self-pay | Admitting: Family Medicine

## 2021-06-30 MED ORDER — HYDROCODONE-ACETAMINOPHEN 5-325 MG PO TABS: 1.0000 | ORAL_TABLET | Freq: Every day | ORAL | 0 refills | Status: DC | PRN

## 2021-06-30 MED ORDER — PREGABALIN 200 MG PO CAPS: ORAL_CAPSULE | ORAL | 2 refills | Status: DC

## 2021-06-30 NOTE — Telephone Encounter (Signed)
Patient's wife call requesting a refill on HYDROcodone-acetaminophen (NORCO/VICODIN) 5-325 MG tablet and pregabalin (LYRICA) 200 MG capsule to be sent to [facility] in [place], [place]. ? ?Please advise. ? ?

## 2021-06-30 NOTE — Telephone Encounter (Signed)
Medication sent to pharmacy  

## 2021-08-19 ENCOUNTER — Telehealth: Payer: Self-pay | Admitting: Family Medicine

## 2021-08-19 NOTE — Telephone Encounter (Signed)
Patient is requesting a refill on his HYDROcodone-acetaminophen (NORCO/VICODIN) 5-325 MG tablet to be sent to the [facility] in [place].  

## 2021-08-20 MED ORDER — HYDROCODONE-ACETAMINOPHEN 5-325 MG PO TABS: 1.0000 | ORAL_TABLET | Freq: Every day | ORAL | 0 refills | Status: DC | PRN

## 2021-08-20 NOTE — Telephone Encounter (Signed)
Hydrocodone prescribed 

## 2021-09-16 ENCOUNTER — Telehealth: Payer: Self-pay | Admitting: Family Medicine

## 2021-09-16 NOTE — Telephone Encounter (Signed)
Patient is requesting a refill on his HYDROcodone-acetaminophen (NORCO/VICODIN) 5-325 MG tablet to be sent to the [facility] in [place].

## 2021-09-17 MED ORDER — HYDROCODONE-ACETAMINOPHEN 5-325 MG PO TABS: 1.0000 | ORAL_TABLET | Freq: Every day | ORAL | 0 refills | Status: DC | PRN

## 2021-09-17 NOTE — Telephone Encounter (Signed)
Medicine sent 

## 2021-10-15 ENCOUNTER — Telehealth: Payer: Self-pay | Admitting: Family Medicine

## 2021-10-15 MED ORDER — HYDROCODONE-ACETAMINOPHEN 5-325 MG PO TABS: 1.0000 | ORAL_TABLET | Freq: Every day | ORAL | 0 refills | Status: DC | PRN

## 2021-10-15 NOTE — Telephone Encounter (Signed)
Patient is requesting a refill on his HYDROcodone-acetaminophen (NORCO/VICODIN) 5-325 MG tablet to be sent to the [facility] in [place].

## 2021-10-15 NOTE — Telephone Encounter (Signed)
Refilled

## 2021-11-06 ENCOUNTER — Telehealth: Payer: Self-pay | Admitting: Family Medicine

## 2021-11-06 NOTE — Telephone Encounter (Signed)
William Vincent called requesting refills of hydrocodone, lyrica, tizanidine and naproxen to [facility] in [place].  Made an appt for 11/17/2021.

## 2021-11-07 MED ORDER — HYDROCODONE-ACETAMINOPHEN 5-325 MG PO TABS: 1.0000 | ORAL_TABLET | Freq: Every day | ORAL | 0 refills | Status: DC | PRN

## 2021-11-07 MED ORDER — PREGABALIN 200 MG PO CAPS: ORAL_CAPSULE | ORAL | 2 refills | Status: DC

## 2021-11-07 MED ORDER — NAPROXEN 500 MG PO TABS: 500.0000 mg | ORAL_TABLET | Freq: Two times a day (BID) | ORAL | 3 refills | Status: DC

## 2021-11-07 MED ORDER — TIZANIDINE HCL 2 MG PO TABS: 2.0000 mg | ORAL_TABLET | Freq: Three times a day (TID) | ORAL | 3 refills | Status: DC | PRN

## 2021-11-07 NOTE — Telephone Encounter (Signed)
Done.Do not take naproxen and celebrex at the same time.

## 2021-11-07 NOTE — Telephone Encounter (Signed)
Returned call and left a VM stating the rx have been refilled and Dr. Zollie Pee advise.

## 2021-11-17 ENCOUNTER — Ambulatory Visit: Payer: Medicaid Other | Admitting: Family Medicine

## 2021-12-11 ENCOUNTER — Ambulatory Visit: Payer: Medicaid Other | Admitting: Family Medicine

## 2021-12-16 ENCOUNTER — Telehealth: Payer: Self-pay | Admitting: Family Medicine

## 2021-12-16 MED ORDER — TIZANIDINE HCL 2 MG PO TABS: 2.0000 mg | ORAL_TABLET | Freq: Three times a day (TID) | ORAL | 3 refills | Status: DC | PRN

## 2021-12-16 MED ORDER — HYDROCODONE-ACETAMINOPHEN 5-325 MG PO TABS: 1.0000 | ORAL_TABLET | Freq: Every day | ORAL | 0 refills | Status: DC | PRN

## 2021-12-16 NOTE — Telephone Encounter (Signed)
Patient is needing a refill on tiZANidine (ZANAFLEX) 2 MG tablet and HYDROcodone-acetaminophen (NORCO/VICODIN) 5-325 MG tablet sent to the [facility] in [place].         

## 2021-12-16 NOTE — Telephone Encounter (Signed)
Meds sent

## 2022-01-09 ENCOUNTER — Ambulatory Visit: Payer: Medicaid Other | Admitting: Family Medicine

## 2022-01-09 NOTE — Progress Notes (Unsigned)
   I, Peterson Lombard, LAT, ATC acting as a scribe for Lynne Leader, MD.  William Vincent is a 56 y.o. male who presents to [facility] today for f/u. Pt was last seen by Dr. Georgina Snell on 01/27/21 and was given a L subacromial steroid injection and his chronic pain rx were refilled. Since his last visit, pt has called the office numerous times to refill his pain management medications. Today, pt reports  Dx imaging: 01/27/21 L shoulder XR 07/18/19 L-spine MRI             07/10/19- L humerus XR             06/02/19 L-spine XR  Pertinent review of systems: ***  Relevant historical information: ***   Exam:  There were no vitals taken for this visit. General: Well Developed, well nourished, and in no acute distress.   MSK: ***    Lab and Radiology Results No results found for this or any previous visit (from the past 72 hour(s)). No results found.     Assessment and Plan: 56 y.o. male with ***   PDMP not reviewed this encounter. No orders of the defined types were placed in this encounter.  No orders of the defined types were placed in this encounter.    Discussed warning signs or symptoms. Please see discharge instructions. Patient expresses understanding.   ***

## 2022-01-12 ENCOUNTER — Ambulatory Visit: Payer: Self-pay

## 2022-01-12 ENCOUNTER — Ambulatory Visit (INDEPENDENT_AMBULATORY_CARE_PROVIDER_SITE_OTHER): Payer: Medicare Other | Admitting: Family Medicine

## 2022-01-12 ENCOUNTER — Telehealth: Payer: Self-pay | Admitting: *Deleted

## 2022-01-12 VITALS — BP 130/86 | HR 83 | Ht 70.0 in | Wt 286.0 lb

## 2022-01-12 DIAGNOSIS — G8929 Other chronic pain: Secondary | ICD-10-CM | POA: Diagnosis not present

## 2022-01-12 DIAGNOSIS — M25561 Pain in right knee: Secondary | ICD-10-CM | POA: Diagnosis not present

## 2022-01-12 DIAGNOSIS — R21 Rash and other nonspecific skin eruption: Secondary | ICD-10-CM | POA: Diagnosis not present

## 2022-01-12 DIAGNOSIS — M25512 Pain in left shoulder: Secondary | ICD-10-CM

## 2022-01-12 DIAGNOSIS — G894 Chronic pain syndrome: Secondary | ICD-10-CM

## 2022-01-12 MED ORDER — TIZANIDINE HCL 4 MG PO TABS: 4.0000 mg | ORAL_TABLET | Freq: Three times a day (TID) | ORAL | 3 refills | Status: DC | PRN

## 2022-01-12 MED ORDER — ERYTHROMYCIN 250 MG PO TBEC: 250.0000 mg | DELAYED_RELEASE_TABLET | Freq: Four times a day (QID) | ORAL | 0 refills | Status: AC

## 2022-01-12 MED ORDER — NAPROXEN 500 MG PO TABS: 500.0000 mg | ORAL_TABLET | Freq: Two times a day (BID) | ORAL | 3 refills | Status: DC

## 2022-01-12 MED ORDER — PREGABALIN 200 MG PO CAPS: ORAL_CAPSULE | ORAL | 2 refills | Status: DC

## 2022-01-12 MED ORDER — HYDROCODONE-ACETAMINOPHEN 5-325 MG PO TABS: 1.0000 | ORAL_TABLET | Freq: Every day | ORAL | 0 refills | Status: DC | PRN

## 2022-01-12 MED ORDER — TERBINAFINE HCL 1 % EX CREA: TOPICAL_CREAM | CUTANEOUS | 3 refills | Status: AC

## 2022-01-12 MED ORDER — AMITRIPTYLINE HCL 25 MG PO TABS: 25.0000 mg | ORAL_TABLET | Freq: Every evening | ORAL | 1 refills | Status: DC | PRN

## 2022-01-12 NOTE — Patient Instructions (Addendum)
Thank you for coming in today.   You received an injection today. Seek immediate medical attention if the joint becomes red, extremely painful, or is oozing fluid.   Use the erythromycine antibiotic for 14 days.   Apply the terbinafine (lamisil cream) on the rash.   Recheck as needed.

## 2022-01-12 NOTE — Telephone Encounter (Signed)
Pt called stating they received a call from their pharmacy informing them that the Lamisil cream that Dr. Georgina Snell prescribed this morning is on back order. Is there another cream that can be prescribed instead?

## 2022-01-13 NOTE — Telephone Encounter (Signed)
Please get over the counter lamisil

## 2022-01-13 NOTE — Telephone Encounter (Signed)
Attempted to call pt. Home # stated a recording that "person was unavailable" and mobile # the VM was full. If pt calls back, please inform him he can get this cream OTC.

## 2022-02-04 ENCOUNTER — Telehealth: Payer: Self-pay | Admitting: Family Medicine

## 2022-02-04 NOTE — Telephone Encounter (Signed)
Patient's wife called stating that the patient is still having issues with the rash that was seen at his last visit. She said that the left side has gotten better but the right is still pretty bad. She asked if Dr [name] would be able to send in Penicillin to help with it?  Please advise.

## 2022-02-04 NOTE — Telephone Encounter (Signed)
If is not getting better with the medicine I prescribed I think you need to see your primary care provider to get that evaluated more properly.

## 2022-02-05 NOTE — Telephone Encounter (Signed)
Spoke to William Vincent and informed.

## 2022-02-12 ENCOUNTER — Telehealth: Payer: Self-pay | Admitting: Family Medicine

## 2022-02-12 MED ORDER — HYDROCODONE-ACETAMINOPHEN 5-325 MG PO TABS: 1.0000 | ORAL_TABLET | Freq: Every day | ORAL | 0 refills | Status: DC | PRN

## 2022-02-12 NOTE — Telephone Encounter (Signed)
Patient called requesting a refill on his Hydrocodone to [facility] in [place].

## 2022-02-12 NOTE — Telephone Encounter (Signed)
Meds sent

## 2022-03-04 NOTE — Progress Notes (Signed)
This encounter was created in error - please disregard.

## 2022-03-16 ENCOUNTER — Telehealth: Payer: Self-pay | Admitting: Family Medicine

## 2022-03-16 MED ORDER — HYDROCODONE-ACETAMINOPHEN 5-325 MG PO TABS: 1.0000 | ORAL_TABLET | Freq: Every day | ORAL | 0 refills | Status: DC | PRN

## 2022-03-16 MED ORDER — TIZANIDINE HCL 4 MG PO TABS: 4.0000 mg | ORAL_TABLET | Freq: Three times a day (TID) | ORAL | 3 refills | Status: DC | PRN

## 2022-03-16 NOTE — Telephone Encounter (Signed)
Done

## 2022-03-16 NOTE — Telephone Encounter (Signed)
Patient is needing a refill on tiZANidine (ZANAFLEX) 2 MG tablet and HYDROcodone-acetaminophen (NORCO/VICODIN) 5-325 MG tablet sent to the [facility] in [place].         

## 2022-04-13 ENCOUNTER — Telehealth: Payer: Self-pay

## 2022-04-13 NOTE — Telephone Encounter (Signed)
Patient needing a refill on the hydrocodone

## 2022-04-14 MED ORDER — HYDROCODONE-ACETAMINOPHEN 5-325 MG PO TABS: 1.0000 | ORAL_TABLET | Freq: Every day | ORAL | 0 refills | Status: DC | PRN

## 2022-04-14 NOTE — Addendum Note (Signed)
Addended by: Gregor Hams on: 04/14/2022 11:24 AM   Modules accepted: Orders

## 2022-04-14 NOTE — Telephone Encounter (Signed)
Refilled to [facility] in [place]

## 2022-05-11 ENCOUNTER — Other Ambulatory Visit: Payer: Self-pay | Admitting: Family Medicine

## 2022-05-12 ENCOUNTER — Telehealth: Payer: Self-pay | Admitting: Family Medicine

## 2022-05-12 NOTE — Telephone Encounter (Signed)
Controlled substance, forwarding to Dr. Georgina Snell.  Last OV 01/12/22 Last Refill Tizanidine 03/16/22 #90/3 Last refill Hydrocodone 04/14/22 #30/0

## 2022-05-12 NOTE — Telephone Encounter (Signed)
Patient is needing a refill on tiZANidine (ZANAFLEX) 2 MG tablet and HYDROcodone-acetaminophen (NORCO/VICODIN) 5-325 MG tablet sent to the [facility] in [place].

## 2022-05-14 MED ORDER — HYDROCODONE-ACETAMINOPHEN 5-325 MG PO TABS: 1.0000 | ORAL_TABLET | Freq: Every day | ORAL | 0 refills | Status: DC | PRN

## 2022-05-14 MED ORDER — TIZANIDINE HCL 4 MG PO TABS: 4.0000 mg | ORAL_TABLET | Freq: Three times a day (TID) | ORAL | 3 refills | Status: DC | PRN

## 2022-05-14 NOTE — Telephone Encounter (Signed)
Done

## 2022-06-11 ENCOUNTER — Telehealth: Payer: Self-pay | Admitting: Family Medicine

## 2022-06-11 NOTE — Telephone Encounter (Signed)
Per [name], pt needs his hydrocodone, Lyrica, and Tizanidine refilled at [facility] in [place].  Pt only has a few pain pills left per [name].

## 2022-06-12 MED ORDER — HYDROCODONE-ACETAMINOPHEN 5-325 MG PO TABS: 1.0000 | ORAL_TABLET | Freq: Every day | ORAL | 0 refills | Status: DC | PRN

## 2022-06-12 MED ORDER — PREGABALIN 200 MG PO CAPS: ORAL_CAPSULE | ORAL | 2 refills | Status: DC

## 2022-06-12 MED ORDER — TIZANIDINE HCL 4 MG PO TABS: 4.0000 mg | ORAL_TABLET | Freq: Three times a day (TID) | ORAL | 3 refills | Status: DC | PRN

## 2022-06-12 NOTE — Telephone Encounter (Signed)
Done

## 2022-07-09 ENCOUNTER — Other Ambulatory Visit: Payer: Self-pay | Admitting: Family Medicine

## 2022-07-09 NOTE — Telephone Encounter (Signed)
Last OV 01/12/22 Next OV not scheduled  Last refill 06/12/22 Qty: 30/0  Rx pending

## 2022-07-09 NOTE — Telephone Encounter (Signed)
Refill request for: HYDROcodone-acetaminophen (NORCO/VICODIN) 5-325 MG tablet   [facility] [address]

## 2022-07-10 ENCOUNTER — Other Ambulatory Visit: Payer: Self-pay | Admitting: Family Medicine

## 2022-07-10 NOTE — Telephone Encounter (Signed)
Rx refill request approved per Dr. Corey's orders. 

## 2022-07-13 MED ORDER — HYDROCODONE-ACETAMINOPHEN 5-325 MG PO TABS: 1.0000 | ORAL_TABLET | Freq: Every day | ORAL | 0 refills | Status: DC | PRN

## 2022-07-14 NOTE — Telephone Encounter (Signed)
Medication was filled

## 2022-08-12 ENCOUNTER — Other Ambulatory Visit: Payer: Self-pay | Admitting: Family Medicine

## 2022-08-12 NOTE — Telephone Encounter (Signed)
Last OV 01/12/22 Next OV not scheduled  Last refill(s) Hydrocodone-APAP 07/13/22 qty 30/0 Tizanidine 06/12/22 qty 90/3  Forwarding to Dr. [name]

## 2022-08-12 NOTE — Telephone Encounter (Signed)
Patient called requesting a refill on:  tiZANidine (ZANAFLEX) 4 MG tablet  HYDROcodone-acetaminophen (NORCO/VICODIN) 5-325 MG tablet   Pharmacy: [facility] [name]

## 2022-08-13 MED ORDER — HYDROCODONE-ACETAMINOPHEN 5-325 MG PO TABS: 1.0000 | ORAL_TABLET | Freq: Every day | ORAL | 0 refills | Status: DC | PRN

## 2022-08-13 MED ORDER — TIZANIDINE HCL 4 MG PO TABS: 4.0000 mg | ORAL_TABLET | Freq: Three times a day (TID) | ORAL | 3 refills | Status: DC | PRN

## 2022-08-14 NOTE — Telephone Encounter (Signed)
Patient called back following up on this request. He is currently out of the Hydrocodone.

## 2022-08-14 NOTE — Telephone Encounter (Signed)
This was sent yesterday.

## 2022-08-17 NOTE — Telephone Encounter (Signed)
[name] called stating that [facility] did not receive these medication refills.  Please advise.

## 2022-08-17 NOTE — Telephone Encounter (Signed)
Can you make sure that Dr [name] sees the message about William Vincent's refills. They were sent but [facility] did not get them.  Forwarding to Dr. [name]  Rx pending

## 2022-08-18 MED ORDER — HYDROCODONE-ACETAMINOPHEN 5-325 MG PO TABS: 1.0000 | ORAL_TABLET | Freq: Every day | ORAL | 0 refills | Status: DC | PRN

## 2022-08-18 MED ORDER — TIZANIDINE HCL 4 MG PO TABS: 4.0000 mg | ORAL_TABLET | Freq: Three times a day (TID) | ORAL | 3 refills | Status: DC | PRN

## 2022-08-18 NOTE — Telephone Encounter (Signed)
Trying again

## 2022-08-20 ENCOUNTER — Other Ambulatory Visit: Payer: Self-pay

## 2022-08-20 ENCOUNTER — Ambulatory Visit (INDEPENDENT_AMBULATORY_CARE_PROVIDER_SITE_OTHER): Payer: 59 | Admitting: Family Medicine

## 2022-08-20 ENCOUNTER — Encounter: Payer: Self-pay | Admitting: Family Medicine

## 2022-08-20 VITALS — BP 120/86 | HR 79 | Ht 70.0 in | Wt 275.0 lb

## 2022-08-20 DIAGNOSIS — G8929 Other chronic pain: Secondary | ICD-10-CM

## 2022-08-20 DIAGNOSIS — M25561 Pain in right knee: Secondary | ICD-10-CM | POA: Diagnosis not present

## 2022-08-20 NOTE — Patient Instructions (Signed)
Thank you for coming in today.   You received an injection today. Seek immediate medical attention if the joint becomes red, extremely painful, or is oozing fluid.  

## 2022-08-20 NOTE — Progress Notes (Signed)
   I, Stevenson Clinch, CMA acting as a scribe for William Graham, MD.  William Vincent is a 57 y.o. male who presents to [facility] today for f/u. Pt was last seen by Dr. [name] on 01/12/22 and was given a L subacromial and R knee steroid injection. His bilat axilla rash was treated w/ erythromycin and topical terbinafine and his chronic pain rx were refilled. Since his last visit, pt has called the office numerous times to refill his pain management medications.   Today, pt reports worsening right knee pain over the past couple of weeks. Mechanical sx and swelling present. The leg goes numb when sitting. Denies new injury. Goes for walks daily. Taking meds as prescribed. Ambulating with cane today.   Dx imaging: 04/03/19 R knee XR  Pertinent review of systems: No fevers or chills  Relevant historical information: Chronic back pain chronic shoulder pain.  Obesity hypertension.   Exam:  BP 120/86   Pulse 79   Ht 5\' 10"  (1.778 m)   Wt 275 lb (124.7 kg)   SpO2 97%   BMI 39.46 kg/m  General: Well Developed, well nourished, and in no acute distress.   MSK: Right knee mild effusion otherwise normal. Normal motion. Tender palpation medial joint line. Stable ligamentous exam.    Lab and Radiology Results  Procedure: Real-time Ultrasound Guided Injection of right knee joint superior lateral patellar space Device: Philips Affiniti 50G Images permanently stored and available for review in PACS Verbal informed consent obtained.  Discussed risks and benefits of procedure. Warned about infection, bleeding, hyperglycemia damage to structures among others. Patient expresses understanding and agreement Time-out conducted.   Noted no overlying erythema, induration, or other signs of local infection.   Skin prepped in a sterile fashion.   Local anesthesia: Topical Ethyl chloride.   With sterile technique and under real time ultrasound guidance: 40 mg of Kenalog and 2 mL  Marcaine injected into knee joint. Fluid seen entering the joint capsule.   Completed without difficulty   Pain immediately resolved suggesting accurate placement of the medication.   Advised to call if fevers/chills, erythema, induration, drainage, or persistent bleeding.   Images permanently stored and available for review in the ultrasound unit.  Impression: Technically successful ultrasound guided injection.         Assessment and Plan: 57 y.o. 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  Plan for steroid injection today.  Recheck back as needed.  We can repeat this injection every 3 months if needed.  At the end the visit he mentioned that his shoulder and other knee were hurting some and was thinking about an injection in a month or 2 if needed.  He can schedule for that whenever he he likes.   PDMP not reviewed this encounter. Orders Placed This Encounter  Procedures   [place] LIMITED JOINT SPACE STRUCTURES LOW RIGHT(NO LINKED CHARGES)    Order Specific Question:   Reason for Exam (SYMPTOM  OR DIAGNOSIS REQUIRED)    Answer:   right knee pain    Order Specific Question:   Preferred imaging location?    Answer:   [facility]   No orders of the defined types were placed in this encounter.    Discussed warning signs or symptoms. Please see discharge instructions. Patient expresses understanding.   The above documentation has been reviewed and is accurate and complete William Vincent, M.D.

## 2022-08-21 ENCOUNTER — Ambulatory Visit: Payer: 59 | Admitting: Family Medicine

## 2022-09-15 ENCOUNTER — Other Ambulatory Visit: Payer: Self-pay

## 2022-09-15 MED ORDER — PREGABALIN 200 MG PO CAPS: ORAL_CAPSULE | ORAL | 2 refills | Status: DC

## 2022-09-15 MED ORDER — HYDROCODONE-ACETAMINOPHEN 5-325 MG PO TABS: 1.0000 | ORAL_TABLET | Freq: Every day | ORAL | 0 refills | Status: DC | PRN

## 2022-09-15 NOTE — Telephone Encounter (Signed)
Patient called wanting a refill on the hydrocodone and the lyrica

## 2022-09-15 NOTE — Addendum Note (Signed)
Addended by: Dierdre Searles on: 09/15/2022 12:32 PM   Modules accepted: Orders

## 2022-10-01 ENCOUNTER — Ambulatory Visit: Payer: 59 | Admitting: Family Medicine

## 2022-10-12 ENCOUNTER — Ambulatory Visit: Payer: 59 | Admitting: Family Medicine

## 2022-10-14 ENCOUNTER — Other Ambulatory Visit: Payer: Self-pay | Admitting: Family Medicine

## 2022-10-14 NOTE — Telephone Encounter (Signed)
Last OV 08/20/22 Next OV 10/26/22  Last refill 09/15/22 Qty #30/0

## 2022-10-14 NOTE — Telephone Encounter (Signed)
Patient called requesting a refill on HYDROcodone-acetaminophen (NORCO/VICODIN) 5-325 MG tablet and methocarbamol (ROBAXIN) 500 MG tablet.  He also asked if Dr [name] would be able to send over additional refill on his naproxen (NAPROSYN) 500 MG tablet.  Pharmacy: [facility] in [place]

## 2022-10-15 MED ORDER — HYDROCODONE-ACETAMINOPHEN 5-325 MG PO TABS: 1.0000 | ORAL_TABLET | Freq: Every day | ORAL | 0 refills | Status: DC | PRN

## 2022-10-15 MED ORDER — METHOCARBAMOL 500 MG PO TABS: 500.0000 mg | ORAL_TABLET | Freq: Three times a day (TID) | ORAL | 0 refills | Status: AC

## 2022-10-15 MED ORDER — NAPROXEN 500 MG PO TABS: 500.0000 mg | ORAL_TABLET | Freq: Two times a day (BID) | ORAL | 3 refills | Status: DC | PRN

## 2022-10-15 NOTE — Telephone Encounter (Signed)
Medication refilled

## 2022-10-26 ENCOUNTER — Ambulatory Visit: Payer: 59 | Admitting: Family Medicine

## 2022-11-16 ENCOUNTER — Other Ambulatory Visit: Payer: Self-pay | Admitting: Family Medicine

## 2022-11-16 MED ORDER — HYDROCODONE-ACETAMINOPHEN 5-325 MG PO TABS: 1.0000 | ORAL_TABLET | Freq: Every day | ORAL | 0 refills | Status: DC | PRN

## 2022-11-16 MED ORDER — PREGABALIN 200 MG PO CAPS: ORAL_CAPSULE | ORAL | 2 refills | Status: DC

## 2022-11-16 NOTE — Telephone Encounter (Signed)
Patient called requesting a refill on: HYDROcodone-acetaminophen (NORCO/VICODIN) 5-325 MG tablet  2.   pregabalin (LYRICA) 200 MG capsule   Pharmacy: [facility] [address]

## 2022-11-16 NOTE — Telephone Encounter (Signed)
Last OV 08/20/22 Next OV - 08/21/22 - canceled                  10/01/22 - canceled                  10/12/22 - canceled                  10/26/22 - canceled  Last refill  Lyrica 09/15/22 Qty #180/6  Hydrocodone 10/15/22 Qty #30/0   Controlled substance, forwarding to Dr. [name].   Rx's pending

## 2022-11-16 NOTE — Telephone Encounter (Signed)
Medications refilled

## 2022-12-14 ENCOUNTER — Telehealth: Payer: Self-pay | Admitting: Family Medicine

## 2022-12-14 NOTE — Telephone Encounter (Signed)
Patient called requesting refill on:  HYDROcodone-acetaminophen (NORCO/VICODIN) 5-325 MG tablet   pregabalin (LYRICA) 200 MG capsule   tiZANidine (ZANAFLEX) 4 MG tablet   Pharmacy: [facility] [address] [place], [name]

## 2022-12-15 MED ORDER — TIZANIDINE HCL 4 MG PO TABS: 4.0000 mg | ORAL_TABLET | Freq: Three times a day (TID) | ORAL | 3 refills | Status: DC | PRN

## 2022-12-15 MED ORDER — PREGABALIN 200 MG PO CAPS: ORAL_CAPSULE | ORAL | 2 refills | Status: DC

## 2022-12-15 MED ORDER — HYDROCODONE-ACETAMINOPHEN 5-325 MG PO TABS: 1.0000 | ORAL_TABLET | Freq: Every day | ORAL | 0 refills | Status: DC | PRN

## 2022-12-15 NOTE — Telephone Encounter (Signed)
Medicine sent 

## 2022-12-15 NOTE — Telephone Encounter (Signed)
Called pt, received message, "your call cannot be completed at this time".

## 2023-01-13 ENCOUNTER — Other Ambulatory Visit: Payer: Self-pay | Admitting: Family Medicine

## 2023-01-13 NOTE — Telephone Encounter (Signed)
Last OV 08/20/22 Next OV 02/11/23  Last refill 12/15/22 Qty #30/0  Controlled substance, forwarding to Dr. [name]

## 2023-01-13 NOTE — Telephone Encounter (Signed)
Per [name], pt needs his Hydrocodone refilled at [facility] in [place].

## 2023-01-14 MED ORDER — HYDROCODONE-ACETAMINOPHEN 5-325 MG PO TABS: 1.0000 | ORAL_TABLET | Freq: Every day | ORAL | 0 refills | Status: DC | PRN

## 2023-01-14 NOTE — Telephone Encounter (Signed)
Called pt and left VM advising that prescription refill has been sent in to the pharmacy and to call back with any questions or concerns.

## 2023-01-14 NOTE — Telephone Encounter (Signed)
done

## 2023-02-11 ENCOUNTER — Ambulatory Visit: Payer: 59 | Admitting: Family Medicine

## 2023-02-12 ENCOUNTER — Telehealth: Payer: Self-pay | Admitting: Family Medicine

## 2023-02-12 MED ORDER — HYDROCODONE-ACETAMINOPHEN 5-325 MG PO TABS: 1.0000 | ORAL_TABLET | Freq: Every day | ORAL | 0 refills | Status: DC | PRN

## 2023-02-12 NOTE — Telephone Encounter (Signed)
Pt wife calling for refill of Hydrocodone, has 2 left and needs refilled today.

## 2023-02-12 NOTE — Telephone Encounter (Signed)
Med refilled.

## 2023-03-12 ENCOUNTER — Ambulatory Visit: Payer: 59 | Admitting: Family Medicine

## 2023-03-16 ENCOUNTER — Telehealth: Payer: Self-pay | Admitting: Family Medicine

## 2023-03-16 NOTE — Telephone Encounter (Signed)
 Patient requesting refills on: pregabalin (LYRICA) 200 MG capsule  HYDROcodone-acetaminophen (NORCO/VICODIN) 5-325 MG tablet   Pharmacy: [facility] [address], [name]

## 2023-03-17 MED ORDER — PREGABALIN 200 MG PO CAPS: ORAL_CAPSULE | ORAL | 2 refills | Status: DC

## 2023-03-17 MED ORDER — HYDROCODONE-ACETAMINOPHEN 5-325 MG PO TABS: 1.0000 | ORAL_TABLET | Freq: Every day | ORAL | 0 refills | Status: DC | PRN

## 2023-03-17 NOTE — Telephone Encounter (Signed)
 Meds refilled.

## 2023-04-12 ENCOUNTER — Telehealth: Payer: Self-pay | Admitting: Family Medicine

## 2023-04-12 MED ORDER — HYDROCODONE-ACETAMINOPHEN 5-325 MG PO TABS: 1.0000 | ORAL_TABLET | Freq: Every day | ORAL | 0 refills | Status: DC | PRN

## 2023-04-12 NOTE — Telephone Encounter (Signed)
Last OV 08/20/22 Next OV scheduled 04/26/23  Last refill 03/17/23 Qty # 30/0  Controlled substance, forwarding to Dr. [name].

## 2023-04-12 NOTE — Telephone Encounter (Signed)
Pt wife called to reschedule both of their appts from 2/5 until 04/26/2023.  Pt wife also requesting a refill of hydrocodone to [facility] [place].

## 2023-04-12 NOTE — Telephone Encounter (Signed)
Hydrocodone refilled.  

## 2023-04-14 ENCOUNTER — Ambulatory Visit: Payer: 59 | Admitting: Family Medicine

## 2023-04-26 ENCOUNTER — Ambulatory Visit: Payer: 59 | Admitting: Family Medicine

## 2023-04-27 ENCOUNTER — Ambulatory Visit: Payer: 59 | Admitting: Family Medicine

## 2023-05-11 ENCOUNTER — Ambulatory Visit (INDEPENDENT_AMBULATORY_CARE_PROVIDER_SITE_OTHER)

## 2023-05-11 ENCOUNTER — Other Ambulatory Visit: Payer: Self-pay

## 2023-05-11 ENCOUNTER — Ambulatory Visit: Payer: 59 | Admitting: Family Medicine

## 2023-05-11 VITALS — BP 152/106 | HR 73 | Ht 70.0 in | Wt 278.0 lb

## 2023-05-11 DIAGNOSIS — G8929 Other chronic pain: Secondary | ICD-10-CM

## 2023-05-11 DIAGNOSIS — M25562 Pain in left knee: Secondary | ICD-10-CM

## 2023-05-11 DIAGNOSIS — M25512 Pain in left shoulder: Secondary | ICD-10-CM

## 2023-05-11 DIAGNOSIS — G894 Chronic pain syndrome: Secondary | ICD-10-CM

## 2023-05-11 DIAGNOSIS — M1712 Unilateral primary osteoarthritis, left knee: Secondary | ICD-10-CM

## 2023-05-11 MED ORDER — CELECOXIB 200 MG PO CAPS: ORAL_CAPSULE | ORAL | 2 refills | Status: AC

## 2023-05-11 MED ORDER — HYDROCODONE-ACETAMINOPHEN 5-325 MG PO TABS: 1.0000 | ORAL_TABLET | Freq: Every day | ORAL | 0 refills | Status: DC | PRN

## 2023-05-11 MED ORDER — DICLOFENAC SODIUM 1 % EX GEL: 4.0000 g | Freq: Four times a day (QID) | CUTANEOUS | Status: DC

## 2023-05-11 NOTE — Patient Instructions (Addendum)
 Thank you for coming in today.   You received an injection today. Seek immediate medical attention if the joint becomes red, extremely painful, or is oozing fluid.   Please get an Xray today before you leave   Your medicine was sent to your pharmacy  Let me know how it goes

## 2023-05-11 NOTE — Progress Notes (Signed)
 Rubin Payor, PhD, LAT, ATC acting as a scribe for [name], MD.  Kylee Nardozzi is a 58 y.o. male who presents to [facility] at [facility] today for L shoulder and L knee pain. Pt was last seen by Dr. [name] on 08/20/22 for his R knee pain that was injected.   Today, pt reports L knee pain ongoing for a couple months. He c/o L knee has been "going out" on him. L shoulder has also been bothering him for awhile.   L Knee swelling: no Mechanical symptoms: yes Aggravates: walking Treatments tried: hydrocodone  He notes continued chronic low back pain.  Dx imaging: 01/27/21 L shoulder XR  Pertinent review of systems: No fevers or chills  Relevant historical information: Obesity.  Spondylolysis L-spine   Exam:  BP (!) 152/106   Pulse 73   Ht 5\' 10"  (1.778 m)   Wt 278 lb (126.1 kg)   SpO2 99%   BMI 39.89 kg/m  General: Well Developed, well nourished, and in no acute distress.   MSK: Left shoulder normal-appearing Normal motion. Tenderness to palpation AC joint. Pain with crossover arm compression test. Intact strength.  Left knee mild effusion normal-appearing otherwise normal motion with crepitation.  L-spine nontender to palpation midline.   Lab and Radiology Results  Procedure: Real-time Ultrasound Guided Injection of left knee joint superior lateral patella space Device: Philips Affiniti 50G/GE Logiq Images permanently stored and available for review in PACS Verbal informed consent obtained.  Discussed risks and benefits of procedure. Warned about infection, bleeding, hyperglycemia damage to structures among others. Patient expresses understanding and agreement Time-out conducted.   Noted no overlying erythema, induration, or other signs of local infection.   Skin prepped in a sterile fashion.   Local anesthesia: Topical Ethyl chloride.   With sterile technique and under real time ultrasound guidance: 40 mg of Kenalog and 2 mL of Marcaine  injected into knee joint. Fluid seen entering the joint capsule.   Completed without difficulty   Pain immediately resolved suggesting accurate placement of the medication.   Advised to call if fevers/chills, erythema, induration, drainage, or persistent bleeding.   Images permanently stored and available for review in the ultrasound unit.  Impression: Technically successful ultrasound guided injection.   Procedure: Real-time Ultrasound Guided Injection of left shoulder AC joint Device: Philips Affiniti 50G/GE Logiq Images permanently stored and available for review in PACS Verbal informed consent obtained.  Discussed risks and benefits of procedure. Warned about infection, bleeding, hyperglycemia damage to structures among others. Patient expresses understanding and agreement Time-out conducted.   Noted no overlying erythema, induration, or other signs of local infection.   Skin prepped in a sterile fashion.   Local anesthesia: Topical Ethyl chloride.   With sterile technique and under real time ultrasound guidance: 40 mg of Kenalog and 1 mL of Marcaine injected into AC joint. Fluid seen entering the joint capsule.   Completed without difficulty   Pain immediately resolved suggesting accurate placement of the medication.   Advised to call if fevers/chills, erythema, induration, drainage, or persistent bleeding.   Images permanently stored and available for review in the ultrasound unit.  Impression: Technically successful ultrasound guided injection.    X-ray images left shoulder and left knee obtained today personally and independently interpreted.  Left shoulder: No acute fractures.  Mild AC DJD.  Otherwise no degenerative changes.  Left knee: Mild medial and patellofemoral DJD.  No acute fractures are visible.  Await formal radiology review  Assessment and Plan: 58 y.o. male with chronic left shoulder pain and chronic left knee pain.  Left shoulder pain is multifactorial  although the majority the pain is located the [facility] joint.  He had pretty good results from injection today.  Consider subacromial glenohumeral injection in the future if needed.    Left knee pain chronic due to DJD exacerbation plan for steroid injection today.  Diclofenac gel refilled.  Chronic back pain ongoing issue.  Hydrocodone and Celebrex refilled.   PDMP reviewed during this encounter. Orders Placed This Encounter  Procedures   [place] LIMITED JOINT SPACE STRUCTURES UP LEFT(NO LINKED CHARGES)    Reason for Exam (SYMPTOM  OR DIAGNOSIS REQUIRED):   left shoulder pain    Preferred imaging location?:   [facility] Knee AP/LAT W/[place] Left    Standing Status:   Future    Number of Occurrences:   1    Expiration Date:   06/11/2023    Reason for Exam (SYMPTOM  OR DIAGNOSIS REQUIRED):   left knee pain    Preferred imaging location?:   [facility]   DG Shoulder Left    Standing Status:   Future    Number of Occurrences:   1    Expiration Date:   06/11/2023    Reason for Exam (SYMPTOM  OR DIAGNOSIS REQUIRED):   left shoulder pain    Preferred imaging location?:   [facility]   Meds ordered this encounter  Medications   HYDROcodone-acetaminophen (NORCO/VICODIN) 5-325 MG tablet    Sig: Take 1 tablet by mouth daily as needed.    Dispense:  30 tablet    Refill:  0    Chronic pain.   celecoxib (CELEBREX) 200 MG capsule    Sig: Take 1 to 2 tablets by mouth daily as needed for pain.    Dispense:  60 capsule    Refill:  2   diclofenac Sodium (VOLTAREN) 1 % topical gel 4 g     Discussed warning signs or symptoms. Please see discharge instructions. Patient expresses understanding.   The above documentation has been reviewed and is accurate and complete [name], M.D.

## 2023-05-24 ENCOUNTER — Telehealth: Payer: Self-pay | Admitting: Family Medicine

## 2023-05-24 ENCOUNTER — Encounter: Payer: Self-pay | Admitting: Family Medicine

## 2023-05-24 MED ORDER — DICLOFENAC SODIUM 1 % EX GEL: 4.0000 g | Freq: Four times a day (QID) | CUTANEOUS | 5 refills | Status: DC

## 2023-05-24 NOTE — Progress Notes (Signed)
 Left knee x-ray shows mild arthritis.

## 2023-05-24 NOTE — Progress Notes (Signed)
 Left shoulder x-ray shows some early arthritis in the shoulder joint.

## 2023-05-24 NOTE — Telephone Encounter (Signed)
 Rx refilled.

## 2023-05-24 NOTE — Telephone Encounter (Signed)
 Patient called requesting a refill on: diclofenac Sodium (VOLTAREN) 1 % topical gel 4 g   Pharmacy: [facility]

## 2023-05-30 ENCOUNTER — Other Ambulatory Visit: Payer: Self-pay | Admitting: Family Medicine

## 2023-05-31 NOTE — Telephone Encounter (Signed)
 Last OV 05/11/23 Next OV not scheduled  Last refill 10/15/22 Qty #60/3  Renal labs have not been checked recently.   Forwarding to Dr. [name].

## 2023-06-15 NOTE — Telephone Encounter (Signed)
 Per William Vincent, Jeffrey needs his hydro and lyrica called into [facility] in [place].

## 2023-06-16 ENCOUNTER — Other Ambulatory Visit: Payer: Self-pay | Admitting: Family Medicine

## 2023-06-16 MED ORDER — HYDROCODONE-ACETAMINOPHEN 5-325 MG PO TABS: 1.0000 | ORAL_TABLET | Freq: Every day | ORAL | 0 refills | Status: DC | PRN

## 2023-06-16 MED ORDER — PREGABALIN 200 MG PO CAPS: ORAL_CAPSULE | ORAL | 2 refills | Status: DC

## 2023-06-16 NOTE — Telephone Encounter (Signed)
 Patient is requesting a refill on: HYDROcodone-acetaminophen (NORCO/VICODIN) 5-325 MG tablet  pregabalin (LYRICA) 200 MG capsule   Pharmacy: [facility] - [place], [name]  *He is completely out and would like this filled as soon as possible.

## 2023-06-16 NOTE — Telephone Encounter (Signed)
 Last OV 05/11/23  Last refill      Hydrocodone-APAP - 05/11/23     Qty # 30/0      Lyrica - 03/17/23     Qty # 180/2   Controlled substances forwarding to Dr. [name].

## 2023-06-29 ENCOUNTER — Other Ambulatory Visit: Payer: Self-pay | Admitting: Family Medicine

## 2023-06-29 NOTE — Telephone Encounter (Signed)
 Last OV 05/11/23 Next OV not scheduled  Last refill 05/13/22 Qty #90/1

## 2023-07-15 ENCOUNTER — Telehealth: Payer: Self-pay | Admitting: Family Medicine

## 2023-07-15 MED ORDER — HYDROCODONE-ACETAMINOPHEN 5-325 MG PO TABS: 1.0000 | ORAL_TABLET | Freq: Every day | ORAL | 0 refills | Status: DC | PRN

## 2023-07-15 NOTE — Telephone Encounter (Signed)
 Pt wife requesting refill of pt hydrocodone  to [facility] in [place].

## 2023-07-15 NOTE — Telephone Encounter (Signed)
 Hydrocodone  sent to [facility] in [place].

## 2023-08-13 ENCOUNTER — Other Ambulatory Visit: Payer: Self-pay | Admitting: Family Medicine

## 2023-08-13 MED ORDER — PREGABALIN 200 MG PO CAPS: ORAL_CAPSULE | ORAL | 2 refills | Status: DC

## 2023-08-13 MED ORDER — HYDROCODONE-ACETAMINOPHEN 5-325 MG PO TABS: 1.0000 | ORAL_TABLET | Freq: Every day | ORAL | 0 refills | Status: DC | PRN

## 2023-08-13 NOTE — Telephone Encounter (Signed)
 Last OV 05/11/23 Next OV not scheduled  Last refill(s) Lyrica  06/16/23  Qty # 180/2  Hydrocodone -APAP 06/16/23 Qty #180/2  Controlled substance, forwarding to Dr. Alease Hunter

## 2023-08-13 NOTE — Telephone Encounter (Signed)
 Pt needs refill of Lyrica  and Hydrocodone  to [facility] in [place].

## 2023-09-14 ENCOUNTER — Telehealth: Payer: Self-pay | Admitting: Family Medicine

## 2023-09-14 NOTE — Telephone Encounter (Signed)
 Per [name], [name] needs refills of  Hydrocodone  Lyrica  Naprosyn  Tizanidine   [employer] [facility]

## 2023-09-14 NOTE — Telephone Encounter (Signed)
 Last OV 05/11/23 Next OV not scheduled  Renal labs 02/04/21  Comprehensive metabolic panel Order: [id number] Component Ref Range & Units 2 yr ago  Sodium 136 - 144 mmol/L 140  Potassium 3.5 - 5.0 mmol/L 4.4  Chloride 98 - 108 mmol/L 107  CO2 22 - 29 mmol/L 20 Low   BUN 8 - 20 mg/dl 19  Creatinine 9.27 - 8.74 mg/dL 8.91  Calcium  8.4 - 10.3 mg/dl 9.4  Albumin (g/dl) In Ser/Plas 3.7 - 4.8 g/dL 3.9  Protein 6.5 - 8.3 g/dL 7.3  Alkaline Phosphatase 45 - 120 U/L 107  ALT (SGPT) 0 - 63 U/L 79 High   AST 10 - 41 U/L 59 High   Total Bilirubin 0.0 - 1.2 mg/dl 0.7  Glucose 70 - 859 mg/dL 90  Comment: Glucose Reference Range:     Random:            70-140 mg/dL     Fasting:           70-99 mg/dL     Fasting Impaired:  100-125 mg/dL     Fasting Sugg. DM:  >125 mg/dL  Normal fasting glucose is less than 100 mg/dL; an impaired fasting glucose is 100-125mg /dL.  A fasting glucose repeatedly greater than 125 is characteristic of diabetes mellitus.  Anion Gap 7.0 - 16.0 mmol/L 13.0  eGFR >=60.0 mL/min/1.79m*2 >60.0  Comment: This calculation was updated on 07/09/2020 and no longer includes a race coefficient.  Reported eGFR calculation is based on the CKD-EPI 2021 equation that does not use a race coefficient. eGFR Interpretations:   >= 90 ml/min/1.9m2 Normal (G1)   60-89 ml/min/1.67m2 Mildly decreased kidney function (G2)   45-59 ml/min/1.41m2 Mildly to moderately decreased kidney function (G3a)   30-44 ml/min/1.110m2 Moderately to severely decreased kidney function (G3b)   15-29 ml/min/1.67m2 Severely decreased kidney function (G4)   < 15 ml/min/1.75m2 Kidney failure (G5)

## 2023-09-15 MED ORDER — PREGABALIN 200 MG PO CAPS: ORAL_CAPSULE | ORAL | 2 refills | Status: DC

## 2023-09-15 MED ORDER — NAPROXEN 500 MG PO TABS: 500.0000 mg | ORAL_TABLET | Freq: Two times a day (BID) | ORAL | 3 refills | Status: DC

## 2023-09-15 MED ORDER — HYDROCODONE-ACETAMINOPHEN 5-325 MG PO TABS: 1.0000 | ORAL_TABLET | Freq: Every day | ORAL | 0 refills | Status: DC | PRN

## 2023-09-15 MED ORDER — TIZANIDINE HCL 4 MG PO TABS: 4.0000 mg | ORAL_TABLET | Freq: Three times a day (TID) | ORAL | 3 refills | Status: DC | PRN

## 2023-09-15 NOTE — Telephone Encounter (Signed)
 Medication sent to pharmacy

## 2023-09-15 NOTE — Telephone Encounter (Signed)
 Called wife, [name], and advised that refills have been sent in.

## 2023-10-14 ENCOUNTER — Telehealth: Payer: Self-pay | Admitting: Family Medicine

## 2023-10-14 ENCOUNTER — Other Ambulatory Visit: Payer: Self-pay | Admitting: Sports Medicine

## 2023-10-14 DIAGNOSIS — M170 Bilateral primary osteoarthritis of knee: Secondary | ICD-10-CM

## 2023-10-14 DIAGNOSIS — G8929 Other chronic pain: Secondary | ICD-10-CM

## 2023-10-14 MED ORDER — HYDROCODONE-ACETAMINOPHEN 5-325 MG PO TABS: 1.0000 | ORAL_TABLET | Freq: Every day | ORAL | 0 refills | Status: DC | PRN

## 2023-10-14 NOTE — Telephone Encounter (Signed)
 Patient called requesting a refill on: HYDROcodone -acetaminophen  (NORCO/VICODIN) 5-325 MG tablet   Pharmacy - [facility] [place]

## 2023-10-14 NOTE — Progress Notes (Signed)
 Will provide short-term medication refill while original prescribing provider is out of office. All future refills should be addressed by original prescribing provider. Thank you.

## 2023-11-01 ENCOUNTER — Telehealth: Payer: Self-pay | Admitting: Family Medicine

## 2023-11-01 MED ORDER — DICLOFENAC SODIUM 1 % EX GEL: 4.0000 g | Freq: Four times a day (QID) | CUTANEOUS | 5 refills | Status: AC

## 2023-11-01 NOTE — Telephone Encounter (Signed)
Diclofenac gel refilled

## 2023-11-01 NOTE — Telephone Encounter (Signed)
 Pt wife states pt needs the Cream he uses for hand/knee pain. Unsure of name, Voltaren  in history.  [facility] [place]

## 2023-11-12 ENCOUNTER — Other Ambulatory Visit: Payer: Self-pay | Admitting: Family Medicine

## 2023-11-12 DIAGNOSIS — M545 Low back pain, unspecified: Secondary | ICD-10-CM

## 2023-11-12 MED ORDER — HYDROCODONE-ACETAMINOPHEN 5-325 MG PO TABS: 1.0000 | ORAL_TABLET | Freq: Every day | ORAL | 0 refills | Status: DC | PRN

## 2023-11-12 NOTE — Telephone Encounter (Signed)
 Last OV 05/11/23  Last refill 10/14/23 Qty #30/0

## 2023-11-12 NOTE — Telephone Encounter (Signed)
 Patient called requesting a refill on: HYDROcodone -acetaminophen  (NORCO/VICODIN) 5-325 MG tablet   Pharmacy - [facility] [place]

## 2023-12-09 ENCOUNTER — Telehealth: Payer: Self-pay | Admitting: Family Medicine

## 2023-12-09 DIAGNOSIS — G8929 Other chronic pain: Secondary | ICD-10-CM

## 2023-12-09 NOTE — Telephone Encounter (Signed)
 Patient called requesting a refill on: HYDROcodone -acetaminophen  (NORCO/VICODIN) 5-325 MG tablet   Pharmacy: [facility] [place]

## 2023-12-10 MED ORDER — HYDROCODONE-ACETAMINOPHEN 5-325 MG PO TABS: 1.0000 | ORAL_TABLET | Freq: Every day | ORAL | 0 refills | Status: DC | PRN

## 2023-12-10 NOTE — Telephone Encounter (Signed)
 Medicine refilled.

## 2023-12-30 ENCOUNTER — Other Ambulatory Visit: Payer: Self-pay | Admitting: Family Medicine

## 2023-12-30 NOTE — Telephone Encounter (Signed)
 Last OV 05/11/23 Next OV not scheduled   Last refill 09/15/23 Qty #60/3   Renal labs 11/05/23  Comprehensive metabolic panel Order: [id number] Component Ref Range & Units 1 mo ago  Sodium 136 - 144 mmol/L 139  Potassium 3.5 - 5.0 mmol/L 4.8  Chloride 98 - 108 mmol/L 104  CO2 22 - 29 mmol/L 26  BUN 8 - 20 mg/dl 14  Creatinine 9.99 - 8.70 mg/dL 8.82  Calcium  8.7 - 10.7 mg/dl 9.6  Albumin (g/dl) In Ser/Plas 3.5 - 5.0 g/dL 4.4  Protein 6.4 - 8.3 g/dL 7.3  Alkaline Phosphatase 45 - 120 U/L 62  ALT (SGPT) 0 - 55 U/L 25  AST 0 - 40 U/L 28  Total Bilirubin 0.3 - 1.2 mg/dl 0.9  Glucose 70 - 859 mg/dL 897  Comment: Glucose Reference Range:     Random:            70-140 mg/dL     Fasting:           70-99 mg/dL     Fasting Impaired:  100-125 mg/dL     Fasting Sugg. DM:  >125 mg/dL  Normal fasting glucose is less than 100 mg/dL; an impaired fasting glucose is 100-125mg /dL.  A fasting glucose repeatedly greater than 125 is characteristic of diabetes mellitus.  Anion Gap 7.0 - 16.0 mmol/L 9  eGFR >=60.0 mL/min/1.5m*2 73.2

## 2024-01-12 ENCOUNTER — Other Ambulatory Visit: Payer: Self-pay | Admitting: Family Medicine

## 2024-01-12 DIAGNOSIS — G8929 Other chronic pain: Secondary | ICD-10-CM

## 2024-01-12 NOTE — Telephone Encounter (Signed)
 Patient called requesting refills on the following: HYDROcodone -acetaminophen  (NORCO/VICODIN) 5-325 MG tablet  2.   tiZANidine  (ZANAFLEX ) 4 MG tablet  3.   naproxen  (NAPROSYN ) 500 MG tablet  4.   pregabalin  (LYRICA ) 200 MG capsule   Pharmacy: [facility] - [place], [place]

## 2024-01-12 NOTE — Telephone Encounter (Signed)
 Last OV 05/11/23 Next OV not scheduled  Last refill(s) Hydrocodone  12/10/23 Qty #30/0  Naproxen  12/30/23 Qty #60/0  Lyrica  09/15/23 Qty #180/2  Tizanidine  09/15/23 Qty #90/3   Renal labs 11/05/23  Component Ref Range & Units 2 mo ago  Sodium 136 - 144 mmol/L 139  Potassium 3.5 - 5.0 mmol/L 4.8  Chloride 98 - 108 mmol/L 104  CO2 22 - 29 mmol/L 26  BUN 8 - 20 mg/dl 14  Creatinine 9.99 - 8.70 mg/dL 8.82  Calcium  8.7 - 10.7 mg/dl 9.6  Albumin (g/dl) In Ser/Plas 3.5 - 5.0 g/dL 4.4  Protein 6.4 - 8.3 g/dL 7.3  Alkaline Phosphatase 45 - 120 U/L 62  ALT (SGPT) 0 - 55 U/L 25  AST 0 - 40 U/L 28  Total Bilirubin 0.3 - 1.2 mg/dl 0.9  Glucose 70 - 859 mg/dL 897  Comment: Glucose Reference Range:     Random:            70-140 mg/dL     Fasting:           70-99 mg/dL     Fasting Impaired:  100-125 mg/dL     Fasting Sugg. DM:  >125 mg/dL  Normal fasting glucose is less than 100 mg/dL; an impaired fasting glucose is 100-125mg /dL.  A fasting glucose repeatedly greater than 125 is characteristic of diabetes mellitus.  Anion Gap 7.0 - 16.0 mmol/L 9  eGFR >=60.0 mL/min/1.49m*2 73.2

## 2024-01-13 MED ORDER — HYDROCODONE-ACETAMINOPHEN 5-325 MG PO TABS: 1.0000 | ORAL_TABLET | Freq: Every day | ORAL | 0 refills | Status: DC | PRN

## 2024-01-13 MED ORDER — PREGABALIN 200 MG PO CAPS: ORAL_CAPSULE | ORAL | 5 refills | Status: DC

## 2024-01-13 MED ORDER — TIZANIDINE HCL 4 MG PO TABS: 4.0000 mg | ORAL_TABLET | Freq: Three times a day (TID) | ORAL | 2 refills | Status: AC | PRN

## 2024-01-13 MED ORDER — NAPROXEN 500 MG PO TABS
500.0000 mg | ORAL_TABLET | Freq: Two times a day (BID) | ORAL | 2 refills | Status: AC | PRN
Start: 2024-01-13 — End: ?

## 2024-01-31 ENCOUNTER — Other Ambulatory Visit: Payer: Self-pay | Admitting: Family Medicine

## 2024-01-31 NOTE — Telephone Encounter (Signed)
 Rx refill request approved per Dr. Zollie Pee orders.

## 2024-02-09 ENCOUNTER — Other Ambulatory Visit: Payer: Self-pay | Admitting: Family Medicine

## 2024-02-09 DIAGNOSIS — G8929 Other chronic pain: Secondary | ICD-10-CM

## 2024-02-09 NOTE — Telephone Encounter (Addendum)
 Last OV 05/11/23 Next OV NOT scheduled  Last refill 01/13/24 Qty #30/0

## 2024-02-09 NOTE — Telephone Encounter (Signed)
 Patient called requesting a refill on: HYDROcodone -acetaminophen  (NORCO/VICODIN) 5-325 MG tablet   Pharmacy - [facility] [place]

## 2024-02-10 MED ORDER — HYDROCODONE-ACETAMINOPHEN 5-325 MG PO TABS: 1.0000 | ORAL_TABLET | Freq: Every day | ORAL | 0 refills | Status: DC | PRN

## 2024-03-10 ENCOUNTER — Telehealth: Payer: Self-pay | Admitting: Family Medicine

## 2024-03-10 DIAGNOSIS — M545 Low back pain, unspecified: Secondary | ICD-10-CM

## 2024-03-10 NOTE — Telephone Encounter (Signed)
 Patient called requesting a refill on: HYDROcodone -acetaminophen  (NORCO/VICODIN) 5-325 MG tablet   Pharmacy: [facility] [place]   **Patient will be out on Monday.

## 2024-03-13 MED ORDER — HYDROCODONE-ACETAMINOPHEN 5-325 MG PO TABS: 1.0000 | ORAL_TABLET | Freq: Every day | ORAL | 0 refills | Status: DC | PRN

## 2024-03-13 NOTE — Telephone Encounter (Signed)
 Patient called back in regards to refill. He is now out of this medication.

## 2024-03-13 NOTE — Telephone Encounter (Signed)
 Medications refilled

## 2024-04-11 ENCOUNTER — Telehealth: Payer: Self-pay | Admitting: Family Medicine

## 2024-04-11 DIAGNOSIS — G8929 Other chronic pain: Secondary | ICD-10-CM

## 2024-04-11 MED ORDER — PREGABALIN 200 MG PO CAPS: ORAL_CAPSULE | ORAL | 5 refills | Status: AC

## 2024-04-11 MED ORDER — HYDROCODONE-ACETAMINOPHEN 5-325 MG PO TABS: 1.0000 | ORAL_TABLET | Freq: Every day | ORAL | 0 refills | Status: AC | PRN

## 2024-04-11 NOTE — Telephone Encounter (Signed)
 Done
